# Patient Record
Sex: Female | Born: 1990 | Race: Black or African American | Hispanic: No | Marital: Single | Smoking: Current some day smoker
Health system: Southern US, Community
[De-identification: ages and names within clinical notes are randomized; demographics above are authoritative.]

## PROBLEM LIST (undated history)

## (undated) ENCOUNTER — Emergency Department (HOSPITAL_COMMUNITY): Payer: Self-pay

## (undated) ENCOUNTER — Inpatient Hospital Stay (HOSPITAL_COMMUNITY): Payer: Self-pay

## (undated) DIAGNOSIS — E282 Polycystic ovarian syndrome: Secondary | ICD-10-CM

## (undated) DIAGNOSIS — R0602 Shortness of breath: Secondary | ICD-10-CM

## (undated) DIAGNOSIS — E669 Obesity, unspecified: Secondary | ICD-10-CM

## (undated) DIAGNOSIS — Z789 Other specified health status: Secondary | ICD-10-CM

## (undated) DIAGNOSIS — R600 Localized edema: Secondary | ICD-10-CM

## (undated) DIAGNOSIS — I10 Essential (primary) hypertension: Secondary | ICD-10-CM

## (undated) DIAGNOSIS — G4730 Sleep apnea, unspecified: Secondary | ICD-10-CM

## (undated) DIAGNOSIS — M549 Dorsalgia, unspecified: Secondary | ICD-10-CM

## (undated) DIAGNOSIS — O139 Gestational [pregnancy-induced] hypertension without significant proteinuria, unspecified trimester: Secondary | ICD-10-CM

## (undated) HISTORY — DX: Polycystic ovarian syndrome: E28.2

## (undated) HISTORY — PX: TONSILLECTOMY: SUR1361

## (undated) HISTORY — DX: Localized edema: R60.0

## (undated) HISTORY — DX: Shortness of breath: R06.02

## (undated) HISTORY — DX: Essential (primary) hypertension: I10

## (undated) HISTORY — PX: ADENOIDECTOMY: SUR15

## (undated) HISTORY — DX: Sleep apnea, unspecified: G47.30

## (undated) HISTORY — DX: Dorsalgia, unspecified: M54.9

## (undated) HISTORY — DX: Obesity, unspecified: E66.9

## (undated) HISTORY — DX: Gestational (pregnancy-induced) hypertension without significant proteinuria, unspecified trimester: O13.9

---
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
## 2010-06-06 LAB — WET PREP, GENITAL
Trich, Wet Prep: NONE SEEN
Yeast Wet Prep HPF POC: NONE SEEN

## 2010-06-06 LAB — GC/CHLAMYDIA PROBE AMP, GENITAL
Chlamydia, DNA Probe: POSITIVE — AB
GC Probe Amp, Genital: NEGATIVE

## 2010-06-06 LAB — POCT URINALYSIS DIP (DEVICE)
Glucose, UA: NEGATIVE mg/dL
Hgb urine dipstick: NEGATIVE
Nitrite: NEGATIVE
Protein, ur: 30 mg/dL — AB
Specific Gravity, Urine: >=1.03 (ref 1.005–1.030)
Urobilinogen, UA: 0.2 mg/dL (ref 0.0–1.0)
pH: 5.5 (ref 5.0–8.0)

## 2010-06-06 LAB — POCT PREGNANCY, URINE: Preg Test, Ur: NEGATIVE

## 2010-06-25 LAB — POCT URINALYSIS DIP (DEVICE)
Glucose, UA: NEGATIVE mg/dL
Ketones, ur: 40 mg/dL — AB
Nitrite: NEGATIVE
Protein, ur: 100 mg/dL — AB
Specific Gravity, Urine: >=1.03 (ref 1.005–1.030)
Urobilinogen, UA: 0.2 mg/dL (ref 0.0–1.0)
pH: 5.5 (ref 5.0–8.0)

## 2010-06-25 LAB — POCT PREGNANCY, URINE: Preg Test, Ur: NEGATIVE

## 2010-06-28 LAB — WET PREP, GENITAL
Clue Cells Wet Prep HPF POC: NONE SEEN
Trich, Wet Prep: NONE SEEN
WBC, Wet Prep HPF POC: NONE SEEN
Yeast Wet Prep HPF POC: NONE SEEN

## 2010-06-28 LAB — POCT URINALYSIS DIP (DEVICE)
Glucose, UA: NEGATIVE mg/dL
Ketones, ur: NEGATIVE mg/dL
Nitrite: NEGATIVE
Protein, ur: NEGATIVE mg/dL
Specific Gravity, Urine: 1.025 (ref 1.005–1.030)
Urobilinogen, UA: 0.2 mg/dL (ref 0.0–1.0)
pH: 5.5 (ref 5.0–8.0)

## 2010-06-28 LAB — POCT PREGNANCY, URINE: Preg Test, Ur: NEGATIVE

## 2010-06-28 LAB — GC/CHLAMYDIA PROBE AMP, GENITAL
Chlamydia, DNA Probe: NEGATIVE
GC Probe Amp, Genital: NEGATIVE

## 2010-12-17 ENCOUNTER — Inpatient Hospital Stay (INDEPENDENT_AMBULATORY_CARE_PROVIDER_SITE_OTHER)
Admission: RE | Admit: 2010-12-17 | Discharge: 2010-12-17 | Disposition: A | Discharge status: Home / Self Care | Payer: Self-pay | Source: Ambulatory Visit | Attending: Family Medicine | Admitting: Family Medicine

## 2010-12-17 DIAGNOSIS — N760 Acute vaginitis: Secondary | ICD-10-CM

## 2010-12-17 LAB — WET PREP, GENITAL
Clue Cells Wet Prep HPF POC: NONE SEEN
Yeast Wet Prep HPF POC: NONE SEEN

## 2010-12-20 LAB — GC/CHLAMYDIA PROBE AMP, GENITAL
Chlamydia, DNA Probe: POSITIVE — AB
GC Probe Amp, Genital: NEGATIVE

## 2010-12-28 LAB — POCT URINALYSIS DIP (DEVICE)
Bilirubin Urine: NEGATIVE
Glucose, UA: NEGATIVE
Ketones, ur: NEGATIVE
Nitrite: NEGATIVE
Operator id: 235561
Protein, ur: NEGATIVE
Specific Gravity, Urine: >=1.03
Urobilinogen, UA: 0.2
pH: 5.5

## 2010-12-28 LAB — WET PREP, GENITAL
Clue Cells Wet Prep HPF POC: NONE SEEN
Trich, Wet Prep: NONE SEEN
WBC, Wet Prep HPF POC: NONE SEEN
Yeast Wet Prep HPF POC: NONE SEEN

## 2010-12-28 LAB — POCT PREGNANCY, URINE
Operator id: 235561
Preg Test, Ur: NEGATIVE

## 2010-12-28 LAB — GC/CHLAMYDIA PROBE AMP, GENITAL
Chlamydia, DNA Probe: NEGATIVE
GC Probe Amp, Genital: NEGATIVE

## 2010-12-30 LAB — GC/CHLAMYDIA PROBE AMP, GENITAL
Chlamydia, DNA Probe: POSITIVE — AB
GC Probe Amp, Genital: NEGATIVE

## 2010-12-30 LAB — POCT PREGNANCY, URINE
Operator id: 235561
Preg Test, Ur: NEGATIVE

## 2010-12-30 LAB — WET PREP, GENITAL
Clue Cells Wet Prep HPF POC: NONE SEEN
Trich, Wet Prep: NONE SEEN
Yeast Wet Prep HPF POC: NONE SEEN

## 2010-12-30 LAB — POCT URINALYSIS DIP (DEVICE)
Glucose, UA: NEGATIVE
Hgb urine dipstick: NEGATIVE
Ketones, ur: NEGATIVE
Nitrite: NEGATIVE
Operator id: 235561
Protein, ur: NEGATIVE
Specific Gravity, Urine: 1.02
Urobilinogen, UA: 1
pH: 7

## 2011-09-08 ENCOUNTER — Encounter (HOSPITAL_COMMUNITY): Payer: Self-pay | Admitting: Emergency Medicine

## 2011-09-08 ENCOUNTER — Emergency Department (HOSPITAL_COMMUNITY)
Admission: EM | Admit: 2011-09-08 | Discharge: 2011-09-09 | Disposition: A | Discharge status: Home / Self Care | Payer: Self-pay | Attending: Emergency Medicine | Admitting: Emergency Medicine

## 2011-09-08 DIAGNOSIS — IMO0001 Reserved for inherently not codable concepts without codable children: Secondary | ICD-10-CM | POA: Insufficient documentation

## 2011-09-08 DIAGNOSIS — R110 Nausea: Secondary | ICD-10-CM

## 2011-09-08 DIAGNOSIS — R51 Headache: Secondary | ICD-10-CM | POA: Insufficient documentation

## 2011-09-08 DIAGNOSIS — R519 Headache, unspecified: Secondary | ICD-10-CM

## 2011-09-08 LAB — DIFFERENTIAL
Basophils Absolute: 0 10*3/uL (ref 0.0–0.1)
Basophils Relative: 0 % (ref 0–1)
Eosinophils Absolute: 0 10*3/uL (ref 0.0–0.7)
Eosinophils Relative: 0 % (ref 0–5)
Lymphocytes Relative: 24 % (ref 12–46)
Lymphs Abs: 1.9 10*3/uL (ref 0.7–4.0)
Monocytes Absolute: 0.6 10*3/uL (ref 0.1–1.0)
Monocytes Relative: 7 % (ref 3–12)
Neutro Abs: 5.4 10*3/uL (ref 1.7–7.7)
Neutrophils Relative %: 68 % (ref 43–77)

## 2011-09-08 LAB — BASIC METABOLIC PANEL
BUN: 9 mg/dL (ref 6–23)
CO2: 25 mEq/L (ref 19–32)
Calcium: 9.3 mg/dL (ref 8.4–10.5)
Chloride: 100 mEq/L (ref 96–112)
Creatinine, Ser: 0.8 mg/dL (ref 0.50–1.10)
GFR calc Af Amer: >90 mL/min (ref >90)
GFR calc non Af Amer: >90 mL/min (ref >90)
Glucose, Bld: 92 mg/dL (ref 70–99)
Potassium: 4 mEq/L (ref 3.5–5.1)
Sodium: 136 mEq/L (ref 135–145)

## 2011-09-08 LAB — URINALYSIS, ROUTINE W REFLEX MICROSCOPIC
Bilirubin Urine: NEGATIVE
Glucose, UA: NEGATIVE mg/dL
Hgb urine dipstick: NEGATIVE
Ketones, ur: NEGATIVE mg/dL
Nitrite: NEGATIVE
Protein, ur: NEGATIVE mg/dL
Specific Gravity, Urine: 1.03 (ref 1.005–1.030)
Urobilinogen, UA: 1 mg/dL (ref 0.0–1.0)
pH: 7.5 (ref 5.0–8.0)

## 2011-09-08 LAB — CBC
HCT: 40.2 % (ref 36.0–46.0)
Hemoglobin: 13.5 g/dL (ref 12.0–15.0)
MCH: 28.7 pg (ref 26.0–34.0)
MCHC: 33.6 g/dL (ref 30.0–36.0)
MCV: 85.5 fL (ref 78.0–100.0)
Platelets: 262 10*3/uL (ref 150–400)
RBC: 4.7 MIL/uL (ref 3.87–5.11)
RDW: 13.6 % (ref 11.5–15.5)
WBC: 8 10*3/uL (ref 4.0–10.5)

## 2011-09-08 LAB — POCT PREGNANCY, URINE: Preg Test, Ur: NEGATIVE

## 2011-09-08 LAB — URINE MICROSCOPIC-ADD ON

## 2011-09-08 NOTE — ED Notes (Signed)
Patient C/O having a pounding headache and lightheadedness that began yesterday. States that she has been taking ibuprofen but the headache persists. Her head hurts worse when she stands.  States that she has no history of headaches.  Also C/O periodic, sharp abdominal pain.

## 2011-09-08 NOTE — ED Notes (Signed)
PT. REPORTS HEADACHE / LIGHTHEADED WITH CHILLS AND LOW ABDOMINAL CRAMPING ONSET YESTERDAY.

## 2011-09-08 NOTE — ED Provider Notes (Signed)
History     CSN: [id number]  Arrival date & time 09/08/11  2034   First MD Initiated Contact with Patient 09/08/11 2257      Chief Complaint  Patient presents with  . Headache    (Consider location/radiation/quality/duration/timing/severity/associated sxs/prior treatment) HPI Comments: Patient with a significant past medical history presents emergency department with chief complaint of headache.  Patient states that she normally does not suffer from headaches and she's only had one or 2 migraines before in the past.  Headache onset was gradual E. last evening around 6 p.m. and has been intermittent coming and going ever sense.  Headache is located diffusely throughout head and described as a throbbing sensation rated at a 5/10 in severity.  Headache is non-positional and patient is unaware of any exacerbating factors including lying down.  Patient denies fevers, night sweats, chills, change in vision, chest pain, shortness of breath, lightheadedness, syncope.  In addition patient states that she has had myalgias, chills, and fatigue since yesterday.  She feels as though "I'm coming down with something" patient denies a cough, congestion, or ear pain.  Patient is a 21 y.o. female presenting with headaches. The history is provided by the patient.  Headache  Pertinent negatives include no fever and no shortness of breath.    History reviewed. No pertinent past medical history.  Past Surgical History  Procedure Date  . Tonsillectomy     No family history on file.  History  Substance Use Topics  . Smoking status: Never Smoker   . Smokeless tobacco: Not on file  . Alcohol Use: No    OB History    Grav Para Term Preterm Abortions TAB SAB Ect Mult Living                  Review of Systems  Constitutional: Positive for chills and fatigue. Negative for fever and appetite change.  HENT: Negative for congestion.   Eyes: Negative for visual disturbance.  Respiratory: Negative for  shortness of breath.   Cardiovascular: Negative for chest pain and leg swelling.  Gastrointestinal: Negative for abdominal pain.  Genitourinary: Negative for dysuria, urgency and frequency.  Musculoskeletal: Positive for myalgias.  Neurological: Positive for headaches. Negative for dizziness, syncope, weakness, light-headedness and numbness.  Psychiatric/Behavioral: Negative for confusion.  All other systems reviewed and are negative.    Allergies  Review of patient's allergies indicates no known allergies.  Home Medications   Current Outpatient Rx  Name Route Sig Dispense Refill  . IBUPROFEN 200 MG PO TABS Oral Take 400 mg by mouth every 6 (six) hours as needed. For pain      BP 173/76  Pulse 74  Temp 100.2 F (37.9 C)  Resp 14  SpO2 98%  LMP 08/23/2011  Physical Exam  Constitutional: She is oriented to person, place, and time. She appears well-developed and well-nourished. No distress.  HENT:  Head: Normocephalic and atraumatic.  Mouth/Throat: Oropharynx is clear and moist. No oropharyngeal exudate.  Eyes: Conjunctivae and EOM are normal. Pupils are equal, round, and reactive to light. No scleral icterus.  Neck:       Supple full pain free range of motion of neck, flexion and extension without difficulty, nontender to palpation  Cardiovascular:       Regular rate rhythm, no aberrancy to auscultation  Pulmonary/Chest: Effort normal and breath sounds normal.       Lungs clear auscultation bilaterally.  Abdominal: Soft.  Musculoskeletal: Normal range of motion. She exhibits no edema and  no tenderness.  Neurological: She is alert and oriented to person, place, and time.       Cranial nerves III through XII intact, normal coordination, strength 5/5 bilaterally, gait normal without ataxia  Skin: Skin is warm and dry. No rash noted. She is not diaphoretic. No erythema. No pallor.  Psychiatric: She has a normal mood and affect. Her behavior is normal.    ED Course    Procedures (including critical care time)  Labs Reviewed  URINALYSIS, ROUTINE W REFLEX MICROSCOPIC - Abnormal; Notable for the following:    APPearance CLOUDY (*)     Leukocytes, UA TRACE (*)     All other components within normal limits  URINE MICROSCOPIC-ADD ON - Abnormal; Notable for the following:    Squamous Epithelial / LPF FEW (*)     All other components within normal limits  BASIC METABOLIC PANEL  CBC  DIFFERENTIAL  POCT PREGNANCY, URINE   No results found.   No diagnosis found.    MDM  HA, myalgias  Pt HA treated and improved while in ED.  Presentation is non concerning for [facility], ICH, Meningitis, or temporal arteritis, intermittent with pain free periods. Pt is afebrile with no focal neuro deficits, nuchal rigidity, or change in vision. Pt is to follow up with PCP to discuss prophylactic medication. Pt verbalizes understanding and is agreeable with plan to dc.          Jaci Carrel, [place] 09/09/11 [phone]

## 2011-09-09 MED ORDER — KETOROLAC TROMETHAMINE 60 MG/2ML IM SOLN
60.0000 mg | Freq: Once | INTRAMUSCULAR | Status: AC
  Administered 2011-09-09: 60 mg via INTRAMUSCULAR
  Filled 2011-09-09: qty 2

## 2011-09-09 MED ORDER — METOCLOPRAMIDE HCL 5 MG/ML IJ SOLN
10.0000 mg | Freq: Once | INTRAMUSCULAR | Status: AC
  Administered 2011-09-09: 10 mg via INTRAMUSCULAR
  Filled 2011-09-09: qty 2

## 2011-09-09 NOTE — ED Provider Notes (Signed)
Medical screening examination/treatment/procedure(s) were performed by non-physician practitioner and as supervising physician I was immediately available for consultation/collaboration.   Carleene Cooper III, MD 09/09/11 1346

## 2013-03-24 ENCOUNTER — Emergency Department (HOSPITAL_COMMUNITY): Payer: Self-pay

## 2013-03-24 ENCOUNTER — Emergency Department (HOSPITAL_COMMUNITY)
Admission: EM | Admit: 2013-03-24 | Discharge: 2013-03-24 | Disposition: A | Discharge status: Home / Self Care | Payer: Self-pay | Attending: Emergency Medicine | Admitting: Emergency Medicine

## 2013-03-24 ENCOUNTER — Encounter (HOSPITAL_COMMUNITY): Payer: Self-pay | Admitting: Emergency Medicine

## 2013-03-24 DIAGNOSIS — Z3202 Encounter for pregnancy test, result negative: Secondary | ICD-10-CM | POA: Insufficient documentation

## 2013-03-24 DIAGNOSIS — R51 Headache: Secondary | ICD-10-CM | POA: Insufficient documentation

## 2013-03-24 DIAGNOSIS — H53149 Visual discomfort, unspecified: Secondary | ICD-10-CM | POA: Insufficient documentation

## 2013-03-24 DIAGNOSIS — R350 Frequency of micturition: Secondary | ICD-10-CM | POA: Insufficient documentation

## 2013-03-24 DIAGNOSIS — R109 Unspecified abdominal pain: Secondary | ICD-10-CM | POA: Insufficient documentation

## 2013-03-24 LAB — URINALYSIS W MICROSCOPIC + REFLEX CULTURE
Bilirubin Urine: NEGATIVE
Glucose, UA: NEGATIVE mg/dL
Hgb urine dipstick: NEGATIVE
Ketones, ur: NEGATIVE mg/dL
Leukocytes, UA: NEGATIVE
Nitrite: NEGATIVE
Protein, ur: NEGATIVE mg/dL
Specific Gravity, Urine: 1.027 (ref 1.005–1.030)
Urobilinogen, UA: 1 mg/dL (ref 0.0–1.0)
pH: 7 (ref 5.0–8.0)

## 2013-03-24 LAB — POCT PREGNANCY, URINE: Preg Test, Ur: NEGATIVE

## 2013-03-24 MED ORDER — IBUPROFEN 800 MG PO TABS: 800.0000 mg | ORAL_TABLET | Freq: Three times a day (TID) | ORAL | Status: DC

## 2013-03-24 MED ORDER — CYCLOBENZAPRINE HCL 10 MG PO TABS: 10.0000 mg | ORAL_TABLET | Freq: Two times a day (BID) | ORAL | Status: DC | PRN

## 2013-03-24 NOTE — ED Notes (Signed)
Pt c/o right flank pain that radiates to upper back between shoulder blades and right chest.  "Uncomfortable- sharp feeling".  Denies any nausea/vomiting.  Denies any difficulty with urination.  Migraine headache with light sensitivity on Thursday night prior to symptoms.  Denies headache right now.

## 2013-03-24 NOTE — ED Notes (Signed)
Pt reports R flank pain for past 3 days. Denies dysuria, states pain comes and goes.

## 2013-03-24 NOTE — Discharge Instructions (Signed)
Take ibuprofen for pain. Try heating pads. Rest. Flexeril for spasms. Do not take flexeril if driving or going to work, it may make you drowsy. Follow up with primary care doctor.    Flank Pain Flank pain refers to pain that is located on the side of the body between the upper abdomen and the back. The pain may occur over a short period of time (acute) or may be long-term or reoccurring (chronic). It may be mild or severe. Flank pain can be caused by many things. CAUSES  Some of the more common causes of flank pain include:  Muscle strains.   Muscle spasms.   A disease of your spine (vertebral disk disease).   A lung infection (pneumonia).   Fluid around your lungs (pulmonary edema).   A kidney infection.   Kidney stones.   A very painful skin rash caused by the chickenpox virus (shingles).   Gallbladder disease.  HOME CARE INSTRUCTIONS  Home care will depend on the cause of your pain. In general,  Rest as directed by your caregiver.  Drink enough fluids to keep your urine clear or pale yellow.  Only take over-the-counter or prescription medicines as directed by your caregiver. Some medicines may help relieve the pain.  Tell your caregiver about any changes in your pain.  Follow up with your caregiver as directed. SEEK IMMEDIATE MEDICAL CARE IF:   Your pain is not controlled with medicine.   You have new or worsening symptoms.  Your pain increases.   You have abdominal pain.   You have shortness of breath.   You have persistent nausea or vomiting.   You have swelling in your abdomen.   You feel faint or pass out.   You have blood in your urine.  You have a fever or persistent symptoms for more than 2 3 days.  You have a fever and your symptoms suddenly get worse. MAKE SURE YOU:   Understand these instructions.  Will watch your condition.  Will get help right away if you are not doing well or get worse. Document Released: 04/28/2005  Document Revised: 11/30/2011 Document Reviewed: 10/20/2011 [facility] Patient Information 2014 [place]ExitCare, [place]LL[place].

## 2013-03-24 NOTE — ED Provider Notes (Signed)
CSN: [id number]     Arrival date & time 03/24/13  1300 History   First MD Initiated Contact with Patient 03/24/13 1501     Chief Complaint  Patient presents with  . Flank Pain   (Consider location/radiation/quality/duration/timing/severity/associated sxs/prior Treatment) HPI Monique Bryan is a 23 y.o. female who presents to emergency department complaining of right flank pain. States pain began 3 days ago while standing at work. Denies any injuries. States had an associated headache and photosensitivity. States pain persisted over last three days, although today feels slightly better. It is not worsened by movement or eating. It radiates into right upper back and right shoulder. She denies nausea, vomiting, chest pain, abdominal pain. No fever. No urinary urgency or dysuria, however stats she has been urinating more than usual. States pain is somewhat worsened with deep breathing and coughing. No abnormal vaginal discharge or bleeding. States she has been taking ibuprofen that helps her pain. No hx of the same pain. Headache now resolved.   History reviewed. No pertinent past medical history. Past Surgical History  Procedure Laterality Date  . Tonsillectomy     History reviewed. No pertinent family history. History  Substance Use Topics  . Smoking status: Never Smoker   . Smokeless tobacco: Not on file  . Alcohol Use: No   OB History   Grav Para Term Preterm Abortions TAB SAB Ect Mult Living                 Review of Systems  Constitutional: Negative for fever and chills.  HENT: Negative for congestion and ear discharge.   Respiratory: Negative for cough, chest tightness and shortness of breath.   Cardiovascular: Negative for chest pain, palpitations and leg swelling.  Gastrointestinal: Negative for nausea, vomiting, abdominal pain and diarrhea.  Genitourinary: Positive for frequency and flank pain. Negative for dysuria, hematuria, vaginal bleeding, vaginal discharge, vaginal  pain and pelvic pain.  Musculoskeletal: Negative for arthralgias, myalgias, neck pain and neck stiffness.  Skin: Negative for rash.  Neurological: Positive for headaches. Negative for dizziness, weakness, light-headedness and numbness.  All other systems reviewed and are negative.    Allergies  Review of patient's allergies indicates no known allergies.  Home Medications   Current Outpatient Rx  Name  Route  Sig  Dispense  Refill  . ibuprofen (ADVIL,MOTRIN) 200 MG tablet   Oral   Take 800 mg by mouth every 6 (six) hours as needed for moderate pain. For pain          BP 163/68  Pulse 72  Temp(Src) 98.4 F (36.9 C) (Oral)  Resp 20  SpO2 100% Physical Exam  Nursing note and vitals reviewed. Constitutional: She appears well-developed and well-nourished. No distress.  HENT:  Head: Normocephalic.  Eyes: Conjunctivae are normal.  Neck: Neck supple.  Cardiovascular: Normal rate, regular rhythm and normal heart sounds.   Pulmonary/Chest: Effort normal and breath sounds normal. No respiratory distress. She has no wheezes. She has no rales.  Abdominal: Soft. Bowel sounds are normal. She exhibits no distension. There is no tenderness. There is no rebound and no guarding.  Right CVA tenderness  Musculoskeletal: She exhibits no edema.  Neurological: She is alert.  Skin: Skin is warm and dry.  Psychiatric: She has a normal mood and affect. Her behavior is normal.    ED Course  Procedures (including critical care time) Labs Review Labs Reviewed  URINALYSIS W MICROSCOPIC + REFLEX CULTURE   Imaging Review No results found.  EKG Interpretation  None       MDM   1. Flank pain     Pt with right flank pain for several days. Urinary frequency. Pain with respirations. She is non toxic appearing. PERC negative, doubt PT. Normal VS except for htn, will recheck. Will send UA, urine preg, CXR.    4:31 PM CXR negative. UA clear. Will send cultures. Urine preg negative. She  has no abdominal pain or tenderness, doubt cholelithiasis or any other abdominal pathology.  Discussed findings with pt. I suspect this is most likely muscle spasm vs possible kidney stone. At this time pt is pain free. She deferred CT scan for now. Will try NSAIDs, muscle relaxant. Follow up as needed of if change in symptoms.   Filed Vitals:   03/24/13 1318  BP: 163/68  Pulse: 72  Temp: 98.4 F (36.9 C)  TempSrc: Oral  Resp: 20  SpO2: 100%      Alessandra Sawdey A Yarelie Hams, PA-C 03/24/13 1640

## 2013-04-03 NOTE — ED Provider Notes (Signed)
Medical screening examination/treatment/procedure(s) were performed by non-physician practitioner and as supervising physician I was immediately available for consultation/collaboration.  EKG Interpretation   None         EKG Interpretation   None         Rolland PorterMark Crystle Carelli, MD 04/03/13 [phone]

## 2013-07-11 ENCOUNTER — Encounter (HOSPITAL_COMMUNITY): Payer: Self-pay | Admitting: Emergency Medicine

## 2013-07-11 ENCOUNTER — Emergency Department (HOSPITAL_COMMUNITY)
Admission: EM | Admit: 2013-07-11 | Discharge: 2013-07-11 | Disposition: A | Discharge status: Home / Self Care | Payer: Self-pay | Attending: Emergency Medicine | Admitting: Emergency Medicine

## 2013-07-11 DIAGNOSIS — B9789 Other viral agents as the cause of diseases classified elsewhere: Secondary | ICD-10-CM | POA: Insufficient documentation

## 2013-07-11 DIAGNOSIS — F17200 Nicotine dependence, unspecified, uncomplicated: Secondary | ICD-10-CM | POA: Insufficient documentation

## 2013-07-11 DIAGNOSIS — Z79899 Other long term (current) drug therapy: Secondary | ICD-10-CM | POA: Insufficient documentation

## 2013-07-11 DIAGNOSIS — B349 Viral infection, unspecified: Secondary | ICD-10-CM

## 2013-07-11 DIAGNOSIS — J029 Acute pharyngitis, unspecified: Secondary | ICD-10-CM

## 2013-07-11 LAB — RAPID STREP SCREEN (MED CTR MEBANE ONLY): Streptococcus, Group A Screen (Direct): NEGATIVE

## 2013-07-11 MED ORDER — HYDROCODONE-ACETAMINOPHEN 7.5-325 MG/15ML PO SOLN: 15.0000 mL | Freq: Three times a day (TID) | ORAL | Status: DC | PRN

## 2013-07-11 MED ORDER — GI COCKTAIL ~~LOC~~
30.0000 mL | Freq: Once | ORAL | Status: AC
  Administered 2013-07-11: 30 mL via ORAL
  Filled 2013-07-11: qty 30

## 2013-07-11 MED ORDER — DEXAMETHASONE SODIUM PHOSPHATE 10 MG/ML IJ SOLN
10.0000 mg | Freq: Once | INTRAMUSCULAR | Status: AC
  Administered 2013-07-11: 10 mg via INTRAMUSCULAR
  Filled 2013-07-11: qty 1

## 2013-07-11 NOTE — ED Notes (Signed)
Assumed care of patient Patient with c/o sore throat and throat pain. Patient able to speak in full and complete sentences without difficulty, handles secretions  No SOB/DOE noted Patient in NAD Awaiting provider Call bell in reach

## 2013-07-11 NOTE — Discharge Instructions (Signed)
Viral Infections A virus is a type of germ. Viruses can cause:  Minor sore throats.  Aches and pains.  Headaches.  Runny nose.  Rashes.  Watery eyes.  Tiredness.  Coughs.  Loss of appetite.  Feeling sick to your stomach (nausea).  Throwing up (vomiting).  Watery poop (diarrhea). HOME CARE   Only take medicines as told by your doctor.  Drink enough water and fluids to keep your pee (urine) clear or pale yellow. Sports drinks are a good choice.  Get plenty of rest and eat healthy. Soups and broths with crackers or rice are fine. GET HELP RIGHT AWAY IF:   You have a very bad headache.  You have shortness of breath.  You have chest pain or neck pain.  You have an unusual rash.  You cannot stop throwing up.  You have watery poop that does not stop.  You cannot keep fluids down.  You or your child has a temperature by mouth above 102 F (38.9 C), not controlled by medicine.  Your baby is older than 3 months with a rectal temperature of 102 F (38.9 C) or higher.  Your baby is 253 months old or younger with a rectal temperature of 100.4 F (38 C) or higher. MAKE SURE YOU:   Understand these instructions.  Will watch this condition.  Will get help right away if you are not doing well or get worse. Document Released: 02/18/2008 Document Revised: 05/30/2011 Document Reviewed: 07/13/2010 [facility]Exit[facility] Patient Information 2014 [place]ExitCare, [place]LLC. Viral Pharyngitis Viral pharyngitis is a viral infection that produces redness, pain, and swelling (inflammation) of the throat. It can spread from person to person (contagious). CAUSES Viral pharyngitis is caused by inhaling a large amount of certain germs called viruses. Many different viruses cause viral pharyngitis. SYMPTOMS Symptoms of viral pharyngitis include:  Sore throat.  Tiredness.  Stuffy nose.  Low-grade fever.  Congestion.  Cough. TREATMENT Treatment includes rest, drinking plenty of fluids, and  the use of over-the-counter medication (approved by your caregiver). HOME CARE INSTRUCTIONS   Drink enough fluids to keep your urine clear or pale yellow.  Eat soft, cold foods such as ice cream, frozen ice pops, or gelatin dessert.  Gargle with warm salt water (1 tsp salt per 1 qt of water).  If over age 747, throat lozenges may be used safely.  Only take over-the-counter or prescription medicines for pain, discomfort, or fever as directed by your caregiver. Do not take aspirin. To help prevent spreading viral pharyngitis to others, avoid:  Mouth-to-mouth contact with others.  Sharing utensils for eating and drinking.  Coughing around others. SEEK MEDICAL CARE IF:   You are better in a few days, then become worse.  You have a fever or pain not helped by pain medicines.  There are any other changes that concern you. Document Released: 12/15/2004 Document Revised: 05/30/2011 Document Reviewed: 05/13/2010 [facility]Exit[facility] Patient Information 2014 [place]ExitCare, [place]LLC. Salt Water Gargle This solution will help make your mouth and throat feel better. HOME CARE INSTRUCTIONS   Mix 1 teaspoon of salt in 8 ounces of warm water.  Gargle with this solution as much or often as you need or as directed. Swish and gargle gently if you have any sores or wounds in your mouth.  Do not swallow this mixture. Document Released: 12/10/2003 Document Revised: 05/30/2011 Document Reviewed: 05/02/2008 [facility]ExitCare Patient Information 2014 [place]ifaciusExitCare, [place]LLC.

## 2013-07-11 NOTE — ED Notes (Signed)
Pt c/o throat swelling and pain onset 2 days ago with chills. +nausea.

## 2013-07-11 NOTE — ED Provider Notes (Signed)
CSN: [id number]     Arrival date & time 07/11/13  0245 History   First MD Initiated Contact with Patient 07/11/13 0344     Chief Complaint  Patient presents with  . Oral Swelling    (Consider location/radiation/quality/duration/timing/severity/associated sxs/prior Treatment) Patient is a 23 y.o. female presenting with pharyngitis. The history is provided by the patient. No language interpreter was used.  Sore Throat This is a new problem. The current episode started yesterday. The problem occurs constantly. The problem has been gradually worsening. Associated symptoms include congestion, coughing, fatigue, nausea, a sore throat, swollen glands and vomiting (NB/NBx1). Pertinent negatives include no abdominal pain, fever or rash. The symptoms are aggravated by swallowing. Treatments tried: DayQuil. The treatment provided no relief.    History reviewed. No pertinent past medical history. Past Surgical History  Procedure Laterality Date  . Tonsillectomy     No family history on file. History  Substance Use Topics  . Smoking status: Current Some Day Smoker  . Smokeless tobacco: Not on file  . Alcohol Use: No   OB History   Grav Para Term Preterm Abortions TAB SAB Ect Mult Living                 Review of Systems  Constitutional: Positive for fatigue. Negative for fever.  HENT: Positive for congestion, rhinorrhea and sore throat. Negative for drooling and trouble swallowing.   Respiratory: Positive for cough. Negative for shortness of breath.   Gastrointestinal: Positive for nausea and vomiting (NB/NBx1). Negative for abdominal pain.  Skin: Negative for rash.  All other systems reviewed and are negative.     Allergies  Review of patient's allergies indicates no known allergies.  Home Medications   Prior to Admission medications   Medication Sig Start Date End Date Taking? Authorizing Provider  DM-Doxylamine-Acetaminophen (VICKS NYQUIL COLD & FLU PO) Take 1 capsule by mouth  2 (two) times daily.   Yes Historical Provider, MD  Phenyleph-CPM-DM-Aspirin (ALKA-SELTZER PLUS COLD & COUGH PO) Take 15 mLs by mouth 2 (two) times daily.   Yes Historical Provider, MD  HYDROcodone-acetaminophen (HYCET) 7.5-325 mg/15 ml solution Take 15 mLs by mouth every 8 (eight) hours as needed for moderate pain. 07/11/13   Antony MaduraKelly Malai Lady, PA-C   BP 159/97  Pulse 102  Temp(Src) 98.2 F (36.8 C) (Oral)  Resp 18  Ht 5\' 9"  (1.753 m)  Wt 330 lb (149.687 kg)  BMI 48.71 kg/m2  SpO2 99%  LMP 06/11/2013  Physical Exam  Nursing note and vitals reviewed. Constitutional: She is oriented to person, place, and time. She appears well-developed and well-nourished. No distress.  HENT:  Head: Normocephalic and atraumatic.  Right Ear: Hearing, tympanic membrane, external ear and ear canal normal.  Left Ear: Hearing, tympanic membrane, external ear and ear canal normal.  Mouth/Throat: Uvula is midline and mucous membranes are normal. No trismus in the jaw. No uvula swelling. Posterior oropharyngeal erythema present. No oropharyngeal exudate or posterior oropharyngeal edema.  Audible congestion without rhinorrhea.  Eyes: Conjunctivae and EOM are normal. Pupils are equal, round, and reactive to light. No scleral icterus.  Neck: Normal range of motion. Neck supple.  Cardiovascular: Normal rate, regular rhythm and normal heart sounds.   Pulmonary/Chest: Effort normal and breath sounds normal. No stridor. No respiratory distress. She has no wheezes. She has no rales.  Musculoskeletal: Normal range of motion.  Lymphadenopathy:    She has cervical adenopathy (Bilateral anterior cervical).  Neurological: She is alert and oriented to person, place, and  time.  Skin: Skin is warm and dry. No rash noted. She is not diaphoretic. No erythema. No pallor.  Psychiatric: She has a normal mood and affect. Her behavior is normal.    ED Course  Procedures (including critical care time) Labs Review Labs Reviewed   RAPID STREP SCREEN  CULTURE, GROUP A STREP    Imaging Review No results found.   EKG Interpretation None      MDM   Final diagnoses:  Viral illness  Pharyngitis   Pt afebrile without tonsillar exudate, negative strep. Presents with mild cervical lymphadenopathy and dysphagia; diagnosis of viral pharyngitis. No abx indicated. DC w symptomatic tx for pain  Pt does not appear dehydrated, but did discuss importance of water rehydration. Presentation non concerning for PTA or infxn spread to soft tissue. No trismus or uvula deviation. Patient tolerating secretions without difficulty. No stridor and patient protecting airway. Specific return precautions discussed. Recommended PCP follow up. Patient agreeable to plan with no unaddressed concerns.   Filed Vitals:   07/11/13 0249 07/11/13 0250  BP:  159/97  Pulse: 102   Temp: 98.2 F (36.8 C)   TempSrc: Oral   Resp: 18   Height: 5\' 9"  (1.753 m)   Weight: 330 lb (149.687 kg)   SpO2: 99%       Antony MaduraKelly Twilla Khouri, PA-C 07/11/13 0502

## 2013-07-11 NOTE — ED Provider Notes (Signed)
Medical screening examination/treatment/procedure(s) were performed by non-physician practitioner and as supervising physician I was immediately available for consultation/collaboration.   EKG Interpretation None       Loralee Weitzman M Ilene Witcher, MD 07/11/13 0559 

## 2013-07-12 LAB — CULTURE, GROUP A STREP

## 2013-10-09 ENCOUNTER — Inpatient Hospital Stay (HOSPITAL_COMMUNITY)
Admission: AD | Admit: 2013-10-09 | Discharge: 2013-10-09 | Discharge status: Against Medical Advice | Payer: Self-pay | Source: Ambulatory Visit | Attending: Obstetrics & Gynecology | Admitting: Obstetrics & Gynecology

## 2013-10-09 DIAGNOSIS — Z3202 Encounter for pregnancy test, result negative: Secondary | ICD-10-CM | POA: Insufficient documentation

## 2013-10-09 DIAGNOSIS — R109 Unspecified abdominal pain: Secondary | ICD-10-CM | POA: Insufficient documentation

## 2013-10-09 DIAGNOSIS — N898 Other specified noninflammatory disorders of vagina: Secondary | ICD-10-CM | POA: Insufficient documentation

## 2013-10-09 LAB — URINALYSIS, ROUTINE W REFLEX MICROSCOPIC
Bilirubin Urine: NEGATIVE
Glucose, UA: NEGATIVE mg/dL
Ketones, ur: NEGATIVE mg/dL
Leukocytes, UA: NEGATIVE
Nitrite: NEGATIVE
Protein, ur: NEGATIVE mg/dL
Specific Gravity, Urine: >1.03 — ABNORMAL HIGH (ref 1.005–1.030)
Urobilinogen, UA: 0.2 mg/dL (ref 0.0–1.0)
pH: 5.5 (ref 5.0–8.0)

## 2013-10-09 LAB — URINE MICROSCOPIC-ADD ON

## 2013-10-09 LAB — POCT PREGNANCY, URINE: Preg Test, Ur: NEGATIVE

## 2013-10-09 NOTE — MAU Note (Signed)
Patient states she came with her friend and her grandmother needs her car. States all she wanted was to know if she was pregnant. Signed AMA papers and left.

## 2013-10-09 NOTE — MAU Note (Signed)
Patient states she has been having abdominal cramping and nausea for about one week. White vaginal discharge, no odor. Denies bleeding.

## 2014-05-28 ENCOUNTER — Emergency Department (HOSPITAL_COMMUNITY)
Admission: EM | Admit: 2014-05-28 | Discharge: 2014-05-28 | Disposition: A | Discharge status: Home / Self Care | Payer: Self-pay | Attending: Emergency Medicine | Admitting: Emergency Medicine

## 2014-05-28 ENCOUNTER — Encounter (HOSPITAL_COMMUNITY): Payer: Self-pay | Admitting: Emergency Medicine

## 2014-05-28 DIAGNOSIS — M545 Low back pain, unspecified: Secondary | ICD-10-CM

## 2014-05-28 DIAGNOSIS — Z79899 Other long term (current) drug therapy: Secondary | ICD-10-CM | POA: Insufficient documentation

## 2014-05-28 DIAGNOSIS — B9689 Other specified bacterial agents as the cause of diseases classified elsewhere: Secondary | ICD-10-CM

## 2014-05-28 DIAGNOSIS — R109 Unspecified abdominal pain: Secondary | ICD-10-CM

## 2014-05-28 DIAGNOSIS — R350 Frequency of micturition: Secondary | ICD-10-CM | POA: Insufficient documentation

## 2014-05-28 DIAGNOSIS — Z3202 Encounter for pregnancy test, result negative: Secondary | ICD-10-CM | POA: Insufficient documentation

## 2014-05-28 DIAGNOSIS — N760 Acute vaginitis: Secondary | ICD-10-CM | POA: Insufficient documentation

## 2014-05-28 DIAGNOSIS — Z720 Tobacco use: Secondary | ICD-10-CM | POA: Insufficient documentation

## 2014-05-28 DIAGNOSIS — E669 Obesity, unspecified: Secondary | ICD-10-CM | POA: Insufficient documentation

## 2014-05-28 LAB — URINALYSIS, ROUTINE W REFLEX MICROSCOPIC
Bilirubin Urine: NEGATIVE
Glucose, UA: NEGATIVE mg/dL
Hgb urine dipstick: NEGATIVE
Ketones, ur: NEGATIVE mg/dL
Leukocytes, UA: NEGATIVE
Nitrite: NEGATIVE
Protein, ur: NEGATIVE mg/dL
Specific Gravity, Urine: 1.026 (ref 1.005–1.030)
Urobilinogen, UA: 0.2 mg/dL (ref 0.0–1.0)
pH: 5.5 (ref 5.0–8.0)

## 2014-05-28 LAB — WET PREP, GENITAL
Trich, Wet Prep: NONE SEEN
WBC, Wet Prep HPF POC: NONE SEEN
Yeast Wet Prep HPF POC: NONE SEEN

## 2014-05-28 LAB — POC URINE PREG, ED: Preg Test, Ur: NEGATIVE

## 2014-05-28 MED ORDER — NAPROXEN 500 MG PO TABS: 500.0000 mg | ORAL_TABLET | Freq: Two times a day (BID) | ORAL | Status: DC | PRN

## 2014-05-28 MED ORDER — HYDROCODONE-ACETAMINOPHEN 5-325 MG PO TABS: 1.0000 | ORAL_TABLET | Freq: Four times a day (QID) | ORAL | Status: DC | PRN

## 2014-05-28 MED ORDER — METRONIDAZOLE 500 MG PO TABS: 500.0000 mg | ORAL_TABLET | Freq: Two times a day (BID) | ORAL | Status: DC

## 2014-05-28 MED ORDER — HYDROCODONE-ACETAMINOPHEN 5-325 MG PO TABS
1.0000 | ORAL_TABLET | Freq: Once | ORAL | Status: AC
  Administered 2014-05-28: 1 via ORAL
  Filled 2014-05-28: qty 1

## 2014-05-28 NOTE — Discharge Instructions (Signed)
Abdominal pain: since you're not having pain today, it's difficult to say exactly what's causing it. It could be related to gas or constipation, or premenstrual pain from ovulation. Your vaginal swabs did show bacterial vaginosis therefore take flagyl as directed and don't drink alcohol. Abdominal (belly) pain can be caused by many things. Your caregiver performed an examination and possibly ordered blood/urine tests and imaging (CT scan, x-rays, ultrasound). Many cases can be observed and treated at home after initial evaluation in the emergency department. Even though you are being discharged home, abdominal pain can be unpredictable. Therefore, you need a repeated exam if your pain does not resolve, returns, or worsens. Most patients with abdominal pain don't have to be admitted to the hospital or have surgery, but serious problems like appendicitis and gallbladder attacks can start out as nonspecific pain. Many abdominal conditions cannot be diagnosed in one visit, so follow-up evaluations are very important. SEEK IMMEDIATE MEDICAL ATTENTION IF YOU DEVELOP ANY OF THE FOLLOWING SYMPTOMS:  The pain does not go away or becomes severe.   A temperature above 101 develops.   Repeated vomiting occurs (multiple episodes).   The pain becomes localized to portions of the abdomen. The right side could possibly be appendicitis. In an adult, the left lower portion of the abdomen could be colitis or diverticulitis.   Blood is being passed in stools or vomit (bright red or black tarry stools).   Return also if you develop chest pain, difficulty breathing, dizziness or fainting, or become confused, poorly responsive, or inconsolable (young children).  The constipation stays for more than 4 days.   There is belly (abdominal) or rectal pain.  You do not seem to be getting better.    Back Pain:  Your back pain should be treated with medicines such as ibuprofen or naprosyn and this back pain should get better  over the next 2 weeks.  However if you develop severe or worsening pain, low back pain with fever, numbness, weakness or inability to walk or urinate, you should return to the ER immediately.  Please follow up with your doctor this week for a recheck if still having symptoms.  Low back pain is discomfort in the lower back that may be due to injuries to muscles and ligaments around the spine.  Occasionally, it may be caused by a a problem to a part of the spine called a disc.  The pain may last several days or a week;  However, most patients get completely well in 4 weeks.  Self - care:  The application of heat can help soothe the pain.  Maintaining your daily activities, including walking, is encourged, as it will help you get better faster than just staying in bed. Perform gentle stretching as discussed. Drink plenty of fluids. Avoid heavy lifting or twisting.  Medications are also useful to help with pain control.  A commonly prescribed medication includes norco.  Do not drive or operate heavy machinery while taking this medication.  Non steroidal anti inflammatory medications including Ibuprofen and naproxen;  These medications help both pain and swelling and are very useful in treating back pain.  They should be taken with food, as they can cause stomach upset, and more seriously, stomach bleeding.    SEEK IMMEDIATE MEDICAL ATTENTION IF: New numbness, tingling, weakness, or problem with the use of your arms or legs.  Severe back pain not relieved with medications.  Difficulty with or loss of control of your bowel or bladder control.  Increasing  pain in any areas of the body (such as chest or abdominal pain).  Shortness of breath, dizziness or fainting.  Nausea (feeling sick to your stomach), vomiting, fever, or sweats.  You will need to follow up with  Your primary healthcare provider in 1-2 weeks for reassessment.   Back Pain, Adult Low back pain is very common. About 1 in 5 people have  back pain.The cause of low back pain is rarely dangerous. The pain often gets better over time.About half of people with a sudden onset of back pain feel better in just 2 weeks. About 8 in 10 people feel better by 6 weeks.  CAUSES Some common causes of back pain include:  Strain of the muscles or ligaments supporting the spine.  Wear and tear (degeneration) of the spinal discs.  Arthritis.  Direct injury to the back. DIAGNOSIS Most of the time, the direct cause of low back pain is not known.However, back pain can be treated effectively even when the exact cause of the pain is unknown.Answering your caregiver's questions about your overall health and symptoms is one of the most accurate ways to make sure the cause of your pain is not dangerous. If your caregiver needs more information, he or she may order lab work or imaging tests (X-rays or MRIs).However, even if imaging tests show changes in your back, this usually does not require surgery. HOME CARE INSTRUCTIONS For many people, back pain returns.Since low back pain is rarely dangerous, it is often a condition that people can learn to [facility] their own.   Remain active. It is stressful on the back to sit or stand in one place. Do not sit, drive, or stand in one place for more than 30 minutes at a time. Take short walks on level surfaces as soon as pain allows.Try to increase the length of time you walk each day.  Do not stay in bed.Resting more than 1 or 2 days can delay your recovery.  Do not avoid exercise or work.Your body is made to move.It is not dangerous to be active, even though your back may hurt.Your back will likely heal faster if you return to being active before your pain is gone.  Pay attention to your body when you bend and lift. Many people have less discomfortwhen lifting if they bend their knees, keep the load close to their bodies,and avoid twisting. Often, the most comfortable positions are those that put  less stress on your recovering back.  Find a comfortable position to sleep. Use a firm mattress and lie on your side with your knees slightly bent. If you lie on your back, put a pillow under your knees.  Only take over-the-counter or prescription medicines as directed by your caregiver. Over-the-counter medicines to reduce pain and inflammation are often the most helpful.Your caregiver may prescribe muscle relaxant drugs.These medicines help dull your pain so you can more quickly return to your normal activities and healthy exercise.  Put ice on the injured area.  Put ice in a plastic bag.  Place a towel between your skin and the bag.  Leave the ice on for 15-20 minutes, 03-04 times a day for the first 2 to 3 days. After that, ice and heat may be alternated to reduce pain and spasms.  Ask your caregiver about trying back exercises and gentle massage. This may be of some benefit.  Avoid feeling anxious or stressed.Stress increases muscle tension and can worsen back pain.It is important to recognize when you are anxious  or stressed and learn ways to manage it.Exercise is a great option. SEEK MEDICAL CARE IF:  You have pain that is not relieved with rest or medicine.  You have pain that does not improve in 1 week.  You have new symptoms.  You are generally not feeling well. SEEK IMMEDIATE MEDICAL CARE IF:   You have pain that radiates from your back into your legs.  You develop new bowel or bladder control problems.  You have unusual weakness or numbness in your arms or legs.  You develop nausea or vomiting.  You develop abdominal pain.  You feel faint. Document Released: 03/07/2005 Document Revised: 09/06/2011 Document Reviewed: 07/09/2013 [facility] Patient Information 2015 [place], [place]. This information is not intended to replace advice given to you by your health care provider. Make sure you discuss any questions you have with your health care provider.  Back Injury  Prevention The following tips can help you to prevent a back injury. PHYSICAL FITNESS  Exercise often. Try to develop strong stomach (abdominal) muscles.  Do aerobic exercises often. This includes walking, jogging, biking, swimming.  Do exercises that help with balance and strength often. This includes tai chi and yoga.  Stretch before and after you exercise.  Keep a healthy weight. DIET   Ask your doctor how much calcium and vitamin D you need every day.  Include calcium in your diet. Foods high in calcium include dairy products; green, leafy vegetables; and products with calcium added (fortified).  Include vitamin D in your diet. Foods high in vitamin D include milk and products with vitamin D added.  Think about taking a multivitamin or other nutritional products called " supplements."  Stop smoking if you smoke. POSTURE   Sit and stand up straight. Avoid leaning forward or hunching over.  Choose chairs that support your lower back.  If you work at a desk:  Sit close to your work so you do not lean over.  Keep your chin tucked in.  Keep your neck drawn back.  Keep your elbows bent at a right angle. Your arms should look like the letter "L."  Sit high and close to the steering wheel when you drive. Add low back support to your car seat if needed.  Avoid sitting or standing in one position for too long. Get up and move around every hour. Take breaks if you are driving for a long time.  Sleep on your side with your knees slightly bent. You can also sleep on your back with a pillow under your knees. Do not sleep on your stomach. LIFTING, TWISTING, AND REACHING  Avoid heavy lifting, especially lifting over and over again. If you must do heavy lifting:  Stretch before lifting.  Work slowly.  Rest between lifts.  Use carts and dollies to move objects when possible.  Make several small trips instead of carrying 1 heavy load.  Ask for help when you need it.  Ask  for help when moving big, awkward objects.  Follow these steps when lifting:  Stand with your feet shoulder-width apart.  Get as close to the object as you can. Do not pick up heavy objects that are far from your body.  Use handles or lifting straps when possible.  Bend at your knees. Squat down, but keep your heels off the floor.  Keep your shoulders back, your chin tucked in, and your back straight.  Lift the object slowly. Tighten the muscles in your legs, stomach, and butt. Keep the object as  close to the center of your body as possible.  Reverse these directions when you put a load down.  Do not:  Lift the object above your waist.  Twist at the waist while lifting or carrying a load. Move your feet if you need to turn, not your waist.  Bend over without bending at your knees.  Avoid reaching over your head, across a table, or for an object on a high surface. OTHER TIPS  Avoid wet floors and keep sidewalks clear of ice.  Do not sleep on a mattress that is too soft or too hard.  Keep items that you use often within easy reach.  Put heavier objects on shelves at waist level. Put lighter objects on lower or higher shelves.  Find ways to lessen your stress. You can try exercise, massage, or relaxation.  Get help for depression or anxiety if needed. GET HELP IF:  You injure your back.  You have questions about diet, exercise, or other ways to prevent back injuries. MAKE SURE YOU:  Understand these instructions.  Will watch your condition.  Will get help right away if you are not doing well or get worse. Document Released: 08/24/2007 Document Revised: 05/30/2011 Document Reviewed: 04/18/2011 [facility] Patient Information 2015 [place], [place]. This information is not intended to replace advice given to you by your health care provider. Make sure you discuss any questions you have with your health care provider.  Back Exercises Back exercises help treat and prevent  back injuries. The goal is to increase your strength in your belly (abdominal) and back muscles. These exercises can also help with flexibility. Start these exercises when told by your doctor. HOME CARE Back exercises include: Pelvic Tilt.  Lie on your back with your knees bent. Tilt your pelvis until the lower part of your back is against the floor. Hold this position 5 to 10 sec. Repeat this exercise 5 to 10 times. Knee to Chest.  Pull 1 knee up against your chest and hold for 20 to 30 seconds. Repeat this with the other knee. This may be done with the other leg straight or bent, whichever feels better. Then, pull both knees up against your chest. Sit-Ups or Curl-Ups.  Bend your knees 90 degrees. Start with tilting your pelvis, and do a partial, slow sit-up. Only lift your upper half 30 to 45 degrees off the floor. Take at least 2 to 3 seonds for each sit-up. Do not do sit-ups with your knees out straight. If partial sit-ups are difficult, simply do the above but with only tightening your belly (abdominal) muscles and holding it as told. Hip-Lift.  Lie on your back with your knees flexed 90 degrees. Push down with your feet and shoulders as you raise your hips 2 inches off the floor. Hold for 10 seconds, repeat 5 to 10 times. Back Arches.  Lie on your stomach. Prop yourself up on bent elbows. Slowly press on your hands, causing an arch in your low back. Repeat 3 to 5 times. Shoulder-Lifts.  Lie face down with arms beside your body. Keep hips and belly pressed to floor as you slowly lift your head and shoulders off the floor. Do not overdo your exercises. Be careful in the beginning. Exercises may cause you some mild back discomfort. If the pain lasts for more than 15 minutes, stop the exercises until you see your doctor. Improvement with exercise for back problems is slow.  Document Released: 04/09/2010 Document Revised: 05/30/2011 Document Reviewed: 01/06/2011 [employer] Patient  Information  2015 [place], [place]. This information is not intended to replace advice given to you by your health care provider. Make sure you discuss any questions you have with your health care provider.  Abdominal Pain Many things can cause belly (abdominal) pain. Most times, the belly pain is not dangerous. Many cases of belly pain can be watched and treated at home. HOME CARE   Do not take medicines that help you go poop (laxatives) unless told to by your doctor.  Only take medicine as told by your doctor.  Eat or drink as told by your doctor. Your doctor will tell you if you should be on a special diet. GET HELP IF:  You do not know what is causing your belly pain.  You have belly pain while you are sick to your stomach (nauseous) or have runny poop (diarrhea).  You have pain while you pee or poop.  Your belly pain wakes you up at night.  You have belly pain that gets worse or better when you eat.  You have belly pain that gets worse when you eat fatty foods.  You have a fever. GET HELP RIGHT AWAY IF:   The pain does not go away within 2 hours.  You keep throwing up (vomiting).  The pain changes and is only in the right or left part of the belly.  You have bloody or tarry looking poop. MAKE SURE YOU:   Understand these instructions.  Will watch your condition.  Will get help right away if you are not doing well or get worse. Document Released: 08/24/2007 Document Revised: 03/12/2013 Document Reviewed: 11/14/2012 [facility] Patient Information 2015 [place], [place]. This information is not intended to replace advice given to you by your health care provider. Make sure you discuss any questions you have with your health care provider.  Heat Therapy Heat therapy can help make painful, stiff muscles and joints feel better. Do not use heat on new injuries. Wait at least 48 hours after an injury to use heat. Do not use heat when you have aches or pains right after an activity. If you  still have pain 3 hours after stopping the activity, then you may use heat. HOME CARE Wet heat pack  Soak a clean towel in warm water. Squeeze out the extra water.  Put the warm, wet towel in a plastic bag.  Place a thin, dry towel between your skin and the bag.  Put the heat pack on the area for 5 minutes, and check your skin. Your skin may be pink, but it should not be red.  Leave the heat pack on the area for 15 to 30 minutes.  Repeat this every 2 to 4 hours while awake. Do not use heat while you are sleeping. Warm water bath  Fill a tub with warm water.  Place the affected body part in the tub.  Soak the area for 20 to 40 minutes.  Repeat as needed. Hot water bottle  Fill the water bottle half full with hot water.  Press out the extra air. Close the cap tightly.  Place a dry towel between your skin and the bottle.  Put the bottle on the area for 5 minutes, and check your skin. Your skin may be pink, but it should not be red.  Leave the bottle on the area for 15 to 30 minutes.  Repeat this every 2 to 4 hours while awake. Electric heating pad  Place a dry towel between your skin and  the heating pad.  Set the heating pad on low heat.  Put the heating pad on the area for 10 minutes, and check your skin. Your skin may be pink, but it should not be red.  Leave the heating pad on the area for 20 to 40 minutes.  Repeat this every 2 to 4 hours while awake.  Do not lie on the heating pad.  Do not fall asleep while using the heating pad.  Do not use the heating pad near water. GET HELP RIGHT AWAY IF:  You get blisters or red skin.  Your skin is puffy (swollen), or you lose feeling (numbness) in the affected area.  You have any new problems.  Your problems are getting worse.  You have any questions or concerns. If you have any problems, stop using heat therapy until you see your doctor. MAKE SURE YOU:  Understand these instructions.  Will watch your  condition.  Will get help right away if you are not doing well or get worse. Document Released: 05/30/2011 Document Reviewed: 04/30/2013 [facility] Patient Information 2015 [place]. This information is not intended to replace advice given to you by your health care provider. Make sure you discuss any questions you have with your health care provider.  Obesity Obesity is having too much body fat and a body mass index (BMI) of 30 or more. BMI is a number based on your height and weight. The number is an estimate of how much body fat you have. Obesity can happen if you eat more calories than you can burn by exercising or other activity. It can cause major health problems or emergencies.  HOME CARE  Exercise and be active as told by your doctor. Try:  Using stairs when you can.  Parking farther away from store doors.  Gardening, biking, or walking.  Eat healthy foods and drinks that are low in calories. Eat more fruits and vegetables.  Limit fast food, sweets, and snack foods that are made with ingredients that are not natural (processed food).  Eat smaller amounts of food.  Keep a journal and write down what you eat every day. Websites can help with this.  Avoid drinking alcohol. Drink more water and drinks without calories.   Take vitamins and dietary pills (supplements) only as told by your doctor.  Try going to weight-loss support groups or classes to help lessen stress. Dietitians and counselors may also help. GET HELP RIGHT AWAY IF:  You have chest pain or tightness.  You have trouble breathing or feel short of breath.  You feel weak or have loss of feeling (numbness) in your legs.  You feel confused or have trouble talking.  You have sudden changes in your vision. MAKE SURE YOU:  Understand these instructions.  Will watch your condition.  Will get help right away if you are not doing well or get worse. Document Released: 05/30/2011 Document Revised:  07/22/2013 Document Reviewed: 05/30/2011 [facility] Patient Information 2015 [place], [place]. This information is not intended to replace advice given to you by your health care provider. Make sure you discuss any questions you have with your health care provider.  Bacterial Vaginosis Bacterial vaginosis is a vaginal infection that occurs when the normal balance of bacteria in the vagina is disrupted. It results from an overgrowth of certain bacteria. This is the most common vaginal infection in women of childbearing age. Treatment is important to prevent complications, especially in pregnant women, as it can cause a premature delivery. CAUSES  Bacterial vaginosis is caused by an increase in harmful bacteria that are normally present in smaller amounts in the vagina. Several different kinds of bacteria can cause bacterial vaginosis. However, the reason that the condition develops is not fully understood. RISK FACTORS Certain activities or behaviors can put you at an increased risk of developing bacterial vaginosis, including:  Having a new sex partner or multiple sex partners.  Douching.  Using an intrauterine device (IUD) for contraception. Women do not get bacterial vaginosis from toilet seats, bedding, swimming pools, or contact with objects around them. SIGNS AND SYMPTOMS  Some women with bacterial vaginosis have no signs or symptoms. Common symptoms include:  Grey vaginal discharge.  A fishlike odor with discharge, especially after sexual intercourse.  Itching or burning of the vagina and vulva.  Burning or pain with urination. DIAGNOSIS  Your health care provider will take a medical history and examine the vagina for signs of bacterial vaginosis. A sample of vaginal fluid may be taken. Your health care provider will look at this sample under a microscope to check for bacteria and abnormal cells. A vaginal pH test may also be done.  TREATMENT  Bacterial vaginosis may be treated with  antibiotic medicines. These may be given in the form of a pill or a vaginal cream. A second round of antibiotics may be prescribed if the condition comes back after treatment.  HOME CARE INSTRUCTIONS   Only take over-the-counter or prescription medicines as directed by your health care provider.  If antibiotic medicine was prescribed, take it as directed. Make sure you finish it even if you start to feel better.  Do not have sex until treatment is completed.  Tell all sexual partners that you have a vaginal infection. They should see their health care provider and be treated if they have problems, such as a mild rash or itching.  Practice safe sex by using condoms and only having one sex partner. SEEK MEDICAL CARE IF:   Your symptoms are not improving after 3 days of treatment.  You have increased discharge or pain.  You have a fever. MAKE SURE YOU:   Understand these instructions.  Will watch your condition.  Will get help right away if you are not doing well or get worse. FOR MORE INFORMATION  Centers for Disease Control and Prevention, Division of STD Prevention: [employer] American Sexual Health Association (ASHA): www.ashastd.org  Document Released: 03/07/2005 Document Revised: 12/26/2012 Document Reviewed: 10/17/2012 [facility] Patient Information 2015 [place], [place]. This information is not intended to replace advice given to you by your health care provider. Make sure you discuss any questions you have with your health care provider.

## 2014-05-28 NOTE — ED Notes (Signed)
Pt c/o sharp lower bad pain and lower back pain x 1 month, denies n/v/d. Pt c/o frequent urination and more than normal discharge.

## 2014-05-28 NOTE — ED Provider Notes (Signed)
CSN: [id number]     Arrival date & time 05/28/14  1109 History   First MD Initiated Contact with Patient 05/28/14 1407     Chief Complaint  Patient presents with  . Back Pain  . Abdominal Pain     (Consider location/radiation/quality/duration/timing/severity/associated sxs/prior Treatment) HPI Comments: Monique Bryan is a 24 y.o. female with a PMHx of morbid obesity, who presents to the ED with complaints of low back/SI joint pain 1 month and lower abdominal pain 1 month. Both are intermittent, and the abdominal pain is currently resolved, but the back pain is present. She puts the pain is 9/10 located in the lower back, SI joint region, nonradiating, achy, intermittent, worse with bending, and unrelieved with NSAIDs. Abdominal pain is described as sharp located in bilateral lower abdomen, coming intermittently, and nonradiating. Again, this is currently resolved. She endorses some increased urinary frequency. Nursing note  reports increased vaginal discharge, but patient states that she has not had any vaginal discharge, but rather that she typically gets a white discharge with her menstrual cycle, but is not having any discharge at this time. She admits recent heavy lifting, stating that she lifts her bed. Denies any fevers, chills, chest pain, shortness of breath, nausea, vomiting, diarrhea, constipation, melena, hematochezia, obstipation, dysuria, hematuria, malodorous urine, vaginal bleeding, itching, genital lesions, ongoing vaginal discharge, numbness, tingling, weakness, cauda equina symptoms, leg pain, arthralgias, or myalgias. Last menstrual period was the beginning of February, unsure of exact date. States she thinks she is coming onto her menses soon. Sexually active with 2 partners, one female and one female, unprotected. Denies any recent travel, sick contacts, suspicious food intake, or antibiotic use. Takes NSAIDs infrequently. Denies EtOH use.  Patient is a 24 y.o. female presenting  with back pain and abdominal pain. The history is provided by the patient. No language interpreter was used.  Back Pain Location:  Sacro-iliac joint and lumbar spine Quality:  Aching Radiates to:  Does not radiate Pain severity:  Moderate Pain is:  Same all the time Onset quality:  Gradual Duration:  1 month Timing:  Intermittent Progression:  Unchanged Chronicity:  New Context: lifting heavy objects   Relieved by:  Nothing Worsened by:  Bending Ineffective treatments:  NSAIDs Associated symptoms: abdominal pain (intermittent, currently resolved)   Associated symptoms: no bladder incontinence, no bowel incontinence, no chest pain, no dysuria, no fever, no headaches, no leg pain, no numbness, no paresthesias, no pelvic pain, no perianal numbness, no tingling and no weakness   Abdominal Pain Associated symptoms: no chest pain, no chills, no constipation, no diarrhea, no dysuria, no fever, no hematuria, no nausea, no shortness of breath, no vaginal bleeding, no vaginal discharge (has "white" discharge with menses, but none ongoing) and no vomiting     History reviewed. No pertinent past medical history. Past Surgical History  Procedure Laterality Date  . Tonsillectomy     No family history on file. History  Substance Use Topics  . Smoking status: Current Some Day Smoker  . Smokeless tobacco: Not on file  . Alcohol Use: No   OB History    No data available     Review of Systems  Constitutional: Negative for fever and chills.  Respiratory: Negative for shortness of breath.   Cardiovascular: Negative for chest pain.  Gastrointestinal: Positive for abdominal pain (intermittent, currently resolved). Negative for nausea, vomiting, diarrhea, constipation, blood in stool, rectal pain and bowel incontinence.  Genitourinary: Positive for frequency. Negative for bladder incontinence, dysuria, hematuria, flank  pain, vaginal bleeding, vaginal discharge (has "white" discharge with menses,  but none ongoing), genital sores, vaginal pain and pelvic pain.  Musculoskeletal: Positive for back pain (lumbar/SI joint). Negative for myalgias, joint swelling, arthralgias, gait problem and neck pain.  Skin: Negative for color change.  Allergic/Immunologic: Negative for immunocompromised state.  Neurological: Negative for tingling, weakness, numbness, headaches and paresthesias.  Psychiatric/Behavioral: Negative for confusion.   10 Systems reviewed and are negative for acute change except as noted in the HPI.    Allergies  Review of patient's allergies indicates no known allergies.  Home Medications   Prior to Admission medications   Medication Sig Start Date End Date Taking? Authorizing Provider  ibuprofen (ADVIL,MOTRIN) 800 MG tablet Take 800 mg by mouth once.   Yes Historical Provider, MD  DM-Doxylamine-Acetaminophen (VICKS NYQUIL COLD & FLU PO) Take 1 capsule by mouth 2 (two) times daily.    Historical Provider, MD  HYDROcodone-acetaminophen (HYCET) 7.5-325 mg/15 ml solution Take 15 mLs by mouth every 8 (eight) hours as needed for moderate pain. Patient not taking: Reported on 05/28/2014 07/11/13   Antony Madura, PA-C  Phenyleph-CPM-DM-Aspirin (ALKA-SELTZER PLUS COLD & COUGH PO) Take 15 mLs by mouth 2 (two) times daily.    Historical Provider, MD   BP 129/58 mmHg  Pulse 74  Temp(Src) 98.3 F (36.8 C) (Oral)  Resp 20  SpO2 100%  LMP 05/05/2014 (Exact Date) Physical Exam  Constitutional: She is oriented to person, place, and time. Vital signs are normal. She appears well-developed and well-nourished.  Non-toxic appearance. No distress.  Afebrile, nontoxic, NAD, morbidly obese female  HENT:  Head: Normocephalic and atraumatic.  Mouth/Throat: Oropharynx is clear and moist and mucous membranes are normal.  Eyes: Conjunctivae and EOM are normal. Right eye exhibits no discharge. Left eye exhibits no discharge.  Neck: Normal range of motion. Neck supple.  Cardiovascular: Normal rate,  regular rhythm, normal heart sounds and intact distal pulses.  Exam reveals no gallop and no friction rub.   No murmur heard. Pulmonary/Chest: Effort normal and breath sounds normal. No respiratory distress. She has no decreased breath sounds. She has no wheezes. She has no rhonchi. She has no rales.  Abdominal: Soft. Normal appearance and bowel sounds are normal. She exhibits no distension. There is no tenderness. There is no rigidity, no rebound, no guarding, no CVA tenderness, no tenderness at McBurney's point and negative Murphy's sign.  Soft, morbidly obese which limits exam, NTND, +BS throughout, no r/g/r, neg murphy's, neg mcburney's, no CVA TTP   Genitourinary: Vagina normal and uterus normal. There is no rash, tenderness or lesion on the right labia. There is no rash, tenderness or lesion on the left labia. Cervix exhibits no motion tenderness, no discharge and no friability. Right adnexum displays no mass, no tenderness and no fullness. Left adnexum displays no mass, no tenderness and no fullness. No erythema or bleeding in the vagina. No vaginal discharge found.  Chaperone present for exam. No rashes, lesions, or tenderness to external genitalia. No erythema, injury, or tenderness to vaginal mucosa. No vaginal discharge or bleeding within vaginal vault. No adnexal masses, tenderness, or fullness. No CMT, cervical friability, or discharge from cervical os. Uterus non-deviated, mobile, nonTTP, and without enlargement.    Musculoskeletal: Normal range of motion.       Lumbar back: Normal.  Morbidly obese female which limits exam slightly Lumbar spine with FROM intact without spinous process TTP, no bony stepoffs or deformities, no paraspinous muscle TTP or muscle spasms. Strength 5/5  in all extremities, sensation grossly intact in all extremities, negative SLR bilaterally, gait steady and nonanalgic. No overlying skin changes. Pt points to SI joint as location of pain, but unable to locate area of  tenderness to palpation.  Neurological: She is alert and oriented to person, place, and time. She has normal strength. No sensory deficit. Gait normal.  Skin: Skin is warm, dry and intact. No rash noted.  Psychiatric: She has a normal mood and affect.  Nursing note and vitals reviewed.   ED Course  Procedures (including critical care time) Labs Review Labs Reviewed  WET PREP, GENITAL - Abnormal; Notable for the following:    Clue Cells Wet Prep HPF POC MODERATE (*)    All other components within normal limits  URINALYSIS, ROUTINE W REFLEX MICROSCOPIC  POC URINE PREG, ED  GC/CHLAMYDIA PROBE AMP ([place])    Imaging Review No results found.   EKG Interpretation None      MDM   Final diagnoses:  Bilateral low back pain without sciatica  Intermittent abdominal pain  Urinary frequency  Obesity  BV (bacterial vaginosis)    24 y.o. female here with 1 month of low back pain after heavy lifting, and intermittent lower abd pain which is currently resolved. No abdominal tenderness on exam. Nursing note states pt has vaginal discharge, but pt states she has her "normal" discharge just before her menses and this is unchanged, and currently not ongoing. Denies any vaginal complaints at this time, but states she "wants a pelvic" because she "wants to make sure nothing is going on". No red flag s/s of low back pain. No s/s of central cord compression or cauda equina. Lower extremities are neurovascularly intact and patient is ambulating without difficulty. Unable to locate focal area of tenderness on palpation. Pt very obese, could be causing some strain in lumbar spine. Doubt need for imaging at this time. Will give norco now to help with pain. Will proceed with pelvic, and get upreg/urinalysis. Doubt need for labs given no ongoing abd pain at this time.  3:04 PM Pelvic exam unremarkable. Will await wet prep. Doubt need for GC/CT treatment today. U/A neg, Upreg neg. Doubt need for  further labs or imaging. Will await wet prep then reassess.  4:29 PM Multiple delays with lab, but wet prep showing clue cells. Will treat for BV. Pain improved. Patient was counseled on back pain precautions and told to do activity as tolerated but do not lift, push, or pull heavy objects more than 10 pounds for the next week. Patient counseled to use ice or heat on back for no longer than 15 minutes every hour. Rx given for narcotic pain medicine and counseled on proper use of narcotic pain medications. Told that they can increase to every 4 hrs if needed while pain is worse. Counseled not to combine this medication with others containing tylenol. Urged patient not to drink alcohol, drive, or perform any other activities that requires focus while taking either of these medications. Patient urged to follow-up with PCP if pain does not improve with treatment and rest or if pain becomes recurrent. Urged to return with worsening severe pain, loss of bowel or bladder control, trouble walking. The patient verbalizes understanding and agrees with the plan.   BP 127/66 mmHg  Pulse 64  Temp(Src) 97.8 F (36.6 C) (Oral)  Resp 16  SpO2 100%  LMP 05/05/2014 (Exact Date)  Meds ordered this encounter  Medications  . HYDROcodone-acetaminophen (NORCO/VICODIN) 5-325 MG per tablet  1 tablet    Sig:   . naproxen (NAPROSYN) 500 MG tablet    Sig: Take 1 tablet (500 mg total) by mouth 2 (two) times daily as needed for mild pain, moderate pain or headache (TAKE WITH MEALS.).    Dispense:  20 tablet    Refill:  0    Order Specific Question:  Supervising Provider    Answer:  MILLER, BRIAN [[id number]]  . HYDROcodone-acetaminophen (NORCO) 5-325 MG per tablet    Sig: Take 1 tablet by mouth every 6 (six) hours as needed for severe pain.    Dispense:  10 tablet    Refill:  0    Order Specific Question:  Supervising Provider    Answer:  Hyacinth Meeker, BRIAN [[id number]]  . metroNIDAZOLE (FLAGYL) 500 MG tablet    Sig: Take 1 tablet  (500 mg total) by mouth 2 (two) times daily. One po bid x 7 days    Dispense:  14 tablet    Refill:  0    Order Specific Question:  Supervising Provider    Answer:  Angus Seller Camprubi-Soms, PA-C 05/28/14 1630  Toy Cookey, MD 05/28/14 1951

## 2014-05-29 LAB — GC/CHLAMYDIA PROBE AMP (~~LOC~~) NOT AT ARMC
Chlamydia: NEGATIVE
Neisseria Gonorrhea: NEGATIVE

## 2014-11-03 ENCOUNTER — Encounter (HOSPITAL_COMMUNITY): Payer: Self-pay

## 2014-11-03 ENCOUNTER — Emergency Department (HOSPITAL_COMMUNITY)
Admission: EM | Admit: 2014-11-03 | Discharge: 2014-11-03 | Disposition: A | Discharge status: Home / Self Care | Payer: Self-pay | Attending: Emergency Medicine | Admitting: Emergency Medicine

## 2014-11-03 DIAGNOSIS — M545 Low back pain, unspecified: Secondary | ICD-10-CM

## 2014-11-03 DIAGNOSIS — Z720 Tobacco use: Secondary | ICD-10-CM | POA: Insufficient documentation

## 2014-11-03 LAB — URINALYSIS, ROUTINE W REFLEX MICROSCOPIC
Bilirubin Urine: NEGATIVE
Glucose, UA: NEGATIVE mg/dL
Hgb urine dipstick: NEGATIVE
Ketones, ur: NEGATIVE mg/dL
Leukocytes, UA: NEGATIVE
Nitrite: NEGATIVE
Protein, ur: NEGATIVE mg/dL
Specific Gravity, Urine: 1.031 — ABNORMAL HIGH (ref 1.005–1.030)
Urobilinogen, UA: 0.2 mg/dL (ref 0.0–1.0)
pH: 8 (ref 5.0–8.0)

## 2014-11-03 MED ORDER — KETOROLAC TROMETHAMINE 30 MG/ML IJ SOLN
30.0000 mg | Freq: Once | INTRAMUSCULAR | Status: AC
  Administered 2014-11-03: 30 mg via INTRAMUSCULAR
  Filled 2014-11-03: qty 1

## 2014-11-03 NOTE — ED Provider Notes (Signed)
CSN: [id number]     Arrival date & time 11/03/14  1751 History  This chart was scribed for Monique Forts, PA-C, working with Lavera Guise, MD by Chestine Spore, ED Scribe. The patient was seen in room WTR6/WTR6 at 7:16 PM.    Chief Complaint  Patient presents with  . Back Pain      The history is provided by the patient. No language interpreter was used.     HPI Comments: Monique Bryan is a 24 y.o. female who presents to the Emergency Department complaining of low back pain onset 1:00 PM. Pt notes that she was picking up clothes in her room when she began to have the back pain. She reports that the back pain does not radiate and she describes it as a sharp pain. She states that movement worsens her back pain. Pt notes that she works at [employer]. Pt notes that she has had back pain in the past but this current episode is more severe. She states that she has tried 2 500 mg acetaminophen with no relief for her symptoms. Pt denies gait problem, bowel/bladder incontinence, fever, chills, hematuria, dysuria, and any other symptoms. Pt does not currently have a PCP.   History reviewed. No pertinent past medical history. Past Surgical History  Procedure Laterality Date  . Tonsillectomy     History reviewed. No pertinent family history. Social History  Substance Use Topics  . Smoking status: Current Some Day Smoker  . Smokeless tobacco: None  . Alcohol Use: No   OB History    No data available     Review of Systems  All other systems reviewed and are negative.     Allergies  Review of patient's allergies indicates no known allergies.  Home Medications   Prior to Admission medications   Medication Sig Start Date End Date Taking? Authorizing Provider  naproxen (NAPROSYN) 500 MG tablet Take 1 tablet (500 mg total) by mouth 2 (two) times daily as needed for mild pain, moderate pain or headache (TAKE WITH MEALS.). 05/28/14  Yes Mercedes Camprubi-Soms, PA-C  HYDROcodone-acetaminophen  (HYCET) 7.5-325 mg/15 ml solution Take 15 mLs by mouth every 8 (eight) hours as needed for moderate pain. Patient not taking: Reported on 05/28/2014 07/11/13   Antony Madura, PA-C  HYDROcodone-acetaminophen (NORCO) 5-325 MG per tablet Take 1 tablet by mouth every 6 (six) hours as needed for severe pain. Patient not taking: Reported on 11/03/2014 05/28/14   Mercedes Camprubi-Soms, PA-C  metroNIDAZOLE (FLAGYL) 500 MG tablet Take 1 tablet (500 mg total) by mouth 2 (two) times daily. One po bid x 7 days Patient not taking: Reported on 11/03/2014 05/28/14   Mercedes Camprubi-Soms, PA-C   BP 142/80 mmHg  Pulse 82  Temp(Src) 98.3 F (36.8 C) (Oral)  Resp 18  SpO2 99% Physical Exam  Constitutional: She is oriented to person, place, and time. She appears well-developed and well-nourished. No distress.  HENT:  Head: Normocephalic and atraumatic.  Eyes: EOM are normal.  Neck: Neck supple. No tracheal deviation present.  Cardiovascular: Normal rate.   Pulmonary/Chest: Effort normal. No respiratory distress.  Musculoskeletal: Normal range of motion.  No C, T, L spine tenderness. Tenderness to the lower lumbar region. Lower extremity strength 5/5. sensation grossly intact. Cap refill less than 3 seconds. Ambulated without difficulty. Pain with forward flexion.   Neurological: She is alert and oriented to person, place, and time.  Skin: Skin is warm and dry.  Psychiatric: She has a normal mood and affect. Her behavior  is normal.  Nursing note and vitals reviewed.   ED Course  Procedures (including critical care time) Labs Review Labs Reviewed  URINALYSIS, ROUTINE W REFLEX MICROSCOPIC (NOT AT [facility]) - Abnormal; Notable for the following:    Specific Gravity, Urine 1.031 (*)    All other components within normal limits    Imaging Review No results found. I, Monique Bryan, personally reviewed and evaluated these images and lab results as part of my medical decision-making.   EKG  Interpretation None      MDM   Final diagnoses:  Bilateral low back pain without sciatica   Labs: UA-no significant findings  Imaging:  Consults:  Therapeutics: Ice, ibuprofen, and gentle massages  Discharge Meds:  Assessment/Plan: Patient presents with back pain, she has no red flags, is able ambulate without difficulty. She is given symptomatic treatment advice, an injection of Toradol, and strict return precautions, and instructions to follow up with primary care provider for further evaluation and management if symptoms persist. Patient verbalizes understanding and agreement for today's plan and had no further questions or concerns.   I personally performed the services described in this documentation, which was scribed in my presence. The recorded information has been reviewed and is accurate.   Eyvonne Mechanic, PA-C 11/05/14 1605  Lavera Guise, MD 11/06/14 [phone]

## 2014-11-03 NOTE — Discharge Instructions (Signed)
Back Exercises Back exercises help treat and prevent back injuries. The goal of back exercises is to increase the strength of your abdominal and back muscles and the flexibility of your back. These exercises should be started when you no longer have back pain. Back exercises include:  Pelvic Tilt. Lie on your back with your knees bent. Tilt your pelvis until the lower part of your back is against the floor. Hold this position 5 to 10 sec and repeat 5 to 10 times.  Knee to Chest. Pull first 1 knee up against your chest and hold for 20 to 30 seconds, repeat this with the other knee, and then both knees. This may be done with the other leg straight or bent, whichever feels better.  Sit-Ups or Curl-Ups. Bend your knees 90 degrees. Start with tilting your pelvis, and do a partial, slow sit-up, lifting your trunk only 30 to 45 degrees off the floor. Take at least 2 to 3 seconds for each sit-up. Do not do sit-ups with your knees out straight. If partial sit-ups are difficult, simply do the above but with only tightening your abdominal muscles and holding it as directed.  Hip-Lift. Lie on your back with your knees flexed 90 degrees. Push down with your feet and shoulders as you raise your hips a couple inches off the floor; hold for 10 seconds, repeat 5 to 10 times.  Back arches. Lie on your stomach, propping yourself up on bent elbows. Slowly press on your hands, causing an arch in your low back. Repeat 3 to 5 times. Any initial stiffness and discomfort should lessen with repetition over time.  Shoulder-Lifts. Lie face down with arms beside your body. Keep hips and torso pressed to floor as you slowly lift your head and shoulders off the floor. Do not overdo your exercises, especially in the beginning. Exercises may cause you some mild back discomfort which lasts for a few minutes; however, if the pain is more severe, or lasts for more than 15 minutes, do not continue exercises until you see your caregiver.  Improvement with exercise therapy for back problems is slow.  See your caregivers for assistance with developing a proper back exercise program. Document Released: 04/14/2004 Document Revised: 05/30/2011 Document Reviewed: 01/06/2011 [facility] Patient Information 2015 [place], [place]. This information is not intended to replace advice given to you by your health care provider. Make sure you discuss any questions you have with your health care provider.  Please monitor for new or worsening signs or symptoms, return immediately if any present. Please follow-up with [facility] for further evaluation and management. Please use Tylenol, ibuprofen, iceand back exercises

## 2014-11-03 NOTE — ED Notes (Signed)
Pt c/o lower back pain starting this afternoon.  Pain score 10/10.  Pt reports she was picking up clothing in her room when the pain began.  Denies GU complaints.  Sts she took ibuprofen w/o relief.

## 2014-11-17 ENCOUNTER — Inpatient Hospital Stay: Payer: Self-pay | Admitting: Family Medicine

## 2015-09-16 ENCOUNTER — Encounter (HOSPITAL_COMMUNITY): Payer: Self-pay | Admitting: Emergency Medicine

## 2015-09-16 ENCOUNTER — Emergency Department (HOSPITAL_COMMUNITY)
Admission: EM | Admit: 2015-09-16 | Discharge: 2015-09-16 | Disposition: A | Discharge status: Home / Self Care | Payer: Self-pay | Attending: Emergency Medicine | Admitting: Emergency Medicine

## 2015-09-16 DIAGNOSIS — B9689 Other specified bacterial agents as the cause of diseases classified elsewhere: Secondary | ICD-10-CM

## 2015-09-16 DIAGNOSIS — K0889 Other specified disorders of teeth and supporting structures: Secondary | ICD-10-CM | POA: Insufficient documentation

## 2015-09-16 DIAGNOSIS — N760 Acute vaginitis: Secondary | ICD-10-CM | POA: Insufficient documentation

## 2015-09-16 DIAGNOSIS — N898 Other specified noninflammatory disorders of vagina: Secondary | ICD-10-CM

## 2015-09-16 DIAGNOSIS — F17200 Nicotine dependence, unspecified, uncomplicated: Secondary | ICD-10-CM | POA: Insufficient documentation

## 2015-09-16 LAB — URINALYSIS, ROUTINE W REFLEX MICROSCOPIC
Bilirubin Urine: NEGATIVE
Glucose, UA: NEGATIVE mg/dL
Hgb urine dipstick: NEGATIVE
Ketones, ur: NEGATIVE mg/dL
Leukocytes, UA: NEGATIVE
Nitrite: NEGATIVE
Protein, ur: NEGATIVE mg/dL
Specific Gravity, Urine: 1.024 (ref 1.005–1.030)
pH: 6.5 (ref 5.0–8.0)

## 2015-09-16 LAB — RAPID HIV SCREEN (HIV 1/2 AB+AG)
HIV 1/2 Antibodies: NONREACTIVE
HIV-1 P24 Antigen - HIV24: NONREACTIVE

## 2015-09-16 LAB — WET PREP, GENITAL
Sperm: NONE SEEN
Trich, Wet Prep: NONE SEEN
WBC, Wet Prep HPF POC: NONE SEEN
Yeast Wet Prep HPF POC: NONE SEEN

## 2015-09-16 MED ORDER — METRONIDAZOLE 500 MG PO TABS: 500.0000 mg | ORAL_TABLET | Freq: Two times a day (BID) | ORAL | Status: DC

## 2015-09-16 MED ORDER — STERILE WATER FOR INJECTION IJ SOLN
INTRAMUSCULAR | Status: AC
  Administered 2015-09-16: 0.9 mL
  Filled 2015-09-16: qty 10

## 2015-09-16 MED ORDER — CEFTRIAXONE SODIUM 250 MG IJ SOLR
250.0000 mg | Freq: Once | INTRAMUSCULAR | Status: AC
  Administered 2015-09-16: 250 mg via INTRAMUSCULAR
  Filled 2015-09-16: qty 250

## 2015-09-16 MED ORDER — AZITHROMYCIN 250 MG PO TABS
1000.0000 mg | ORAL_TABLET | Freq: Once | ORAL | Status: AC
  Administered 2015-09-16: 1000 mg via ORAL
  Filled 2015-09-16: qty 4

## 2015-09-16 MED ORDER — PENICILLIN V POTASSIUM 250 MG PO TABS: 250.0000 mg | ORAL_TABLET | Freq: Four times a day (QID) | ORAL | Status: AC

## 2015-09-16 NOTE — ED Provider Notes (Signed)
CSN: [id number]     Arrival date & time 09/16/15  1336 History   First MD Initiated Contact with Patient 09/16/15 1418     Chief Complaint  Patient presents with  . Vaginal Discharge     (Consider location/radiation/quality/duration/timing/severity/associated sxs/prior Treatment) HPI Comments: Pt comes in with cc of vaginal discharge. Discharge x 2 days ago. No abd pain. Discharge is foul smelling. Having unprotected intercourse with 1 partner, but concerned that he might not be reliable. PT has no hx of STD.    Patient is a 25 y.o. female presenting with vaginal discharge. The history is provided by the patient.  Vaginal Discharge Associated symptoms: no abdominal pain, no dysuria, no nausea and no vomiting     History reviewed. No pertinent past medical history. Past Surgical History  Procedure Laterality Date  . Tonsillectomy     No family history on file. Social History  Substance Use Topics  . Smoking status: Current Some Day Smoker  . Smokeless tobacco: None  . Alcohol Use: No   OB History    No data available     Review of Systems  Constitutional: Negative for activity change.  Respiratory: Negative for shortness of breath.   Cardiovascular: Negative for chest pain.  Gastrointestinal: Negative for nausea, vomiting and abdominal pain.  Genitourinary: Positive for vaginal discharge. Negative for dysuria and pelvic pain.  Musculoskeletal: Negative for neck pain.  Neurological: Negative for headaches.      Allergies  Review of patient's allergies indicates no known allergies.  Home Medications   Prior to Admission medications   Medication Sig Start Date End Date Taking? Authorizing Provider  ibuprofen (ADVIL,MOTRIN) 200 MG tablet Take 200-400 mg by mouth every 6 (six) hours as needed for headache or moderate pain.   Yes Historical Provider, MD  penicillin v potassium (VEETID) 250 MG tablet Take 1 tablet (250 mg total) by mouth 4 (four) times daily. 09/16/15  09/23/15  Jaedyn Lard, MD   BP 134/83 mmHg  Pulse 67  Temp(Src) 98.8 F (37.1 C) (Oral)  Resp 18  SpO2 100% Physical Exam  Constitutional: She is oriented to person, place, and time. She appears well-developed.  HENT:  Head: Normocephalic and atraumatic.  Eyes: Conjunctivae and EOM are normal. Pupils are equal, round, and reactive to light.  Neck: Normal range of motion. Neck supple.  Cardiovascular: Normal rate, regular rhythm, normal heart sounds and intact distal pulses.   No murmur heard. Pulmonary/Chest: Effort normal. No respiratory distress. She has no wheezes.  Abdominal: Soft. Bowel sounds are normal. She exhibits no distension. There is no tenderness. There is no rebound and no guarding.  Genitourinary: Vagina normal and uterus normal.  External exam - normal, no lesions Speculum exam: Pt has some white discharge, no blood   Neurological: She is alert and oriented to person, place, and time.  Skin: Skin is warm and dry.  Nursing note and vitals reviewed.   ED Course  Procedures (including critical care time) Labs Review Labs Reviewed  WET PREP, GENITAL - Abnormal; Notable for the following:    Clue Cells Wet Prep HPF POC PRESENT (*)    All other components within normal limits  URINALYSIS, ROUTINE W REFLEX MICROSCOPIC (NOT [facility])  RAPID HIV SCREEN (HIV 1/2 AB+AG)  GC/CHLAMYDIA PROBE AMP ([place]) NOT [facility]    Imaging Review No results found. I have personally reviewed and evaluated these images and lab results as part of my medical decision-making.   EKG Interpretation None  MDM   Final diagnoses:  Vaginal discharge    Vaginal discharge w/o abd pain/pelvic pain. + BV. GC and Chlmaydia covered. BV meds given in case the discharge doesn't clear. Also c/o dental pain when we discharged her, she will get pen v k, for intermittent intermittent dental pain on the L side - wait and watch approach as well. She is to see oral surgeon.  Derwood KaplanAnkit  Miaa Latterell, MD 09/16/15 [phone]

## 2015-09-16 NOTE — Discharge Instructions (Signed)

## 2015-09-16 NOTE — ED Notes (Signed)
Pt c/o smooth, thick, white, odorless vaginal discharge 2 days ago. Pt noted bad vaginal odor on partner's penis immediately after vaginal intercourse. Pt self-treated for a self-diagnosed vaginal yeast infection with 7-day OTC antifungal 2 weeks ago but stopped taking medication early due to beginning menstrual cycle which normally resolves yeast infection. No itching, irritation.

## 2015-09-17 LAB — GC/CHLAMYDIA PROBE AMP (~~LOC~~) NOT AT ARMC
Chlamydia: NEGATIVE
Neisseria Gonorrhea: NEGATIVE

## 2016-05-29 ENCOUNTER — Emergency Department (HOSPITAL_COMMUNITY): Payer: BLUE CROSS/BLUE SHIELD

## 2016-05-29 ENCOUNTER — Encounter (HOSPITAL_COMMUNITY): Payer: Self-pay

## 2016-05-29 ENCOUNTER — Emergency Department (HOSPITAL_COMMUNITY)
Admission: EM | Admit: 2016-05-29 | Discharge: 2016-05-29 | Disposition: A | Discharge status: Home / Self Care | Payer: BLUE CROSS/BLUE SHIELD | Attending: Emergency Medicine | Admitting: Emergency Medicine

## 2016-05-29 DIAGNOSIS — J219 Acute bronchiolitis, unspecified: Secondary | ICD-10-CM | POA: Diagnosis not present

## 2016-05-29 DIAGNOSIS — J9801 Acute bronchospasm: Secondary | ICD-10-CM | POA: Diagnosis not present

## 2016-05-29 DIAGNOSIS — F17200 Nicotine dependence, unspecified, uncomplicated: Secondary | ICD-10-CM | POA: Insufficient documentation

## 2016-05-29 DIAGNOSIS — R05 Cough: Secondary | ICD-10-CM | POA: Diagnosis not present

## 2016-05-29 DIAGNOSIS — Z79899 Other long term (current) drug therapy: Secondary | ICD-10-CM | POA: Insufficient documentation

## 2016-05-29 DIAGNOSIS — J209 Acute bronchitis, unspecified: Secondary | ICD-10-CM | POA: Diagnosis not present

## 2016-05-29 DIAGNOSIS — R0602 Shortness of breath: Secondary | ICD-10-CM | POA: Diagnosis not present

## 2016-05-29 MED ORDER — ALBUTEROL SULFATE HFA 108 (90 BASE) MCG/ACT IN AERS
2.0000 | INHALATION_SPRAY | Freq: Once | RESPIRATORY_TRACT | Status: AC
  Administered 2016-05-29: 2 via RESPIRATORY_TRACT
  Filled 2016-05-29: qty 6.7

## 2016-05-29 MED ORDER — ALBUTEROL SULFATE (2.5 MG/3ML) 0.083% IN NEBU
5.0000 mg | INHALATION_SOLUTION | Freq: Once | RESPIRATORY_TRACT | Status: AC
  Administered 2016-05-29: 5 mg via RESPIRATORY_TRACT
  Filled 2016-05-29: qty 6

## 2016-05-29 MED ORDER — PREDNISONE 10 MG (21) PO TBPK: ORAL_TABLET | Freq: Every day | ORAL | 0 refills | Status: DC

## 2016-05-29 MED ORDER — PREDNISONE 20 MG PO TABS
60.0000 mg | ORAL_TABLET | Freq: Once | ORAL | Status: AC
  Administered 2016-05-29: 60 mg via ORAL
  Filled 2016-05-29: qty 3

## 2016-05-29 MED ORDER — HYDROCODONE-HOMATROPINE 5-1.5 MG/5ML PO SYRP: 5.0000 mL | ORAL_SOLUTION | Freq: Four times a day (QID) | ORAL | 0 refills | Status: DC | PRN

## 2016-05-29 MED ORDER — IPRATROPIUM BROMIDE 0.02 % IN SOLN
0.5000 mg | Freq: Once | RESPIRATORY_TRACT | Status: AC
  Administered 2016-05-29: 0.5 mg via RESPIRATORY_TRACT
  Filled 2016-05-29: qty 2.5

## 2016-05-29 NOTE — Discharge Instructions (Signed)
Read the information below.  Use the prescribed medication as directed.  Please discuss all new medications with your pharmacist.  You may return to the Emergency Department at any time for worsening condition or any new symptoms that concern you.   If you develop worsening shortness of breath, uncontrolled wheezing, severe chest pain, or fevers despite using tylenol and/or ibuprofen, return for a recheck.     °

## 2016-05-29 NOTE — ED Provider Notes (Signed)
[facility]-EMERGENCY DEPT Provider Note   CSN: [id number] Arrival date & time: 05/29/16  1106     History   Chief Complaint Chief Complaint  Patient presents with  . congestion, cough    HPI Monique Bryan is a 26 y.o. female.  HPI  Pt with 4 days of nasal congestion, rhinorrhea, sore throat, cough productive of yellow sputum.  Last night she became SOB.  She has been taking nyquil and ibuprofen without improvement.  She is a smoker.  Denies hx asthma.  Denies leg swelling, recent immobilization, exogenous estrogen.   History reviewed. No pertinent past medical history.  There are no active problems to display for this patient.   Past Surgical History:  Procedure Laterality Date  . TONSILLECTOMY      OB History    No data available       Home Medications    Prior to Admission medications   Medication Sig Start Date End Date Taking? Authorizing Provider  HYDROcodone-homatropine (HYCODAN) 5-1.5 MG/5ML syrup Take 5 mLs by mouth every 6 (six) hours as needed for cough. 05/29/16   Trixie DredgeEmily Karissa Meenan, PA-C  ibuprofen (ADVIL,MOTRIN) 200 MG tablet Take 200-400 mg by mouth every 6 (six) hours as needed for headache or moderate pain.    Historical Provider, MD  metroNIDAZOLE (FLAGYL) 500 MG tablet Take 1 tablet (500 mg total) by mouth 2 (two) times daily. 09/16/15   Derwood KaplanAnkit Nanavati, MD  predniSONE (STERAPRED UNI-PAK 21 TAB) 10 MG (21) TBPK tablet Take by mouth daily. Day 1: take 6 tabs.  Day 2: 5 tabs  Day 3: 4 tabs  Day 4: 3 tabs  Day 5: 2 tabs  Day 6: 1 tab Start 05/30/16 05/29/16   Trixie DredgeEmily Sakira Dahmer, PA-C    Family History No family history on file.  Social History Social History  Substance Use Topics  . Smoking status: Current Some Day Smoker  . Smokeless tobacco: Not on file  . Alcohol use No     Allergies   Patient has no known allergies.   Review of Systems Review of Systems  All other systems reviewed and are negative.    Physical Exam Updated Vital Signs BP 118/86  (BP Location: Right Arm)   Pulse 107   Temp 99.1 F (37.3 C) (Oral)   Resp 17   LMP 05/15/2016 Comment: patient shielded  SpO2 97%   Physical Exam  Constitutional: She appears well-developed and well-nourished. No distress.  HENT:  Head: Normocephalic and atraumatic.  Mouth/Throat: Oropharynx is clear and moist. No oropharyngeal exudate.  Eyes: Conjunctivae are normal.  Neck: Normal range of motion. Neck supple.  Cardiovascular: Normal rate and regular rhythm.   Pulmonary/Chest: Effort normal. Tachypnea noted. No respiratory distress. She has wheezes. She has no rales.  Abdominal: Soft. She exhibits no distension. There is no tenderness. There is no rebound and no guarding.  Neurological: She is alert.  Skin: She is not diaphoretic.  Nursing note and vitals reviewed.    ED Treatments / Results  Labs (all labs ordered are listed, but only abnormal results are displayed) Labs Reviewed - No data to display  EKG  EKG Interpretation None       Radiology Dg Chest 2 View  Result Date: 05/29/2016 CLINICAL DATA:  Cough and shortness of breath for 2 days EXAM: CHEST  2 VIEW COMPARISON:  03/24/2013 FINDINGS: The heart size and mediastinal contours are within normal limits. Both lungs are clear. The visualized skeletal structures are unremarkable. IMPRESSION: No active cardiopulmonary disease.  Electronically Signed   By: Alcide Clever M.D.   On: 05/29/2016 11:56    Procedures Procedures (including critical care time)  Medications Ordered in ED Medications  albuterol (PROVENTIL) (2.5 MG/3ML) 0.083% nebulizer solution 5 mg (5 mg Nebulization Given 05/29/16 1304)  ipratropium (ATROVENT) nebulizer solution 0.5 mg (0.5 mg Nebulization Given 05/29/16 1304)  predniSONE (DELTASONE) tablet 60 mg (60 mg Oral Given 05/29/16 1304)  albuterol (PROVENTIL) (2.5 MG/3ML) 0.083% nebulizer solution 5 mg (5 mg Nebulization Given 05/29/16 1403)  ipratropium (ATROVENT) nebulizer solution 0.5 mg (0.5 mg  Nebulization Given 05/29/16 1403)  albuterol (PROVENTIL HFA;VENTOLIN HFA) 108 (90 Base) MCG/ACT inhaler 2 puff (2 puffs Inhalation Given 05/29/16 1518)     Initial Impression / Assessment and Plan / ED Course  I have reviewed the triage vital signs and the nursing notes.  Pertinent labs & imaging results that were available during my care of the patient were reviewed by me and considered in my medical decision making (see chart for details).  Clinical Course as of May 30 1614  Sun May 29, 2016  1329 Pt feeling improvement with nebs.  Continued wheezing on exam but with improved air movement.    [EW]  1433 Pt feeling much better.  Lungs CTAB.  [EW]    Clinical Course User Index [EW] Trixie Dredge, PA-C   Afebrile, nontoxic patient with constellation of symptoms suggestive of viral syndrome/influenza-like illness with bronchospasm.  CXR negative.  She does not have asthma.  Improved with nebs and steroids.  Discharged home with supportive care, PCP follow up.  Discussed result, findings, treatment, and follow up  with patient.  Pt given return precautions.  Pt verbalizes understanding and agrees with plan.      Final Clinical Impressions(s) / ED Diagnoses   Final diagnoses:  Bronchospasm with bronchitis, acute    New Prescriptions Discharge Medication List as of 05/29/2016  3:12 PM    START taking these medications   Details  HYDROcodone-homatropine (HYCODAN) 5-1.5 MG/5ML syrup Take 5 mLs by mouth every 6 (six) hours as needed for cough., Starting Sun 05/29/2016, Print    predniSONE (STERAPRED UNI-PAK 21 TAB) 10 MG (21) TBPK tablet Take by mouth daily. Day 1: take 6 tabs.  Day 2: 5 tabs  Day 3: 4 tabs  Day 4: 3 tabs  Day 5: 2 tabs  Day 6: 1 tab Start 05/30/16, Starting Sun 05/29/2016, Print         [place], PA-C 05/29/16 1618    Lorre Nick, MD 05/29/16 1642

## 2016-05-29 NOTE — ED Triage Notes (Signed)
Patient complains of 2 days of cough, congestion, scratchy throat. States that she has been taking otc meds with no relief, coughing on arrival, upper airway congestion

## 2016-09-02 ENCOUNTER — Encounter (HOSPITAL_COMMUNITY): Payer: Self-pay | Admitting: Emergency Medicine

## 2016-09-02 ENCOUNTER — Emergency Department (HOSPITAL_COMMUNITY)
Admission: EM | Admit: 2016-09-02 | Discharge: 2016-09-02 | Disposition: A | Discharge status: Home / Self Care | Payer: BLUE CROSS/BLUE SHIELD | Attending: Emergency Medicine | Admitting: Emergency Medicine

## 2016-09-02 ENCOUNTER — Emergency Department (HOSPITAL_COMMUNITY): Payer: BLUE CROSS/BLUE SHIELD

## 2016-09-02 DIAGNOSIS — Z79899 Other long term (current) drug therapy: Secondary | ICD-10-CM | POA: Diagnosis not present

## 2016-09-02 DIAGNOSIS — F17200 Nicotine dependence, unspecified, uncomplicated: Secondary | ICD-10-CM | POA: Insufficient documentation

## 2016-09-02 DIAGNOSIS — M545 Low back pain, unspecified: Secondary | ICD-10-CM

## 2016-09-02 DIAGNOSIS — R109 Unspecified abdominal pain: Secondary | ICD-10-CM | POA: Diagnosis not present

## 2016-09-02 DIAGNOSIS — R1032 Left lower quadrant pain: Secondary | ICD-10-CM | POA: Insufficient documentation

## 2016-09-02 DIAGNOSIS — M549 Dorsalgia, unspecified: Secondary | ICD-10-CM | POA: Diagnosis present

## 2016-09-02 LAB — BASIC METABOLIC PANEL
Anion gap: 7 (ref 5–15)
BUN: 9 mg/dL (ref 6–20)
CO2: 26 mmol/L (ref 22–32)
Calcium: 8.9 mg/dL (ref 8.9–10.3)
Chloride: 104 mmol/L (ref 101–111)
Creatinine, Ser: 0.64 mg/dL (ref 0.44–1.00)
GFR calc Af Amer: >60 mL/min (ref >60)
GFR calc non Af Amer: >60 mL/min (ref >60)
Glucose, Bld: 98 mg/dL (ref 65–99)
Potassium: 4.1 mmol/L (ref 3.5–5.1)
Sodium: 137 mmol/L (ref 135–145)

## 2016-09-02 LAB — CBC
HCT: 39.6 % (ref 36.0–46.0)
Hemoglobin: 13.3 g/dL (ref 12.0–15.0)
MCH: 29.5 pg (ref 26.0–34.0)
MCHC: 33.6 g/dL (ref 30.0–36.0)
MCV: 87.8 fL (ref 78.0–100.0)
Platelets: 312 10*3/uL (ref 150–400)
RBC: 4.51 MIL/uL (ref 3.87–5.11)
RDW: 13.5 % (ref 11.5–15.5)
WBC: 9.6 10*3/uL (ref 4.0–10.5)

## 2016-09-02 LAB — URINALYSIS, ROUTINE W REFLEX MICROSCOPIC
Bilirubin Urine: NEGATIVE
Glucose, UA: NEGATIVE mg/dL
Hgb urine dipstick: NEGATIVE
Ketones, ur: NEGATIVE mg/dL
Leukocytes, UA: NEGATIVE
Nitrite: NEGATIVE
Protein, ur: NEGATIVE mg/dL
Specific Gravity, Urine: 1.018 (ref 1.005–1.030)
pH: 6 (ref 5.0–8.0)

## 2016-09-02 LAB — I-STAT BETA HCG BLOOD, ED (MC, WL, AP ONLY): I-stat hCG, quantitative: <5 m[IU]/mL (ref <5)

## 2016-09-02 MED ORDER — HYDROMORPHONE HCL 1 MG/ML IJ SOLN
1.0000 mg | Freq: Once | INTRAMUSCULAR | Status: AC
Start: 2016-09-02 — End: 2016-09-02
  Administered 2016-09-02: 1 mg via INTRAVENOUS
  Filled 2016-09-02: qty 1

## 2016-09-02 MED ORDER — CYCLOBENZAPRINE HCL 10 MG PO TABS: 10.0000 mg | ORAL_TABLET | Freq: Two times a day (BID) | ORAL | 0 refills | Status: DC | PRN

## 2016-09-02 MED ORDER — NAPROXEN 500 MG PO TABS: 500.0000 mg | ORAL_TABLET | Freq: Two times a day (BID) | ORAL | 0 refills | Status: DC

## 2016-09-02 MED ORDER — KETOROLAC TROMETHAMINE 30 MG/ML IJ SOLN
15.0000 mg | Freq: Once | INTRAMUSCULAR | Status: AC
  Administered 2016-09-02: 15 mg via INTRAVENOUS
  Filled 2016-09-02: qty 1

## 2016-09-02 NOTE — ED Provider Notes (Signed)
[facility]-EMERGENCY DEPT Provider Note   CSN: [id number] Arrival date & time: 09/02/16  1023     History   Chief Complaint Chief Complaint  Patient presents with  . Back Pain    HPI Monique Bryan is a 26 y.o. female.  HPI Pt started having back pain yesterday.   She tried tylenol yesterday.  She also taking muscle relaxers. This morning the pain was more severe.  When she tried to use the restroom the pain was really bad when sitting.  She was able to urinate but she started to feel lightheaded.  No dysuria.  The pain radiates to the lower abdomen on the right side.  No vomiting or diarrhea.  No vag bleeding or discharge.  History reviewed. No pertinent past medical history.  There are no active problems to display for this patient.   Past Surgical History:  Procedure Laterality Date  . TONSILLECTOMY      OB History    No data available       Home Medications    Prior to Admission medications   Medication Sig Start Date End Date Taking? Authorizing Provider  cyclobenzaprine (FLEXERIL) 10 MG tablet Take 1 tablet (10 mg total) by mouth 2 (two) times daily as needed for muscle spasms. 09/02/16   Linwood Dibbles, MD  HYDROcodone-homatropine [facility]) 5-1.5 MG/5ML syrup Take 5 mLs by mouth every 6 (six) hours as needed for cough. Patient not taking: Reported on 09/02/2016 05/29/16   Trixie Dredge, PA-C  metroNIDAZOLE (FLAGYL) 500 MG tablet Take 1 tablet (500 mg total) by mouth 2 (two) times daily. Patient not taking: Reported on 09/02/2016 09/16/15   Derwood Kaplan, MD  naproxen (NAPROSYN) 500 MG tablet Take 1 tablet (500 mg total) by mouth 2 (two) times daily with a meal. As needed for pain 09/02/16   Linwood Dibbles, MD  predniSONE (STERAPRED UNI-PAK 21 TAB) 10 MG (21) TBPK tablet Take by mouth daily. Day 1: take 6 tabs.  Day 2: 5 tabs  Day 3: 4 tabs  Day 4: 3 tabs  Day 5: 2 tabs  Day 6: 1 tab Start 05/30/16 Patient not taking: Reported on 09/02/2016 05/29/16   Trixie Dredge, PA-C    Family  History No family history on file.  Social History Social History  Substance Use Topics  . Smoking status: Current Some Day Smoker  . Smokeless tobacco: Not on file  . Alcohol use No     Allergies   Patient has no known allergies.   Review of Systems Review of Systems  All other systems reviewed and are negative.    Physical Exam Updated Vital Signs BP 116/73 (BP Location: Left Arm)   Pulse 63   Temp 98.4 F (36.9 C) (Oral)   Resp 18   SpO2 100%   Physical Exam  Constitutional: She appears distressed.  HENT:  Head: Atraumatic.  Right Ear: External ear normal.  Left Ear: External ear normal.  Eyes: Conjunctivae are normal. Right eye exhibits no discharge. Left eye exhibits no discharge. No scleral icterus.  Neck: Neck supple. No tracheal deviation present.  Cardiovascular: Normal rate, regular rhythm and intact distal pulses.   Pulmonary/Chest: Effort normal and breath sounds normal. No stridor. No respiratory distress. She has no wheezes. She has no rales.  Abdominal: Soft. Bowel sounds are normal. She exhibits no distension. There is no tenderness. There is no rebound and no guarding.  Musculoskeletal: She exhibits no edema.       Lumbar back: She exhibits tenderness  and bony tenderness. She exhibits no swelling and no deformity.  Neurological: She is alert. She has normal strength. No cranial nerve deficit (no facial droop, extraocular movements intact, no slurred speech) or sensory deficit. She exhibits normal muscle tone. She displays no seizure activity. Coordination normal.  Skin: Skin is warm and dry. No rash noted. She is not diaphoretic.  Psychiatric: She has a normal mood and affect.  Nursing note and vitals reviewed.    ED Treatments / Results  Labs (all labs ordered are listed, but only abnormal results are displayed) Labs Reviewed  URINALYSIS, ROUTINE W REFLEX MICROSCOPIC - Abnormal; Notable for the following:       Result Value   APPearance HAZY  (*)    All other components within normal limits  CBC  BASIC METABOLIC PANEL  I-STAT BETA HCG BLOOD, ED ([facility], [facility], [facility] ONLY)    Radiology Ct Abdomen Pelvis Wo Contrast  Result Date: 09/02/2016 CLINICAL DATA:  Acute right flank pain. EXAM: CT ABDOMEN AND PELVIS WITHOUT CONTRAST TECHNIQUE: Multidetector CT imaging of the abdomen and pelvis was performed following the standard protocol without IV contrast. COMPARISON:  None. FINDINGS: Lower chest: No acute abnormality. Hepatobiliary: No focal liver abnormality is seen. No gallstones, gallbladder wall thickening, or biliary dilatation. Pancreas: Unremarkable. No pancreatic ductal dilatation or surrounding inflammatory changes. Spleen: Normal in size without focal abnormality. Adrenals/Urinary Tract: Adrenal glands are unremarkable. Kidneys are normal, without renal calculi, focal lesion, or hydronephrosis. Bladder is unremarkable. Stomach/Bowel: Stomach is within normal limits. Appendix appears normal. No evidence of bowel wall thickening, distention, or inflammatory changes. Vascular/Lymphatic: No significant vascular findings are present. No enlarged abdominal or pelvic lymph nodes. Reproductive: Uterus and bilateral adnexa are unremarkable. Other: No abdominal wall hernia or abnormality. No abdominopelvic ascites. Musculoskeletal: No acute or significant osseous findings. IMPRESSION: No abnormality seen in the abdomen or pelvis. Electronically Signed   By: Lupita RaiderJames  Green Jr, M.D.   On: 09/02/2016 12:02    Procedures Procedures (including critical care time)  Medications Ordered in ED Medications  ketorolac (TORADOL) 30 MG/ML injection 15 mg (not administered)  HYDROmorphone (DILAUDID) injection 1 mg (1 mg Intravenous Given 09/02/16 1101)     Initial Impression / Assessment and Plan / ED Course  I have reviewed the triage vital signs and the nursing notes.  Pertinent labs & imaging results that were available during my care of the patient were  reviewed by me and considered in my medical decision making (see chart for details).    No sign of acute neurological or vascular emergency associated with pt's back pain.  No kidney stone.  Labs are normal. Doubt sciatica.  Safe for outpatient follow up.   Final Clinical Impressions(s) / ED Diagnoses   Final diagnoses:  Acute low back pain without sciatica, unspecified back pain laterality    New Prescriptions New Prescriptions   CYCLOBENZAPRINE (FLEXERIL) 10 MG TABLET    Take 1 tablet (10 mg total) by mouth 2 (two) times daily as needed for muscle spasms.   NAPROXEN (NAPROSYN) 500 MG TABLET    Take 1 tablet (500 mg total) by mouth 2 (two) times daily with a meal. As needed for pain     Linwood DibblesKnapp, Jerrianne Hartin, MD 09/02/16 1438

## 2016-09-02 NOTE — ED Notes (Signed)
Patient aware that we need urine, Unable to provide at this time

## 2016-09-02 NOTE — Discharge Instructions (Signed)
Take the medications as needed for pain, follow up with your primary care doctor, consider seeing a primary care doctor or chiropractor for further treatment

## 2016-09-02 NOTE — ED Triage Notes (Signed)
Pt c/o low medial back pain onset yesterday, LLQ abdominal/groin pain. Took 2 extra strength Tylenol and muscle relaxer, was able to sleep. Woke up in pain, light headed. Severe pain with ambulance. No bowel or bladder incontinence. Did exercises yesterday but did not notice any sudden onset of pain, does not recall any injury or twisting.

## 2016-10-12 DIAGNOSIS — Z1159 Encounter for screening for other viral diseases: Secondary | ICD-10-CM | POA: Diagnosis not present

## 2016-10-12 DIAGNOSIS — Z114 Encounter for screening for human immunodeficiency virus [HIV]: Secondary | ICD-10-CM | POA: Diagnosis not present

## 2016-10-12 DIAGNOSIS — Z130 Encounter for screening for diseases of the blood and blood-forming organs and certain disorders involving the immune mechanism: Secondary | ICD-10-CM | POA: Diagnosis not present

## 2016-10-12 DIAGNOSIS — Z131 Encounter for screening for diabetes mellitus: Secondary | ICD-10-CM | POA: Diagnosis not present

## 2016-10-12 DIAGNOSIS — Z1329 Encounter for screening for other suspected endocrine disorder: Secondary | ICD-10-CM | POA: Diagnosis not present

## 2016-10-12 DIAGNOSIS — Z01419 Encounter for gynecological examination (general) (routine) without abnormal findings: Secondary | ICD-10-CM | POA: Diagnosis not present

## 2016-10-12 DIAGNOSIS — Z113 Encounter for screening for infections with a predominantly sexual mode of transmission: Secondary | ICD-10-CM | POA: Diagnosis not present

## 2016-10-12 DIAGNOSIS — Z6841 Body Mass Index (BMI) 40.0 and over, adult: Secondary | ICD-10-CM | POA: Diagnosis not present

## 2016-12-13 DIAGNOSIS — N898 Other specified noninflammatory disorders of vagina: Secondary | ICD-10-CM | POA: Diagnosis not present

## 2016-12-13 DIAGNOSIS — N912 Amenorrhea, unspecified: Secondary | ICD-10-CM | POA: Diagnosis not present

## 2017-07-28 ENCOUNTER — Encounter (HOSPITAL_COMMUNITY): Payer: Self-pay | Admitting: *Deleted

## 2017-07-28 ENCOUNTER — Other Ambulatory Visit: Payer: Self-pay

## 2017-07-28 ENCOUNTER — Inpatient Hospital Stay (HOSPITAL_COMMUNITY): Payer: BLUE CROSS/BLUE SHIELD

## 2017-07-28 ENCOUNTER — Inpatient Hospital Stay (HOSPITAL_COMMUNITY)
Admission: AD | Admit: 2017-07-28 | Discharge: 2017-07-28 | Disposition: A | Discharge status: Home / Self Care | Payer: BLUE CROSS/BLUE SHIELD | Source: Ambulatory Visit | Attending: Obstetrics and Gynecology | Admitting: Obstetrics and Gynecology

## 2017-07-28 DIAGNOSIS — O26891 Other specified pregnancy related conditions, first trimester: Secondary | ICD-10-CM | POA: Insufficient documentation

## 2017-07-28 DIAGNOSIS — O26899 Other specified pregnancy related conditions, unspecified trimester: Secondary | ICD-10-CM | POA: Diagnosis not present

## 2017-07-28 DIAGNOSIS — R109 Unspecified abdominal pain: Secondary | ICD-10-CM | POA: Insufficient documentation

## 2017-07-28 DIAGNOSIS — Z3A01 Less than 8 weeks gestation of pregnancy: Secondary | ICD-10-CM

## 2017-07-28 HISTORY — DX: Other specified health status: Z78.9

## 2017-07-28 LAB — CBC
HCT: 42.2 % (ref 36.0–46.0)
Hemoglobin: 13.5 g/dL (ref 12.0–15.0)
MCH: 29 pg (ref 26.0–34.0)
MCHC: 32 g/dL (ref 30.0–36.0)
MCV: 90.6 fL (ref 78.0–100.0)
Platelets: 337 10*3/uL (ref 150–400)
RBC: 4.66 MIL/uL (ref 3.87–5.11)
RDW: 14.1 % (ref 11.5–15.5)
WBC: 12.7 10*3/uL — ABNORMAL HIGH (ref 4.0–10.5)

## 2017-07-28 LAB — COMPREHENSIVE METABOLIC PANEL
ALT: 22 U/L (ref 14–54)
AST: 18 U/L (ref 15–41)
Albumin: 3.4 g/dL — ABNORMAL LOW (ref 3.5–5.0)
Alkaline Phosphatase: 67 U/L (ref 38–126)
Anion gap: 9 (ref 5–15)
BUN: 7 mg/dL (ref 6–20)
CO2: 24 mmol/L (ref 22–32)
Calcium: 9.1 mg/dL (ref 8.9–10.3)
Chloride: 106 mmol/L (ref 101–111)
Creatinine, Ser: 0.64 mg/dL (ref 0.44–1.00)
GFR calc Af Amer: >60 mL/min (ref >60)
GFR calc non Af Amer: >60 mL/min (ref >60)
Glucose, Bld: 139 mg/dL — ABNORMAL HIGH (ref 65–99)
Potassium: 3.9 mmol/L (ref 3.5–5.1)
Sodium: 139 mmol/L (ref 135–145)
Total Bilirubin: 0.5 mg/dL (ref 0.3–1.2)
Total Protein: 7 g/dL (ref 6.5–8.1)

## 2017-07-28 LAB — ABO/RH: ABO/RH(D): O POS

## 2017-07-28 LAB — POCT PREGNANCY, URINE: Preg Test, Ur: POSITIVE — AB

## 2017-07-28 LAB — URINALYSIS, ROUTINE W REFLEX MICROSCOPIC
Bacteria, UA: NONE SEEN
Bilirubin Urine: NEGATIVE
Glucose, UA: NEGATIVE mg/dL
Hgb urine dipstick: NEGATIVE
Ketones, ur: NEGATIVE mg/dL
Leukocytes, UA: NEGATIVE
Nitrite: NEGATIVE
Protein, ur: 30 mg/dL — AB
Specific Gravity, Urine: 1.027 (ref 1.005–1.030)
pH: 6 (ref 5.0–8.0)

## 2017-07-28 LAB — WET PREP, GENITAL
Clue Cells Wet Prep HPF POC: NONE SEEN
Sperm: NONE SEEN
Trich, Wet Prep: NONE SEEN
WBC, Wet Prep HPF POC: NONE SEEN
Yeast Wet Prep HPF POC: NONE SEEN

## 2017-07-28 LAB — HCG, QUANTITATIVE, PREGNANCY: hCG, Beta Chain, Quant, S: 1802 m[IU]/mL — ABNORMAL HIGH (ref <5)

## 2017-07-28 NOTE — MAU Provider Note (Signed)
Patient Monique Bryan is a 27 y.o. G1P0 At [redacted]w[redacted]d here with complaints of pain. She denies vaginal bleeding, unusual discharge, pain with urination, constant nausea and vomiting, or other ob-gyn complaints.  History     CSN: [id number]  Arrival date and time: 07/28/17 1232   None     Chief Complaint  Patient presents with  . Abdominal Pain  . Possible Pregnancy   Abdominal Pain  This is a new problem. The current episode started yesterday. The onset quality is sudden. The pain is located in the suprapubic region. The pain is at a severity of 5/10. The abdominal pain does not radiate. Nothing aggravates the pain. The pain is relieved by nothing.  Possible Pregnancy  Associated symptoms include abdominal pain.   The patient states that she is very anxious about the pregnancy; she is unsure what she wants to do.  OB History    Gravida  1   Para      Term      Preterm      AB      Living        SAB      TAB      Ectopic      Multiple      Live Births              Past Medical History:  Diagnosis Date  . Medical history non-contributory     Past Surgical History:  Procedure Laterality Date  . TONSILLECTOMY      History reviewed. No pertinent family history.  Social History   Tobacco Use  . Smoking status: Never Smoker  . Smokeless tobacco: Never Used  Substance Use Topics  . Alcohol use: No  . Drug use: Not Currently    Types: Marijuana    Allergies: No Known Allergies  No medications prior to admission.    Review of Systems  Constitutional: Negative.   HENT: Negative.   Respiratory: Negative.   Cardiovascular: Negative.   Gastrointestinal: Positive for abdominal pain.  Musculoskeletal: Negative.   Neurological: Negative.   Hematological: Negative.   Psychiatric/Behavioral: Negative.    Physical Exam   Blood pressure (!) 159/80, pulse (!) 107, temperature 98.6 F (37 C), temperature source Oral, resp. rate 20, height   (1.778 m), weight (!) 351 lb (159.2 kg), last menstrual period 06/27/2017, SpO2 100 %.  Physical Exam  Constitutional: She is oriented to person, place, and time. She appears well-developed.  HENT:  Head: Normocephalic.  Eyes: Pupils are equal, round, and reactive to light.  Neck: Normal range of motion.  GI: Soft.  Musculoskeletal: Normal range of motion.  Neurological: She is oriented to person, place, and time.  Skin: Skin is warm.  Psychiatric: She has a normal mood and affect.    MAU Course  Procedures  MDM -wet prep normal -Gc ct pending -UA normal -beta is 1802 -BP elevated in MAU; patient is not known to have CHTN.  - US Ob Comp Less 14 Wks  Result Date: 07/28/2017 CLINICAL DATA:  Abdominal pain. Positive pregnancy test. Gestational age by LMP of 4 weeks 3 days. EXAM: OBSTETRIC <14 WK [place] AND TRANSVAGINAL OB US TECHNIQUE: Both transabdominal and transvaginal ultrasound examinations were performed for complete evaluation of the gestation as well as the maternal uterus, adnexal regions, and pelvic cul-de-sac. Transvaginal technique was performed to assess early pregnancy. COMPARISON:  None. FINDINGS: Intrauterine gestational sac: Single, with double decidual sac sign noted. Yolk sac:  Not Visualized. Embryo:  Not Visualized. MSD: 4 mm   5 w   1 d Subchorionic hemorrhage:  None visualized. Maternal uterus/adnexae: Normal appearance of both ovaries. No mass or abnormal free fluid identified. IMPRESSION: Single early approximately 5 week intrauterine gestational sac. Consider following serial b-hCG levels, with followup ultrasound to assess viability in 14 days. No significant maternal uterine or adnexal abnormality identified. Electronically Signed   By: Myles Rosenthal M.D.   On: 07/28/2017 15:47   US Ob Transvaginal  Result Date: 07/28/2017 CLINICAL DATA:  Abdominal pain. Positive pregnancy test. Gestational age by LMP of 4 weeks 3 days. EXAM: OBSTETRIC <14 WK [place] AND TRANSVAGINAL OB US  TECHNIQUE: Both transabdominal and transvaginal ultrasound examinations were performed for complete evaluation of the gestation as well as the maternal uterus, adnexal regions, and pelvic cul-de-sac. Transvaginal technique was performed to assess early pregnancy. COMPARISON:  None. FINDINGS: Intrauterine gestational sac: Single, with double decidual sac sign noted. Yolk sac:  Not Visualized. Embryo:  Not Visualized. MSD: 4 mm   5 w   1 d Subchorionic hemorrhage:  None visualized. Maternal uterus/adnexae: Normal appearance of both ovaries. No mass or abnormal free fluid identified. IMPRESSION: Single early approximately 5 week intrauterine gestational sac. Consider following serial b-hCG levels, with followup ultrasound to assess viability in 14 days. No significant maternal uterine or adnexal abnormality identified. Electronically Signed   By: Myles Rosenthal M.D.   On: 07/28/2017 15:47    Assessment and Plan   1. Less than [redacted] weeks gestation of pregnancy   2. Abdominal pain affecting pregnancy    2. Patient stable for discharge; per Dr. Amado Nash patient to call [facility] on Monday morning and schedule a follow-up beta. She should also schedule an [place] for two weeks from now. 3. Strict ectopic precautions given, although reassured by presence of gestational sac.   4. Discharge BP lower; consider diagnosis of chronic hypertension.   Charlesetta Garibaldi Analeigh Aries 07/28/2017, 4:02 PM

## 2017-07-28 NOTE — MAU Note (Signed)
Pt reports she had a positive preg test and is having lower abd cramping.

## 2017-07-28 NOTE — Discharge Instructions (Signed)
-  call [facility] and tell them you are "early pregnant" and that you were seen in the MAU on Friday, MAy 10th. Dr. Amado Nash wants you to have a repeat pregnancy hormone level check.  -Tell them that you need an [place] In two weeks, per Dr. Amado Nash.   Ectopic Pregnancy An ectopic pregnancy happens when a fertilized egg grows outside the uterus. A pregnancy cannot live outside of the uterus. This problem often happens in the fallopian tube. It is often caused by damage to the fallopian tube. If this problem is found early, you may be treated with medicine. If your tube tears or bursts open (ruptures), you will bleed inside. This is an emergency. You will need surgery. Get help right away. What are the signs or symptoms? You may have normal pregnancy symptoms at first. These include:  Missing your period.  Feeling sick to your stomach (nauseous).  Being tired.  Having tender breasts.  Then, you may start to have symptoms that are not normal. These include:  Pain with sex (intercourse).  Bleeding from the vagina. This includes light bleeding (spotting).  Belly (abdomen) or lower belly cramping or pain. This may be felt on one side.  A fast heartbeat (pulse).  Passing out (fainting) after going poop (bowel movement).  If your tube tears, you may have symptoms such as:  Really bad pain in the belly or lower belly. This happens suddenly.  Dizziness.  Passing out.  Shoulder pain.  Get help right away if: You have any of these symptoms. This is an emergency. This information is not intended to replace advice given to you by your health care provider. Make sure you discuss any questions you have with your health care provider. Document Released: 06/03/2008 Document Revised: 08/13/2015 Document Reviewed: 10/17/2012 Elsevier Interactive Patient Education  2017 [employer].

## 2017-07-28 NOTE — MAU Note (Signed)
Took home pregnancy test today and it was positive.  Came here for confirmation.  Tearful, states she is very nervous and is uncertain which of two men may be the father of baby.

## 2017-07-31 DIAGNOSIS — Z3A01 Less than 8 weeks gestation of pregnancy: Secondary | ICD-10-CM | POA: Diagnosis not present

## 2017-07-31 DIAGNOSIS — O26891 Other specified pregnancy related conditions, first trimester: Secondary | ICD-10-CM | POA: Diagnosis not present

## 2017-07-31 LAB — GC/CHLAMYDIA PROBE AMP (~~LOC~~) NOT AT ARMC
Chlamydia: NEGATIVE
Neisseria Gonorrhea: NEGATIVE

## 2017-08-02 DIAGNOSIS — Z3A01 Less than 8 weeks gestation of pregnancy: Secondary | ICD-10-CM | POA: Diagnosis not present

## 2017-08-02 DIAGNOSIS — O26899 Other specified pregnancy related conditions, unspecified trimester: Secondary | ICD-10-CM | POA: Diagnosis not present

## 2017-08-09 DIAGNOSIS — Z3201 Encounter for pregnancy test, result positive: Secondary | ICD-10-CM | POA: Diagnosis not present

## 2017-09-23 DIAGNOSIS — N898 Other specified noninflammatory disorders of vagina: Secondary | ICD-10-CM | POA: Diagnosis not present

## 2017-09-27 DIAGNOSIS — O99213 Obesity complicating pregnancy, third trimester: Secondary | ICD-10-CM | POA: Diagnosis not present

## 2017-09-27 DIAGNOSIS — Z113 Encounter for screening for infections with a predominantly sexual mode of transmission: Secondary | ICD-10-CM | POA: Diagnosis not present

## 2017-09-27 DIAGNOSIS — Z369 Encounter for antenatal screening, unspecified: Secondary | ICD-10-CM | POA: Diagnosis not present

## 2017-09-27 DIAGNOSIS — Z3481 Encounter for supervision of other normal pregnancy, first trimester: Secondary | ICD-10-CM | POA: Diagnosis not present

## 2017-09-27 DIAGNOSIS — Z3A13 13 weeks gestation of pregnancy: Secondary | ICD-10-CM | POA: Diagnosis not present

## 2017-09-27 LAB — OB RESULTS CONSOLE RUBELLA ANTIBODY, IGM: Rubella: IMMUNE

## 2017-09-27 LAB — OB RESULTS CONSOLE GC/CHLAMYDIA
Chlamydia: NEGATIVE
Gonorrhea: NEGATIVE

## 2017-09-27 LAB — OB RESULTS CONSOLE RPR: RPR: NONREACTIVE

## 2017-09-27 LAB — OB RESULTS CONSOLE ABO/RH: RH Type: POSITIVE

## 2017-09-27 LAB — OB RESULTS CONSOLE HIV ANTIBODY (ROUTINE TESTING): HIV: NONREACTIVE

## 2017-09-27 LAB — OB RESULTS CONSOLE ANTIBODY SCREEN: Antibody Screen: NEGATIVE

## 2017-09-27 LAB — OB RESULTS CONSOLE HEPATITIS B SURFACE ANTIGEN: Hepatitis B Surface Ag: NEGATIVE

## 2017-10-27 DIAGNOSIS — O99212 Obesity complicating pregnancy, second trimester: Secondary | ICD-10-CM | POA: Diagnosis not present

## 2017-10-27 DIAGNOSIS — Z3A17 17 weeks gestation of pregnancy: Secondary | ICD-10-CM | POA: Diagnosis not present

## 2017-11-10 DIAGNOSIS — Z363 Encounter for antenatal screening for malformations: Secondary | ICD-10-CM | POA: Diagnosis not present

## 2017-11-10 DIAGNOSIS — Z3A19 19 weeks gestation of pregnancy: Secondary | ICD-10-CM | POA: Diagnosis not present

## 2017-11-13 DIAGNOSIS — Z369 Encounter for antenatal screening, unspecified: Secondary | ICD-10-CM | POA: Diagnosis not present

## 2017-11-13 DIAGNOSIS — Z3A20 20 weeks gestation of pregnancy: Secondary | ICD-10-CM | POA: Diagnosis not present

## 2017-11-13 DIAGNOSIS — O99212 Obesity complicating pregnancy, second trimester: Secondary | ICD-10-CM | POA: Diagnosis not present

## 2017-12-11 DIAGNOSIS — Z362 Encounter for other antenatal screening follow-up: Secondary | ICD-10-CM | POA: Diagnosis not present

## 2017-12-11 DIAGNOSIS — Z3A24 24 weeks gestation of pregnancy: Secondary | ICD-10-CM | POA: Diagnosis not present

## 2017-12-29 DIAGNOSIS — O26849 Uterine size-date discrepancy, unspecified trimester: Secondary | ICD-10-CM | POA: Diagnosis not present

## 2017-12-29 DIAGNOSIS — Z3A36 36 weeks gestation of pregnancy: Secondary | ICD-10-CM | POA: Diagnosis not present

## 2017-12-29 DIAGNOSIS — Z23 Encounter for immunization: Secondary | ICD-10-CM | POA: Diagnosis not present

## 2017-12-29 DIAGNOSIS — Z369 Encounter for antenatal screening, unspecified: Secondary | ICD-10-CM | POA: Diagnosis not present

## 2018-01-26 DIAGNOSIS — Z3A30 30 weeks gestation of pregnancy: Secondary | ICD-10-CM | POA: Diagnosis not present

## 2018-01-26 DIAGNOSIS — O99213 Obesity complicating pregnancy, third trimester: Secondary | ICD-10-CM | POA: Diagnosis not present

## 2018-02-08 DIAGNOSIS — O99213 Obesity complicating pregnancy, third trimester: Secondary | ICD-10-CM | POA: Diagnosis not present

## 2018-02-08 DIAGNOSIS — Z3A32 32 weeks gestation of pregnancy: Secondary | ICD-10-CM | POA: Diagnosis not present

## 2018-02-12 DIAGNOSIS — Z3A33 33 weeks gestation of pregnancy: Secondary | ICD-10-CM | POA: Diagnosis not present

## 2018-02-12 DIAGNOSIS — O99213 Obesity complicating pregnancy, third trimester: Secondary | ICD-10-CM | POA: Diagnosis not present

## 2018-02-13 DIAGNOSIS — O99213 Obesity complicating pregnancy, third trimester: Secondary | ICD-10-CM | POA: Diagnosis not present

## 2018-02-13 DIAGNOSIS — Z3A33 33 weeks gestation of pregnancy: Secondary | ICD-10-CM | POA: Diagnosis not present

## 2018-02-14 ENCOUNTER — Other Ambulatory Visit (HOSPITAL_COMMUNITY): Payer: Self-pay | Admitting: Obstetrics and Gynecology

## 2018-02-14 DIAGNOSIS — Z3A34 34 weeks gestation of pregnancy: Secondary | ICD-10-CM

## 2018-02-14 DIAGNOSIS — O99213 Obesity complicating pregnancy, third trimester: Secondary | ICD-10-CM

## 2018-02-22 ENCOUNTER — Ambulatory Visit (HOSPITAL_COMMUNITY)
Admission: RE | Admit: 2018-02-22 | Discharge: 2018-02-22 | Disposition: A | Discharge status: Home / Self Care | Payer: BLUE CROSS/BLUE SHIELD | Source: Ambulatory Visit | Attending: Obstetrics and Gynecology | Admitting: Obstetrics and Gynecology

## 2018-02-22 ENCOUNTER — Other Ambulatory Visit: Payer: Self-pay

## 2018-02-22 DIAGNOSIS — Z3A34 34 weeks gestation of pregnancy: Secondary | ICD-10-CM

## 2018-02-22 DIAGNOSIS — O99213 Obesity complicating pregnancy, third trimester: Secondary | ICD-10-CM | POA: Insufficient documentation

## 2018-02-22 DIAGNOSIS — L918 Other hypertrophic disorders of the skin: Secondary | ICD-10-CM | POA: Diagnosis not present

## 2018-02-26 DIAGNOSIS — O99213 Obesity complicating pregnancy, third trimester: Secondary | ICD-10-CM | POA: Diagnosis not present

## 2018-02-26 DIAGNOSIS — Z3A35 35 weeks gestation of pregnancy: Secondary | ICD-10-CM | POA: Diagnosis not present

## 2018-02-27 DIAGNOSIS — Z3492 Encounter for supervision of normal pregnancy, unspecified, second trimester: Secondary | ICD-10-CM | POA: Diagnosis not present

## 2018-02-27 LAB — OB RESULTS CONSOLE GBS: GBS: NEGATIVE

## 2018-03-15 ENCOUNTER — Inpatient Hospital Stay (HOSPITAL_COMMUNITY)
Admission: AD | Admit: 2018-03-15 | Discharge: 2018-03-15 | Disposition: A | Discharge status: Home / Self Care | Payer: BLUE CROSS/BLUE SHIELD | Attending: Obstetrics and Gynecology | Admitting: Obstetrics and Gynecology

## 2018-03-15 ENCOUNTER — Encounter (HOSPITAL_COMMUNITY): Payer: Self-pay | Admitting: *Deleted

## 2018-03-15 ENCOUNTER — Other Ambulatory Visit: Payer: Self-pay

## 2018-03-15 DIAGNOSIS — O139 Gestational [pregnancy-induced] hypertension without significant proteinuria, unspecified trimester: Secondary | ICD-10-CM | POA: Diagnosis not present

## 2018-03-15 DIAGNOSIS — Z3A37 37 weeks gestation of pregnancy: Secondary | ICD-10-CM | POA: Diagnosis not present

## 2018-03-15 DIAGNOSIS — R030 Elevated blood-pressure reading, without diagnosis of hypertension: Secondary | ICD-10-CM | POA: Diagnosis present

## 2018-03-15 DIAGNOSIS — O133 Gestational [pregnancy-induced] hypertension without significant proteinuria, third trimester: Secondary | ICD-10-CM | POA: Insufficient documentation

## 2018-03-15 DIAGNOSIS — Z6841 Body Mass Index (BMI) 40.0 and over, adult: Secondary | ICD-10-CM | POA: Diagnosis not present

## 2018-03-15 LAB — CBC
HCT: 40 % (ref 36.0–46.0)
Hemoglobin: 13.4 g/dL (ref 12.0–15.0)
MCH: 29.7 pg (ref 26.0–34.0)
MCHC: 33.5 g/dL (ref 30.0–36.0)
MCV: 88.7 fL (ref 80.0–100.0)
Platelets: 222 10*3/uL (ref 150–400)
RBC: 4.51 MIL/uL (ref 3.87–5.11)
RDW: 14.7 % (ref 11.5–15.5)
WBC: 11.2 10*3/uL — ABNORMAL HIGH (ref 4.0–10.5)
nRBC: 0 % (ref 0.0–0.2)

## 2018-03-15 LAB — PROTEIN / CREATININE RATIO, URINE
Creatinine, Urine: 82 mg/dL
Protein Creatinine Ratio: 0.16 mg/mg{Cre} — ABNORMAL HIGH (ref 0.00–0.15)
Total Protein, Urine: 13 mg/dL

## 2018-03-15 LAB — COMPREHENSIVE METABOLIC PANEL
ALT: 15 U/L (ref 0–44)
AST: 16 U/L (ref 15–41)
Albumin: 2.8 g/dL — ABNORMAL LOW (ref 3.5–5.0)
Alkaline Phosphatase: 107 U/L (ref 38–126)
Anion gap: 8 (ref 5–15)
BUN: 8 mg/dL (ref 6–20)
CO2: 19 mmol/L — ABNORMAL LOW (ref 22–32)
Calcium: 8.9 mg/dL (ref 8.9–10.3)
Chloride: 106 mmol/L (ref 98–111)
Creatinine, Ser: 0.48 mg/dL (ref 0.44–1.00)
GFR calc Af Amer: >60 mL/min (ref >60)
GFR calc non Af Amer: >60 mL/min (ref >60)
Glucose, Bld: 144 mg/dL — ABNORMAL HIGH (ref 70–99)
Potassium: 4.2 mmol/L (ref 3.5–5.1)
Sodium: 133 mmol/L — ABNORMAL LOW (ref 135–145)
Total Bilirubin: 0.8 mg/dL (ref 0.3–1.2)
Total Protein: 7.1 g/dL (ref 6.5–8.1)

## 2018-03-15 NOTE — MAU Provider Note (Signed)
Chief Complaint:  Hypertension   First Provider Initiated Contact with Patient 03/15/18 1228     CCOB  HPI: Monique HolterMadelyn Bryan is a 27 y.o. G1P0 at 27w2dwho presents to maternity admissions reporting elevated BPs in office.  Denies other symptoms..Had a H/A last week but none since. She reports good fetal movement, denies LOF, vaginal bleeding, vaginal itching/burning, urinary symptoms, h/a, dizziness, n/v, diarrhea, constipation or fever/chills.  She denies headache, visual changes or RUQ abdominal pain.  Hypertension  The current episode started today. Associated symptoms include peripheral edema. Pertinent negatives include no anxiety, blurred vision, chest pain or headaches. There are no associated agents to hypertension. Past treatments include nothing. There are no compliance problems.     RN Note: Pt sent from office for increase in blood pressure. Denies any HA or blurred vision. Denies any VB or LOF. + FM  Past Medical History: Past Medical History:  Diagnosis Date  . Medical history non-contributory     Past obstetric history: OB History  Gravida Para Term Preterm AB Living  1            SAB TAB Ectopic Multiple Live Births               # Outcome Date GA Lbr Len/2nd Weight Sex Delivery Anes PTL Lv  1 Current             Past Surgical History: Past Surgical History:  Procedure Laterality Date  . TONSILLECTOMY      Family History: History reviewed. No pertinent family history.  Social History: Social History   Tobacco Use  . Smoking status: Never Smoker  . Smokeless tobacco: Never Used  Substance Use Topics  . Alcohol use: No  . Drug use: Not Currently    Types: Marijuana    Allergies: No Known Allergies  Meds:  No medications prior to admission.    I have reviewed patient's Past Medical Hx, Surgical Hx, Family Hx, Social Hx, medications and allergies.   ROS:  Review of Systems  Eyes: Negative for blurred vision.  Cardiovascular: Negative for  chest pain.  Neurological: Negative for headaches.   Other systems negative  Physical Exam   Patient Vitals for the past 24 hrs:  BP Temp Pulse Resp Height Weight  03/15/18 1216 (!) 145/78 - 79 - - -  03/15/18 1157 (!) 147/101 98.1 F (36.7 C) 87 16 5\' 10"  (1.778 m) (!) 159.7 kg   Vitals:   03/15/18 1231 03/15/18 1246 03/15/18 1300 03/15/18 1316  BP: (!) 142/75 125/67 133/60 (!) 145/79  Pulse: 77 79 74 75  Resp:      Temp:      Weight:      Height:        Constitutional: Well-developed, well-nourished female in no acute distress.  Cardiovascular: normal rate and rhythm Respiratory: normal effort, clear to auscultation bilaterally GI: Abd soft, non-tender, gravid appropriate for gestational age.   No rebound or guarding. MS: Extremities nontender, Trace-1+ LE edema, normal ROM Neurologic: Alert and oriented x 4.  DTRs 2+ with no clonus GU: Neg CVAT.  PELVIC EXAM: Deferred  FHT:  Baseline 145 , moderate variability, accelerations present, no decelerations Contractions:  Irregular     Labs: --/--/O POS Performed at [facility], [address] [place], [place] [address]  [phone](05/10 1419) Results for orders placed or performed during the hospital encounter of 03/15/18 (from the past 24 hour(s))  Protein / creatinine ratio, urine     Status: Abnormal  Collection Time: 03/15/18 11:53 AM  Result Value Ref Range   Creatinine, Urine 82.00 mg/dL   Total Protein, Urine 13 mg/dL   Protein Creatinine Ratio 0.16 (H) 0.00 - 0.15 mg/mg[Cre]  CBC     Status: Abnormal   Collection Time: 03/15/18 12:10 PM  Result Value Ref Range   WBC 11.2 (H) 4.0 - 10.5 K/uL   RBC 4.51 3.87 - 5.11 MIL/uL   Hemoglobin 13.4 12.0 - 15.0 g/dL   HCT 16.140.0 09.636.0 - 04.546.0 %   MCV 88.7 80.0 - 100.0 fL   MCH 29.7 26.0 - 34.0 pg   MCHC 33.5 30.0 - 36.0 g/dL   RDW 40.914.7 81.111.5 - 91.415.5 %   Platelets 222 150 - 400 K/uL   nRBC 0.0 0.0 - 0.2 %  Comprehensive metabolic panel     Status: Abnormal   Collection  Time: 03/15/18 12:10 PM  Result Value Ref Range   Sodium 133 (L) 135 - 145 mmol/L   Potassium 4.2 3.5 - 5.1 mmol/L   Chloride 106 98 - 111 mmol/L   CO2 19 (L) 22 - 32 mmol/L   Glucose, Bld 144 (H) 70 - 99 mg/dL   BUN 8 6 - 20 mg/dL   Creatinine, Ser 7.820.48 0.44 - 1.00 mg/dL   Calcium 8.9 8.9 - 95.610.3 mg/dL   Total Protein 7.1 6.5 - 8.1 g/dL   Albumin 2.8 (L) 3.5 - 5.0 g/dL   AST 16 15 - 41 U/L   ALT 15 0 - 44 U/L   Alkaline Phosphatase 107 38 - 126 U/L   Total Bilirubin 0.8 0.3 - 1.2 mg/dL   GFR calc non Af Amer >60 >60 mL/min   GFR calc Af Amer >60 >60 mL/min   Anion gap 8 5 - 15    Imaging:    MAU Course/MDM: I have ordered labs and reviewed results. Normal preeclampsia labs.  BPs have improved since her initial one.  NST reviewed and is reactive.  Treatments in MAU included EFM.    Assessment: Single Intrauterine Pregnancy at [redacted]w[redacted]d Gestational Hypertension with no signs of preeclampsia  Plan: Discharge home Preeclampsia signs reviewed Labor precautions and fetal kick counts Follow up in Office for prenatal visits and recheck of BP  Encouraged to return here or to other Urgent Care/ED if she develops worsening of symptoms, increase in pain, fever, or other concerning symptoms.   Pt stable at time of discharge.  Wynelle BourgeoisMarie Williams CNM, MSN Certified Nurse-Midwife 03/15/2018 12:28 PM

## 2018-03-15 NOTE — Discharge Instructions (Signed)
Hypertension During Pregnancy ° °Hypertension, commonly called high blood pressure, is when the force of blood pumping through your arteries is too strong. Arteries are blood vessels that carry blood from the heart throughout the body. Hypertension during pregnancy can cause problems for you and your baby. Your baby may be born early (prematurely) or may not weigh as much as he or she should at birth. Very bad cases of hypertension during pregnancy can be life-threatening. °Different types of hypertension can occur during pregnancy. These include: °· Chronic hypertension. This happens when: °? You have hypertension before pregnancy and it continues during pregnancy. °? You develop hypertension before you are [redacted] weeks pregnant, and it continues during pregnancy. °· Gestational hypertension. This is hypertension that develops after the 20th week of pregnancy. °· Preeclampsia, also called toxemia of pregnancy. This is a very serious type of hypertension that develops during pregnancy. It can be very dangerous for you and your baby. °? In rare cases, you may develop preeclampsia after giving birth (postpartum preeclampsia). This usually occurs within 48 hours after childbirth but may occur up to 6 weeks after giving birth. °Gestational hypertension and preeclampsia usually go away within 6 weeks after your baby is born. Women who have hypertension during pregnancy have a greater chance of developing hypertension later in life or during future pregnancies. °What are the causes? °The exact cause of hypertension during pregnancy is not known. °What increases the risk? °There are certain factors that make it more likely for you to develop hypertension during pregnancy. These include: °· Having hypertension during a previous pregnancy or prior to pregnancy. °· Being overweight. °· Being age 35 or older. °· Being pregnant for the first time. °· Being pregnant with more than one baby. °· Becoming pregnant using fertilization  methods such as IVF (in vitro fertilization). °· Having diabetes, kidney problems, or systemic lupus erythematosus. °· Having a family history of hypertension. °What are the signs or symptoms? °Chronic hypertension and gestational hypertension rarely cause symptoms. Preeclampsia causes symptoms, which may include: °· Increased protein in your urine. Your health care provider will check for this at every visit before you give birth (prenatal visit). °· Severe headaches. °· Sudden weight gain. °· Swelling of the hands, face, legs, and feet. °· Nausea and vomiting. °· Vision problems, such as blurred or double vision. °· Numbness in the face, arms, legs, and feet. °· Dizziness. °· Slurred speech. °· Sensitivity to bright lights. °· Abdominal pain. °· Convulsions or seizures. °How is this diagnosed? °You may be diagnosed with hypertension during a routine prenatal exam. At each prenatal visit, you may: °· Have a urine test to check for high amounts of protein in your urine. °· Have your blood pressure checked. A blood pressure reading is given as two numbers, such as "120 over 80" (or 120/80). The first ("top") number is a measure of the pressure in your arteries when your heart beats (systolic pressure). The second ("bottom") number is a measure of the pressure in your arteries as your heart relaxes between beats (diastolic pressure). Blood pressure is measured in a unit called mm Hg. For most women, a normal blood pressure reading is: °? Systolic: below 120. °? Diastolic: below 80. °The type of hypertension that you are diagnosed with depends on your test results and when your symptoms developed. °· Chronic hypertension is usually diagnosed before 20 weeks of pregnancy. °· Gestational hypertension is usually diagnosed after 20 weeks of pregnancy. °· Hypertension with high amounts of protein in   the urine is diagnosed as preeclampsia. °· Blood pressure measurements that stay above 160 systolic, or above 110 diastolic,  are signs of severe preeclampsia. °How is this treated? °Treatment for hypertension during pregnancy varies depending on the type of hypertension you have and how serious it is. °· If you take medicines called ACE inhibitors to treat chronic hypertension, you may need to switch medicines. ACE inhibitors should not be taken during pregnancy. °· If you have gestational hypertension, you may need to take blood pressure medicine. °· If you are at risk for preeclampsia, your health care provider may recommend that you take a low-dose aspirin during your pregnancy. °· If you have severe preeclampsia, you may need to be hospitalized so you and your baby can be monitored closely. You may also need to take medicine (magnesium sulfate) to prevent seizures and to lower blood pressure. This medicine may be given as an injection or through an IV. °· In some cases, if your condition gets worse, you may need to deliver your baby early. °Follow these instructions at home: °Eating and drinking ° °· Drink enough fluid to keep your urine pale yellow. °· Avoid caffeine. °Lifestyle °· Do not use any products that contain nicotine or tobacco, such as cigarettes and e-cigarettes. If you need help quitting, ask your health care provider. °· Do not use alcohol or drugs. °· Avoid stress as much as possible. Rest and get plenty of sleep. °General instructions °· Take over-the-counter and prescription medicines only as told by your health care provider. °· While lying down, lie on your left side. This keeps pressure off your major blood vessels. °· While sitting or lying down, raise (elevate) your feet. Try putting some pillows under your lower legs. °· Exercise regularly. Ask your health care provider what kinds of exercise are best for you. °· Keep all prenatal and follow-up visits as told by your health care provider. This is important. °Contact a health care provider if: °· You have symptoms that your health care provider told you may  require more treatment or monitoring, such as: °? Nausea or vomiting. °? Headache. °Get help right away if you have: °· Severe abdominal pain that does not get better with treatment. °· A severe headache that does not get better. °· Vomiting that does not get better. °· Sudden, rapid weight gain. °· Sudden swelling in your hands, ankles, or face. °· Vaginal bleeding. °· Blood in your urine. °· Fewer movements from your baby than usual. °· Blurred or double vision. °· Muscle twitching or sudden muscle tightening (spasms). °· Shortness of breath. °· Blue fingernails or lips. °Summary °· Hypertension, commonly called high blood pressure, is when the force of blood pumping through your arteries is too strong. °· Hypertension during pregnancy can cause problems for you and your baby. °· Treatment for hypertension during pregnancy varies depending on the type of hypertension you have and how serious it is. °· Get help right away if you have symptoms that your health care provider told you to watch for. °This information is not intended to replace advice given to you by your health care provider. Make sure you discuss any questions you have with your health care provider. °Document Released: 11/23/2010 Document Revised: 02/21/2017 Document Reviewed: 08/21/2015 °Elsevier Interactive Patient Education © 2019 [employer] ° °

## 2018-03-15 NOTE — MAU Note (Signed)
Pt sent from office for increase in blood pressure. Denies any HA or blurred vision. Denies any VB or LOF. + FM

## 2018-03-19 DIAGNOSIS — O139 Gestational [pregnancy-induced] hypertension without significant proteinuria, unspecified trimester: Secondary | ICD-10-CM | POA: Diagnosis not present

## 2018-03-20 ENCOUNTER — Encounter (HOSPITAL_COMMUNITY): Payer: Self-pay | Admitting: *Deleted

## 2018-03-20 ENCOUNTER — Telehealth (HOSPITAL_COMMUNITY): Payer: Self-pay | Admitting: *Deleted

## 2018-03-20 ENCOUNTER — Other Ambulatory Visit: Payer: Self-pay | Admitting: Obstetrics & Gynecology

## 2018-03-20 NOTE — Telephone Encounter (Signed)
Preadmission screen  

## 2018-03-21 NOTE — L&D Delivery Note (Addendum)
Delivery Note   03/26/2018  Date of delivery: 03/27/18  Monique Bryan, 28 y.o., @ [redacted]w[redacted]d,  G1P0, who was admitted for IOL for obesity and GHT with normotensive BP and negative preeclampsia labs. Pt had been comfortable post epidural placement.  I was in pt room rounding, when Cat 2 strip was noted, pt progressed rapidly to 5/100/0, unable to trace fetus nor maternal cxt very well, Risk and benefit reviewed or AROM, FSE, and IUPC, pt verbally consented and denies any questions. All three were performed @ 2336 was AROM, clear fluid and scant amount. @ 2339 FSE placed. and mother tolerated well, fetus tolerated procedure well, but @ 2343 deceled noted to nadir of 80, returned with bolus and material position change.  I walked out of the room with cat 2 strip, but baseline stable in 150s, 5 mins later RN called me back to the room @ 2356 when the pt was feeling the overwhelming urge to push and had been pushing she progressed to complete and +2 station in the second stage of labor.  She pushed for 9/min. The decel continued despite giving o2, bolus and position change, Dr Monique Bryan called and notified, permission given to get faculty, NICU was called and in route to the room, Monique Bannister DO  Was now present in room and verbalized risk and benefits for a vacuum assist, pt verbalized understanding and verbally consented to the procedure. Monique Bryan, used a Industrial/product designer for vacuome assist device. No pop-offs were noted, She delivered a viable infant, cephalic and restituted to the LOT position over an intact perineum.  A nuchal cord   was identified x3 tight nuchal cords. The baby was placed on radiant warmer with NICU while initial step of NRP were perfmored (Dry, Stimulated, and warmed). Hat placed on baby for thermoregulation. Delayed cord clamping was not performed due to NICU in room to assess.  Cord double clamped and cut.  Cord cut by Monique Bannister DO. Apgar scores were 8 and 9. Prophylactic Pitocin was started  in the third stage of labor for active management. Gush of blood was noted, with continued bleed, pt BP was 124/63, pt was stable, but bleeding continue with boggy uterus, Cytotec, txa, and methergine IM were given to pt, pt tolerated well, with fundal massage, uterus became firm, bleeding stopped. The placenta delivered spontaneously, shultz, with a 3 vessel cord and was sent to LD.  Inspection revealed 2nd degree. An examination of the vaginal vault and cervix was free from lacerations. The uterus was firm, bleeding stable.  The repair was done under local.   Placenta and umbilical artery blood gas were sent.  There were no complications during the procedure.  Mom and baby skin to skin following delivery. Left in stable condition.  Maternal Info: Anesthesia:Epidural Episiotomy: No Lacerations:  2nd degree Suture Repair: 3.0 vicryl Est. Blood Loss (mL):  1000 per my guesstimate and 850 per [place].   Newborn Info:  Baby Sex: female Circumcision: N/A Babies Name: Monique Bryan APGAR (1 MIN): 8   APGAR (5 MINS): 9   APGAR (10 MINS):     Mom to postpartum.  Baby to Couplet care / Skin to Skin.   [place], [place], NP-C 03/27/18 1:31 AM

## 2018-03-22 DIAGNOSIS — O139 Gestational [pregnancy-induced] hypertension without significant proteinuria, unspecified trimester: Secondary | ICD-10-CM | POA: Diagnosis not present

## 2018-03-26 ENCOUNTER — Inpatient Hospital Stay (HOSPITAL_COMMUNITY): Payer: BLUE CROSS/BLUE SHIELD | Admitting: Anesthesiology

## 2018-03-26 ENCOUNTER — Other Ambulatory Visit: Payer: Self-pay

## 2018-03-26 ENCOUNTER — Inpatient Hospital Stay (HOSPITAL_COMMUNITY)
Admission: RE | Admit: 2018-03-26 | Discharge: 2018-03-29 | DRG: 807 | Disposition: A | Discharge status: Home / Self Care | Payer: BLUE CROSS/BLUE SHIELD | Attending: Obstetrics & Gynecology | Admitting: Obstetrics & Gynecology

## 2018-03-26 ENCOUNTER — Encounter (HOSPITAL_COMMUNITY): Payer: Self-pay

## 2018-03-26 DIAGNOSIS — O139 Gestational [pregnancy-induced] hypertension without significant proteinuria, unspecified trimester: Secondary | ICD-10-CM | POA: Diagnosis not present

## 2018-03-26 DIAGNOSIS — Z3A39 39 weeks gestation of pregnancy: Secondary | ICD-10-CM | POA: Diagnosis not present

## 2018-03-26 DIAGNOSIS — O99214 Obesity complicating childbirth: Secondary | ICD-10-CM | POA: Diagnosis not present

## 2018-03-26 DIAGNOSIS — O134 Gestational [pregnancy-induced] hypertension without significant proteinuria, complicating childbirth: Principal | ICD-10-CM | POA: Diagnosis present

## 2018-03-26 DIAGNOSIS — O133 Gestational [pregnancy-induced] hypertension without significant proteinuria, third trimester: Secondary | ICD-10-CM

## 2018-03-26 DIAGNOSIS — Z880 Allergy status to penicillin: Secondary | ICD-10-CM

## 2018-03-26 LAB — CBC
HCT: 39.4 % (ref 36.0–46.0)
HCT: 39.5 % (ref 36.0–46.0)
Hemoglobin: 13 g/dL (ref 12.0–15.0)
Hemoglobin: 13.2 g/dL (ref 12.0–15.0)
MCH: 29.3 pg (ref 26.0–34.0)
MCH: 29.7 pg (ref 26.0–34.0)
MCHC: 33 g/dL (ref 30.0–36.0)
MCHC: 33.4 g/dL (ref 30.0–36.0)
MCV: 88.9 fL (ref 80.0–100.0)
MCV: 89 fL (ref 80.0–100.0)
Platelets: 222 10*3/uL (ref 150–400)
Platelets: 220 10*3/uL (ref 150–400)
RBC: 4.43 MIL/uL (ref 3.87–5.11)
RBC: 4.44 MIL/uL (ref 3.87–5.11)
RDW: 14.8 % (ref 11.5–15.5)
RDW: 14.9 % (ref 11.5–15.5)
WBC: 9.4 10*3/uL (ref 4.0–10.5)
WBC: 10.1 10*3/uL (ref 4.0–10.5)
nRBC: 0 % (ref 0.0–0.2)
nRBC: 0 % (ref 0.0–0.2)

## 2018-03-26 LAB — PROTEIN / CREATININE RATIO, URINE
Creatinine, Urine: 195 mg/dL
Protein Creatinine Ratio: 0.11 mg/mg{Cre} (ref 0.00–0.15)
Total Protein, Urine: 22 mg/dL

## 2018-03-26 LAB — URIC ACID: Uric Acid, Serum: 6.2 mg/dL (ref 2.5–7.1)

## 2018-03-26 LAB — COMPREHENSIVE METABOLIC PANEL
ALT: 18 U/L (ref 0–44)
AST: 26 U/L (ref 15–41)
Albumin: 2.6 g/dL — ABNORMAL LOW (ref 3.5–5.0)
Alkaline Phosphatase: 109 U/L (ref 38–126)
Anion gap: 7 (ref 5–15)
BUN: 12 mg/dL (ref 6–20)
CO2: 20 mmol/L — ABNORMAL LOW (ref 22–32)
Calcium: 8.8 mg/dL — ABNORMAL LOW (ref 8.9–10.3)
Chloride: 107 mmol/L (ref 98–111)
Creatinine, Ser: 0.58 mg/dL (ref 0.44–1.00)
GFR calc Af Amer: >60 mL/min (ref >60)
GFR calc non Af Amer: >60 mL/min (ref >60)
Glucose, Bld: 127 mg/dL — ABNORMAL HIGH (ref 70–99)
Potassium: 3.8 mmol/L (ref 3.5–5.1)
Sodium: 134 mmol/L — ABNORMAL LOW (ref 135–145)
Total Bilirubin: 0.5 mg/dL (ref 0.3–1.2)
Total Protein: 5.9 g/dL — ABNORMAL LOW (ref 6.5–8.1)

## 2018-03-26 LAB — TYPE AND SCREEN
ABO/RH(D): O POS
Antibody Screen: NEGATIVE

## 2018-03-26 LAB — LACTATE DEHYDROGENASE: LDH: 151 U/L (ref 98–192)

## 2018-03-26 LAB — RPR: RPR Ser Ql: NONREACTIVE

## 2018-03-26 MED ORDER — TERBUTALINE SULFATE 1 MG/ML IJ SOLN: 0.2500 mg | Freq: Once | INTRAMUSCULAR | Status: DC | PRN

## 2018-03-26 MED ORDER — SOD CITRATE-CITRIC ACID 500-334 MG/5ML PO SOLN: 30.0000 mL | ORAL | Status: DC | PRN

## 2018-03-26 MED ORDER — LACTATED RINGERS IV SOLN
500.0000 mL | INTRAVENOUS | Status: DC | PRN
  Administered 2018-03-26 (×2): 500 mL via INTRAVENOUS

## 2018-03-26 MED ORDER — EPHEDRINE 5 MG/ML INJ: 10.0000 mg | INTRAVENOUS | Status: DC | PRN

## 2018-03-26 MED ORDER — FENTANYL 2.5 MCG/ML BUPIVACAINE 1/10 % EPIDURAL INFUSION (WH - ANES)
14.0000 mL/h | INTRAMUSCULAR | Status: DC | PRN
  Administered 2018-03-26: 14 mL/h via EPIDURAL
  Filled 2018-03-26: qty 100

## 2018-03-26 MED ORDER — DIPHENHYDRAMINE HCL 50 MG/ML IJ SOLN: 12.5000 mg | INTRAMUSCULAR | Status: DC | PRN

## 2018-03-26 MED ORDER — PHENYLEPHRINE 40 MCG/ML (10ML) SYRINGE FOR IV PUSH (FOR BLOOD PRESSURE SUPPORT): 80.0000 ug | PREFILLED_SYRINGE | INTRAVENOUS | Status: DC | PRN

## 2018-03-26 MED ORDER — FLEET ENEMA 7-19 GM/118ML RE ENEM: 1.0000 | ENEMA | RECTAL | Status: DC | PRN

## 2018-03-26 MED ORDER — OXYTOCIN 40 UNITS IN LACTATED RINGERS INFUSION - SIMPLE MED
2.5000 [IU]/h | INTRAVENOUS | Status: DC
  Administered 2018-03-27: 2.5 [IU]/h via INTRAVENOUS
  Filled 2018-03-26: qty 1000

## 2018-03-26 MED ORDER — LACTATED RINGERS IV SOLN: 500.0000 mL | Freq: Once | INTRAVENOUS | Status: DC

## 2018-03-26 MED ORDER — MISOPROSTOL 25 MCG QUARTER TABLET
25.0000 ug | ORAL_TABLET | ORAL | Status: DC | PRN
  Administered 2018-03-26 (×3): 25 ug via VAGINAL
  Filled 2018-03-26 (×3): qty 1

## 2018-03-26 MED ORDER — OXYTOCIN 40 UNITS IN LACTATED RINGERS INFUSION - SIMPLE MED: 1.0000 m[IU]/min | INTRAVENOUS | Status: DC

## 2018-03-26 MED ORDER — OXYTOCIN BOLUS FROM INFUSION
500.0000 mL | Freq: Once | INTRAVENOUS | Status: AC
  Administered 2018-03-27: 500 mL via INTRAVENOUS

## 2018-03-26 MED ORDER — LACTATED RINGERS IV SOLN
INTRAVENOUS | Status: DC
  Administered 2018-03-26: 08:00:00 via INTRAVENOUS

## 2018-03-26 MED ORDER — LIDOCAINE HCL (PF) 1 % IJ SOLN
30.0000 mL | INTRAMUSCULAR | Status: AC | PRN
  Administered 2018-03-27: 30 mL via SUBCUTANEOUS
  Filled 2018-03-26: qty 30

## 2018-03-26 MED ORDER — PHENYLEPHRINE 40 MCG/ML (10ML) SYRINGE FOR IV PUSH (FOR BLOOD PRESSURE SUPPORT)
80.0000 ug | PREFILLED_SYRINGE | INTRAVENOUS | Status: DC | PRN
  Filled 2018-03-26: qty 10

## 2018-03-26 MED ORDER — ONDANSETRON HCL 4 MG/2ML IJ SOLN
4.0000 mg | Freq: Four times a day (QID) | INTRAMUSCULAR | Status: DC | PRN
  Administered 2018-03-26: 4 mg via INTRAVENOUS
  Filled 2018-03-26: qty 2

## 2018-03-26 MED ORDER — LIDOCAINE HCL (PF) 1 % IJ SOLN
INTRAMUSCULAR | Status: DC | PRN
  Administered 2018-03-26: 5 mL via EPIDURAL

## 2018-03-26 MED ORDER — ACETAMINOPHEN 325 MG PO TABS: 650.0000 mg | ORAL_TABLET | ORAL | Status: DC | PRN

## 2018-03-26 MED ORDER — FENTANYL CITRATE (PF) 100 MCG/2ML IJ SOLN
50.0000 ug | INTRAMUSCULAR | Status: DC | PRN
  Administered 2018-03-26: 100 ug via INTRAVENOUS
  Administered 2018-03-26: 50 ug via INTRAVENOUS
  Filled 2018-03-26 (×2): qty 2

## 2018-03-26 NOTE — Anesthesia Procedure Notes (Addendum)
Epidural Patient location during procedure: OB Start time: 03/26/2018 10:00 PM End time: 03/26/2018 10:16 PM  Staffing Anesthesiologist: Trevor Iha, MD Performed: anesthesiologist   Preanesthetic Checklist Completed: patient identified, site marked, surgical consent, pre-op evaluation, timeout performed, IV checked, risks and benefits discussed and monitors and equipment checked  Epidural Patient position: sitting Prep: site prepped and draped and DuraPrep Patient monitoring: continuous pulse ox and blood pressure Approach: midline Location: L3-L4 Injection technique: LOR air  Needle:  Needle type: Tuohy  Needle gauge: 17 G Needle length: 9 cm and 9 Needle insertion depth: 9 cm Catheter type: closed end flexible Catheter size: 19 Gauge Catheter at skin depth: 15 cm Test dose: negative  Assessment Events: blood not aspirated, injection not painful, no injection resistance, negative IV test and no paresthesia  Additional Notes Patient identified. Risks/Benefits/Options discussed with patient including but not limited to bleeding, infection, nerve damage, paralysis, failed block, incomplete pain control, headache, blood pressure changes, nausea, vomiting, reactions to medication both or allergic, itching and postpartum back pain. Confirmed with bedside nurse the patient's most recent platelet count. Confirmed with patient that they are not currently taking any anticoagulation, have any bleeding history or any family history of bleeding disorders. Patient expressed understanding and wished to proceed. All questions were answered. Sterile technique was used throughout the entire procedure. Please see nursing notes for vital signs. Test dose was given through epidural needle and negative prior to continuing to dose epidural or start infusion. Warning signs of high block given to the patient including shortness of breath, tingling/numbness in hands, complete motor block, or any  concerning symptoms with instructions to call for help. Patient was given instructions on fall risk and not to get out of bed. All questions and concerns addressed with instructions to call with any issues. 1 Attempt (S) . Patient tolerated procedure well.

## 2018-03-26 NOTE — Progress Notes (Addendum)
Labor Progress Note  Monique Bryan is a 28 y.o. female, G1P0000, IUP at 39 weeks, presenting for IOL for GHTN and obesity. GBS negative.  Subjective: Pt resting in bed with tears in her eyes, pt expresses feeling of discouragement due to how long it is taking, reassurance given, epidural encouraged due to pt pain increasing/, pt denies HA, vision changes, no RUQ pain. Pt endorse fells small light cxt, but are very tolerable.  Patient Active Problem List   Diagnosis Date Noted  . PIH (pregnancy induced hypertension), antepartum 03/26/2018  . Gestational hypertension 03/15/2018   Objective: BP (!) 142/82 (BP Location: Right Arm)   Pulse 66   Temp 97.8 F (36.6 C) (Oral)   Resp 18   Ht 5\' 10"  (1.778 m)   Wt (!) 158.8 kg   LMP 06/27/2017   BMI 50.22 kg/m  No intake/output data recorded. No intake/output data recorded. NST: FHR baseline 145 bpm, Variability: moderate, Accelerations:not present, Decelerations:  Absent= Cat 2/Non-Reactive. Post fluid bolus and maternal position change became reactive.   CTX:  irregular, every 1.5-4 minutes, lasting 40-80 seconds Uterus gravid, soft non tender, mild to palpate with contractions.  SVE:  Dilation: 1cm Effacement (%): 90% Station: -1 Exam by:: Check by Dale [place] CNM Pitocin at (Will start once cervix is ripe) mUn/min  Assessment:  Monique Bryan is a 28 y.o. female, G1P0000, IUP at 39 weeks, presenting for IOL for GHTN and obesity. GBS negative. Pt stable and felling very small cxt. Pt tired and wants to nap.  Patient Active Problem List   Diagnosis Date Noted  . PIH (pregnancy induced hypertension), antepartum 03/26/2018  . Gestational hypertension 03/15/2018   NICHD: Category 2, maternal position change, and fluid bolus started. Cat 2 resolved to Cat 1.   Membranes:  Intact, no s/s of infection  Induction:    Cytotec x1 @ 0840 x2 @ 1240, x3 @ 1640 on 01/06  Foley Bulb: fetus head applied to cervix, unable to place at  this time, pt in pain.   Pitocin - will start once cervix is ripened   Pain management:               IV pain management: x 1 fentanyl @ 2045  Nitrous: PRN             Epidural placement:  Requested @2115   GBS Negative  GHT: Asymptomatic, BP 142/82, Pre E Labs neg, PCR 0.11   Plan: Continue labor plan Continuous/intermittent monitoring Rest Ambulate Frequent position changes to facilitate fetal rotation and descent. Will reassess with cervical exam if necessary and indicated.  Plans to place foley bulb if indicated post epidural placement Then anticipate pitocin per protocol if cxt space out.  Epidural placement now Continue to monitor BP.   Anticipate foley bulb placement when cervix dilates to 1cm.   Anticipate labor progression and vaginal delivery.   Md Pinn to be updated PRN  Dale Elkins, NP-C, CNM, MSN 03/26/2018. 9:32 PM

## 2018-03-26 NOTE — H&P (Signed)
Monique Bryan is a 28 y.o. female, G1P0000, IUP at 73 weeks, presenting for IOL for GHTN and obesity. GBS negative. Pt denies HA, vision changes nor RUQ pain. Pt endorse + Fm. Denies vaginal leakage. Denies vaginal bleeding. Denies feeling cxt's.   Patient Active Problem List   Diagnosis Date Noted  . PIH (pregnancy induced hypertension), antepartum 03/26/2018  . Gestational hypertension 03/15/2018   Medications Prior to Admission  Medication Sig Dispense Refill Last Dose  . acetaminophen (TYLENOL) 500 MG tablet Take 500 mg by mouth every 6 (six) hours as needed (for mouth pain).   03/25/2018 at Unknown time  . amoxicillin (AMOXIL) 500 MG capsule Take 500 mg by mouth as needed (patent having a tooth pulled  after she delivers the baby).   03/24/2018    Past Medical History:  Diagnosis Date  . Medical history non-contributory   . Pregnancy induced hypertension      No current facility-administered medications on file prior to encounter.    Current Outpatient Medications on File Prior to Encounter  Medication Sig Dispense Refill  . acetaminophen (TYLENOL) 500 MG tablet Take 500 mg by mouth every 6 (six) hours as needed (for mouth pain).    Marland Kitchen amoxicillin (AMOXIL) 500 MG capsule Take 500 mg by mouth as needed (patent having a tooth pulled  after she delivers the baby).       No Known Allergies  History of present pregnancy: Pt Info/Preference:  Screening/Consents:  Labs:   EDD: Estimated Date of Delivery: 04/03/18  Establised: Patient's last menstrual period was 06/27/2017.  Anatomy Scan: Date: 12/11/2017 Placenta Location: anterior Genetic Screen: Panoroma:low risk female AFP:  First Tri: Quad:  Office: [facility]            First PNV: 13.6 wg Blood Type O/Positive/-- (07/10 0000)  Language: english Last PNV: 38 Rhogam    Flu Vaccine:  declined   Antibody Negative (07/10 0000)  TDaP vaccine UTD   GTT: Early: 5.2 hga1c Third Trimester: normal  Feeding Plan: breast BTL: no Rubella:  Immune (07/10 0000)  Contraception: ??? VBAC: mo RPR: Nonreactive (07/10 0000)   Circumcision: N/a female   HBsAg: Negative (07/10 0000)  Pediatrician:  [facility]   HIV: Non-reactive (07/10 0000)   Prenatal Classes: no Additional [place]: Growth and BPP last bpp was 8/8 on 12/9 GBS:  (For PCN allergy, check sensitivities)       Chlamydia: neg    MFM Referral/Consult:  GC: neg  Support Person: mother   PAP: ???  Pain Management: epidural Neonatologist Referral:  Hgb Electrophoresis:  AA  Birth Plan: none   Hgb NOB: 13.2    28W: 11.6  02/09/2018 growth:  OB History    Gravida  1   Para      Term      Preterm      AB      Living        SAB      TAB      Ectopic      Multiple      Live Births             Past Medical History:  Diagnosis Date  . Medical history non-contributory   . Pregnancy induced hypertension    Past Surgical History:  Procedure Laterality Date  . TONSILLECTOMY     Family History: family history includes Sickle cell trait in her mother. Social History:  reports that she has never smoked. She has never used smokeless tobacco. She  reports previous drug use. Drug: Marijuana. She reports that she does not drink alcohol.   Prenatal Transfer Tool  Maternal Diabetes: No Genetic Screening: Normal Maternal Ultrasounds/Referrals: Normal Fetal Ultrasounds or other Referrals:  None Maternal Substance Abuse:  No Significant Maternal Medications:  None Significant Maternal Lab Results: Lab values include: Group B Strep negative  ROS:  Review of Systems  Constitutional: Negative.   HENT: Negative.   Eyes: Negative.   Respiratory: Negative.   Cardiovascular: Negative.   Gastrointestinal: Negative.   Genitourinary: Negative.   Musculoskeletal: Negative.   Skin: Negative.   Neurological: Negative.   Endo/Heme/Allergies: Negative.   Psychiatric/Behavioral: Negative.      Physical Exam: BP 122/68   Pulse 76   Temp 98.5 F (36.9 C) (Oral)   Resp 18    LMP 06/27/2017   Physical Exam  Constitutional: She is oriented to person, place, and time and well-developed, well-nourished, and in no distress.  HENT:  Head: Normocephalic and atraumatic.  Eyes: Pupils are equal, round, and reactive to light. Conjunctivae are normal.  Neck: Normal range of motion. Neck supple.  Cardiovascular: Normal rate and regular rhythm.  Pulmonary/Chest: Effort normal and breath sounds normal.  Abdominal: Soft. Bowel sounds are normal.  Genitourinary:    Genitourinary Comments: pelvic adequate for vaginal delivery, uterus soften non tender, gravida grater than dates.   Musculoskeletal: Normal range of motion.  Neurological: She is alert and oriented to person, place, and time. She has normal reflexes. Gait normal.  Skin: Skin is warm and dry.  Psychiatric: Affect normal.  Nursing note and vitals reviewed.    NST: FHR baseline 135 bpm, Variability: moderate, Accelerations:present, Decelerations:  Absent= Cat 1/Reactive UC:   none SVE:   Dilation: Closed Effacement (%): Thick Station: -3 Exam by:: s grindstaff rn, vertex verified by fetal sutures.  Leopold's: Position vertex, EFW 8lbs via leopold's.  Bedside US verified vertex.   Labs: Results for orders placed or performed during the hospital encounter of 03/26/18 (from the past 24 hour(s))  CBC     Status: None   Collection Time: 03/26/18  8:17 AM  Result Value Ref Range   WBC 9.4 4.0 - 10.5 K/uL   RBC 4.43 3.87 - 5.11 MIL/uL   Hemoglobin 13.0 12.0 - 15.0 g/dL   HCT 82.9 56.2 - 13.0 %   MCV 88.9 80.0 - 100.0 fL   MCH 29.3 26.0 - 34.0 pg   MCHC 33.0 30.0 - 36.0 g/dL   RDW 86.5 78.4 - 69.6 %   Platelets 222 150 - 400 K/uL   nRBC 0.0 0.0 - 0.2 %    Imaging:  No results found.  MAU Course: Orders Placed This Encounter  Procedures  . CBC  . RPR  . Diet clear liquid Room service appropriate? Yes; Fluid consistency: Thin  . Discontinue Pitocin if tachysystole with non-reassuring FHR is present   . Evaluate fetal heart rate to establish reassuring pattern prior to initiating Cytotec or Pitocin  . If tachysystole WITH reassuring FHR present notify MD / CNM  . Initiate intrauterine resuscitation if tachysystole with non-reasuring FHR is present  . Labor Induction  . May administer Terbutaline 0.25 mg SQ x 1 dose if tachysystole with non-reassuring FHR is presesnt  . Nofify MD/CNM if tachysystole with non-reassuring FHR is present  . Perform a cervical exam prior to initiating Cytotec or Pitocin  . Activity as tolerated  . Fetal monitoring per unit policy  . Notify Physician  . Vitals signs per  unit policy  . Order Rapid HIV per protocol if no results on chart  . Cervical Exam  . Discontinue foley prior to vaginal delivery  . Fundal check post delivery every 15 min x 1 hour then every 30 min x 1 hour  . If Rapid HIV test positive or known HIV positive: initiate AZT orders  . Initiate Carrier Fluid Protocol  . Initiate Oral Care Protocol  . Insert foley catheter  . May in and out cath x 2 for inability to void  . Measure blood pressure post delivery every 15 min x 1 hour then every 30 min x 1 hour  . Patient may have epidural placement upon request  . Full code  . Oxygen therapy  . Type and screen  . Insert and maintain IV Line  . Admit to Inpatient (patient's expected length of stay will be greater than 2 midnights or inpatient only procedure)   Meds ordered this encounter  Medications  . misoprostol (CYTOTEC) tablet 25 mcg  . oxytocin (PITOCIN) IV infusion 40 units in LR 1000 mL - Premix    Order Specific Question:   Begin infusion at:    Answer:   1 milli-unit/min (1.5 mL/hr)    Order Specific Question:   Increase infusion by:    Answer:   1 milli-unit/min (1.5 mL/hr)  . terbutaline (BRETHINE) injection 0.25 mg  . lactated ringers infusion  . lactated ringers infusion 500-1,000 mL  . oxytocin (PITOCIN) IV BOLUS FROM BAG  . oxytocin (PITOCIN) IV infusion 40 units in LR  1000 mL - Premix  . acetaminophen (TYLENOL) tablet 650 mg  . fentaNYL (SUBLIMAZE) injection 50-100 mcg  . lidocaine (PF) (XYLOCAINE) 1 % injection 30 mL  . ondansetron (ZOFRAN) injection 4 mg  . sodium citrate-citric acid (ORACIT) solution 30 mL  . sodium phosphate (FLEET) 7-19 GM/118ML enema 1 enema    Assessment/Plan: Ishmael HolterMadelyn Bonifas is a 28 y.o. female, G1P0000, IUP at 39 weeks, presenting for IOL for GHTN and obesity. GBS-. Pt stable. Pt BP 122/78, asymptomatic.   FWB: Cat 1 Fetal Tracing.   Plan: Admit to Birthing Suite per consult with Pinn Routine [facility] orders Pain med/epidural prn GHTN: Baseline preeclampsia labs Anticipate Cytotec for bishop score of 2 and labor progression   Dale DurhamJade Aleksandra Raben NP-C, CNM, MSN 03/26/2018, 10:13 AM

## 2018-03-26 NOTE — Progress Notes (Signed)
toco adjusted several times.  Pt contraction unable to trace

## 2018-03-26 NOTE — Progress Notes (Addendum)
Labor Progress Note  Monique Bryan is a 28 y.o. female, G1P0000, IUP at 39 weeks, presenting for IOL for GHTN and obesity. GBS negative.  Subjective: Pt resting quietly in bed with family at bedside, pt denies HA, vision changes, no RUQ pain. Pt endorse fells small light cxt, but are very tolerable. I explained about the induction process and how long it takes. Pt verbalized understand.  I discussed with patient risks, benefits and alternatives of labor induction including higher risk of cesarean delivery compared to spontaneous labor.  We discussed risks of induction agents including effects on fetal heart beat, contraction pattern and need for close monitoring.  Patient expressed understanding of all this and desired to proceed with the induction.  Risks and benefits of induction were reviewed, including failure of method, prolonged labor, need for further intervention, risk of cesarean.  Patient and family verbalized understanding and denies any further questions at this time. Pt and family wish to proceed with induction process. Discussed induction options of Cytotec, foley bulb, AROM, and pitocin were reviewed as well as risks and benefits with use of each discussed.   Patient Active Problem List   Diagnosis Date Noted  . PIH (pregnancy induced hypertension), antepartum 03/26/2018  . Gestational hypertension 03/15/2018   Objective: BP 129/69   Pulse 75   Temp 98.4 F (36.9 C) (Axillary)   Resp 18   Ht 5\' 10"  (1.778 m)   Wt (!) 158.8 kg   LMP 06/27/2017   BMI 50.22 kg/m  No intake/output data recorded. No intake/output data recorded. NST: FHR baseline 140 bpm, Variability: moderate, Accelerations:not present, Decelerations:  Absent= Cat 1/Has been reactive over last hour but last 20 mins, fetus appears to be in a sleep cycle.  CTX:  irregular, every 3-5 minutes, lasting 40-100 seconds Uterus gravid, soft non tender, mild to palpate with contractions.  SVE:  Dilation:  Closed Effacement (%): thick Station: -3 Exam by:: Check by RN Pitocin at (Will start once cervix is ripe) mUn/min  Assessment:  Monique Bryan is a 28 y.o. female, G1P0000, IUP at 39 weeks, presenting for IOL for GHTN and obesity. GBS negative. Pt stable and felling very small cxt.  Patient Active Problem List   Diagnosis Date Noted  . PIH (pregnancy induced hypertension), antepartum 03/26/2018  . Gestational hypertension 03/15/2018   NICHD: Category 1  Membranes:  Intact, no s/s of infection  Induction:    Cytotec x1 @ 0840 x2 @ 1240 on 01/06  Foley Bulb: Cervix closed  Pitocin - will start once cervix is ripened   Pain management:               IV pain management: x PRN  Nitrous: PRN             Epidural placement:  PRN  GBS Negative  GHT:  asymtopmatic, 118/75   Plan: Continue labor plan Continuous/intermittent monitoring Rest Ambulate Frequent position changes to facilitate fetal rotation and descent. Will reassess with cervical exam at 1640 or earlier if necessary Plan to continue to place Cytotec  Anticipate foley bulb placement when cervix dilates to 1cm.  Plan to start pitocin per protocol once cervix is ripe Anticipate labor progression and vaginal delivery.   Md Pinn to be updated PRN  [facility] RehabilitationJade Jader Desai, NP-C, CNM, MSN 03/26/2018. 4:55 PM

## 2018-03-26 NOTE — Progress Notes (Signed)
Pt feeling discouraged about induction of labor stating that it is taking a long time with little progress.  Emotional support given

## 2018-03-26 NOTE — Anesthesia Pain Management Evaluation Note (Signed)
  CRNA Pain Management Visit Note  Patient: Monique Bryan, 28 y.o., female  "Hello I am a member of the anesthesia team at [facility]. We have an anesthesia team available at all times to provide care throughout the hospital, including epidural management and anesthesia for C-section. I don't know your plan for the delivery whether it a natural birth, water birth, IV sedation, nitrous supplementation, doula or epidural, but we want to meet your pain goals."   1.Was your pain managed to your expectations on prior hospitalizations?   No prior hospitalizations  2.What is your expectation for pain management during this hospitalization?     Epidural  3.How can we help you reach that goal?   Record the patient's initial score and the patient's pain goal.   Pain: 0  Pain Goal: 8 [facility] wants you to be able to say your pain was always managed very well.  Laban Emperor 03/26/2018

## 2018-03-26 NOTE — Progress Notes (Addendum)
Labor Progress Note  Monique Bryan is a 28 y.o. female, G1P0000, IUP at 39 weeks, presenting for IOL for GHTN and obesity. GBS negative.  Subjective: Pt resting quietly in bed with family at bedside, pt denies HA, vision changes, no RUQ pain. Pt endorse fells small light cxt, but are very tolerable.  Patient Active Problem List   Diagnosis Date Noted  . PIH (pregnancy induced hypertension), antepartum 03/26/2018  . Gestational hypertension 03/15/2018   Objective: BP (!) 111/43   Pulse 71   Temp 98.4 F (36.9 C) (Axillary)   Resp 18   Ht 5\' 10"  (1.778 m)   Wt (!) 158.8 kg   LMP 06/27/2017   BMI 50.22 kg/m  No intake/output data recorded. No intake/output data recorded. NST: FHR baseline 145 bpm, Variability: moderate, Accelerations:present, Decelerations:  Absent= Cat 1/Reactive.  CTX:  irregular, every 3-5 minutes, lasting 40-50 seconds Uterus gravid, soft non tender, mild to palpate with contractions.  SVE:  Dilation: Closed Effacement (%): 20-30% Station: -2 Exam by:: Check by Dale Highland Holiday CNM Pitocin at (Will start once cervix is ripe) mUn/min  Assessment:  Monique Bryan is a 28 y.o. female, G1P0000, IUP at 39 weeks, presenting for IOL for GHTN and obesity. GBS negative. Pt stable and felling very small cxt. Pt tired and wants to nap.  Patient Active Problem List   Diagnosis Date Noted  . PIH (pregnancy induced hypertension), antepartum 03/26/2018  . Gestational hypertension 03/15/2018   NICHD: Category 1  Membranes:  Intact, no s/s of infection  Induction:    Cytotec x1 @ 0840 x2 @ 1240, x3 @ 1640 on 01/06  Foley Bulb:  Cervix closed  Pitocin - will start once cervix is ripened   Pain management:               IV pain management: x PRN  Nitrous: PRN             Epidural placement:  PRN  GBS Negative  GHT: Asymptomatic, BP 111/43   Plan: Continue labor plan Continuous/intermittent monitoring Rest Ambulate Frequent position changes to  facilitate fetal rotation and descent. Will reassess with cervical exam at 1640 or earlier if necessary Plan to continue to place Cytotec  Anticipate foley bulb placement when cervix dilates to 1cm.  Plan to start pitocin per protocol once cervix is ripe Anticipate labor progression and vaginal delivery.   Md Pinn to be updated PRN  [facility], NP-C, CNM, MSN 03/26/2018. 5:11 PM

## 2018-03-26 NOTE — Progress Notes (Signed)
Pt turned from semifowlers to right lateral.  Unable to trace fhr in this position.  Pt turned to left side and now fhr tracing

## 2018-03-26 NOTE — Progress Notes (Signed)
Wrong chart

## 2018-03-26 NOTE — Anesthesia Preprocedure Evaluation (Signed)
Anesthesia Evaluation  Patient identified by MRN, date of birth, ID band Patient awake    Reviewed: Allergy & Precautions, H&P , NPO status , Patient's Chart, lab work & pertinent test results  Airway Mallampati: III  TM Distance: >3 FB Neck ROM: Full    Dental no notable dental hx. (+) Teeth Intact   Pulmonary neg pulmonary ROS,    Pulmonary exam normal breath sounds clear to auscultation       Cardiovascular hypertension, Pt. on medications Normal cardiovascular exam Rhythm:Regular Rate:Normal     Neuro/Psych negative neurological ROS  negative psych ROS   GI/Hepatic negative GI ROS, Neg liver ROS,   Endo/Other  Morbid obesity  Renal/GU negative Renal ROS     Musculoskeletal   Abdominal   Peds  Hematology   Anesthesia Other Findings   Reproductive/Obstetrics (+) Pregnancy                             Lab Results  Component Value Date   WBC 10.1 03/26/2018   HGB 13.2 03/26/2018   HCT 39.5 03/26/2018   MCV 89.0 03/26/2018   PLT 220 03/26/2018    Anesthesia Physical Anesthesia Plan  ASA: IV  Anesthesia Plan: Epidural   Post-op Pain Management:    Induction:   PONV Risk Score and Plan:   Airway Management Planned:   Additional Equipment:   Intra-op Plan:   Post-operative Plan:   Informed Consent: I have reviewed the patients History and Physical, chart, labs and discussed the procedure including the risks, benefits and alternatives for the proposed anesthesia with the patient or authorized representative who has indicated his/her understanding and acceptance.     Plan Discussed with:   Anesthesia Plan Comments: (PIH, increased BMI 50)        Anesthesia Quick Evaluation

## 2018-03-27 ENCOUNTER — Encounter (HOSPITAL_COMMUNITY): Payer: Self-pay

## 2018-03-27 LAB — CBC
HCT: 38.1 % (ref 36.0–46.0)
Hemoglobin: 12.5 g/dL (ref 12.0–15.0)
MCH: 28.9 pg (ref 26.0–34.0)
MCHC: 32.8 g/dL (ref 30.0–36.0)
MCV: 88 fL (ref 80.0–100.0)
Platelets: 215 10*3/uL (ref 150–400)
RBC: 4.33 MIL/uL (ref 3.87–5.11)
RDW: 14.8 % (ref 11.5–15.5)
WBC: 14.1 10*3/uL — ABNORMAL HIGH (ref 4.0–10.5)
nRBC: 0 % (ref 0.0–0.2)

## 2018-03-27 MED ORDER — TETANUS-DIPHTH-ACELL PERTUSSIS 5-2.5-18.5 LF-MCG/0.5 IM SUSP: 0.5000 mL | Freq: Once | INTRAMUSCULAR | Status: DC

## 2018-03-27 MED ORDER — BENZOCAINE-MENTHOL 20-0.5 % EX AERO
1.0000 "application " | INHALATION_SPRAY | CUTANEOUS | Status: DC | PRN
  Administered 2018-03-27: 1 via TOPICAL
  Filled 2018-03-27 (×2): qty 56

## 2018-03-27 MED ORDER — ZOLPIDEM TARTRATE 5 MG PO TABS: 5.0000 mg | ORAL_TABLET | Freq: Every evening | ORAL | Status: DC | PRN

## 2018-03-27 MED ORDER — ONDANSETRON HCL 4 MG PO TABS
4.0000 mg | ORAL_TABLET | ORAL | Status: DC | PRN
  Filled 2018-03-27: qty 1

## 2018-03-27 MED ORDER — ONDANSETRON HCL 4 MG/2ML IJ SOLN: 4.0000 mg | INTRAMUSCULAR | Status: DC | PRN

## 2018-03-27 MED ORDER — TRANEXAMIC ACID-NACL 1000-0.7 MG/100ML-% IV SOLN
1000.0000 mg | INTRAVENOUS | Status: AC
  Administered 2018-03-27: 1000 mg via INTRAVENOUS

## 2018-03-27 MED ORDER — ACETAMINOPHEN 325 MG PO TABS: 650.0000 mg | ORAL_TABLET | ORAL | Status: DC | PRN

## 2018-03-27 MED ORDER — SENNOSIDES-DOCUSATE SODIUM 8.6-50 MG PO TABS
2.0000 | ORAL_TABLET | ORAL | Status: DC
  Administered 2018-03-28 – 2018-03-29 (×2): 2 via ORAL
  Filled 2018-03-27 (×2): qty 2

## 2018-03-27 MED ORDER — MISOPROSTOL 200 MCG PO TABS
ORAL_TABLET | ORAL | Status: AC
  Filled 2018-03-27: qty 4

## 2018-03-27 MED ORDER — SIMETHICONE 80 MG PO CHEW: 80.0000 mg | CHEWABLE_TABLET | ORAL | Status: DC | PRN

## 2018-03-27 MED ORDER — DIBUCAINE 1 % RE OINT
1.0000 "application " | TOPICAL_OINTMENT | RECTAL | Status: DC | PRN
  Filled 2018-03-27: qty 28

## 2018-03-27 MED ORDER — PRENATAL MULTIVITAMIN CH
1.0000 | ORAL_TABLET | Freq: Every day | ORAL | Status: DC
  Administered 2018-03-27 – 2018-03-29 (×3): 1 via ORAL
  Filled 2018-03-27 (×3): qty 1

## 2018-03-27 MED ORDER — IBUPROFEN 600 MG PO TABS
600.0000 mg | ORAL_TABLET | Freq: Four times a day (QID) | ORAL | Status: DC
  Administered 2018-03-27 – 2018-03-29 (×10): 600 mg via ORAL
  Filled 2018-03-27 (×10): qty 1

## 2018-03-27 MED ORDER — DIPHENHYDRAMINE HCL 25 MG PO CAPS: 25.0000 mg | ORAL_CAPSULE | Freq: Four times a day (QID) | ORAL | Status: DC | PRN

## 2018-03-27 MED ORDER — METHYLERGONOVINE MALEATE 0.2 MG/ML IJ SOLN: 0.2000 mg | Freq: Once | INTRAMUSCULAR | Status: DC

## 2018-03-27 MED ORDER — MISOPROSTOL 200 MCG PO TABS: 1000.0000 ug | ORAL_TABLET | Freq: Once | ORAL | Status: DC

## 2018-03-27 MED ORDER — WITCH HAZEL-GLYCERIN EX PADS: 1.0000 "application " | MEDICATED_PAD | CUTANEOUS | Status: DC | PRN

## 2018-03-27 MED ORDER — METHYLERGONOVINE MALEATE 0.2 MG/ML IJ SOLN
INTRAMUSCULAR | Status: AC
  Administered 2018-03-27: 0.2 mg
  Filled 2018-03-27: qty 1

## 2018-03-27 MED ORDER — TRANEXAMIC ACID-NACL 1000-0.7 MG/100ML-% IV SOLN
INTRAVENOUS | Status: AC
  Filled 2018-03-27: qty 100

## 2018-03-27 MED ORDER — AMOXICILLIN 500 MG PO CAPS: 500.0000 mg | ORAL_CAPSULE | ORAL | Status: DC | PRN

## 2018-03-27 MED ORDER — COCONUT OIL OIL
1.0000 "application " | TOPICAL_OIL | Status: DC | PRN
  Administered 2018-03-29: 1 via TOPICAL
  Filled 2018-03-27 (×2): qty 120

## 2018-03-27 NOTE — Anesthesia Postprocedure Evaluation (Signed)
Anesthesia Post Note  Patient: Monique Bryan  Procedure(s) Performed: AN AD HOC LABOR EPIDURAL     Patient location during evaluation: Mother Baby Anesthesia Type: Epidural Level of consciousness: awake and alert and oriented Pain management: satisfactory to patient Vital Signs Assessment: post-procedure vital signs reviewed and stable Respiratory status: spontaneous breathing and nonlabored ventilation Cardiovascular status: stable Postop Assessment: no headache, no backache, no signs of nausea or vomiting, adequate PO intake, patient able to bend at knees and no apparent nausea or vomiting (patient up walking) Anesthetic complications: no    Last Vitals:  Vitals:   03/27/18 0350 03/27/18 0747  BP: (!) 122/56 (!) 110/58  Pulse: 67 72  Resp: 16 16  Temp: 36.9 C 37.3 C  SpO2: 99% 99%    Last Pain:  Vitals:   03/27/18 0747  TempSrc: Oral  PainSc:    Pain Goal:                 Madison Hickman

## 2018-03-27 NOTE — Progress Notes (Signed)
Vacuum Assisted Delivery Note  Late entry. I was called to room with patient complete and pushing and 10 minute prolonged deceleration. Interventions had been trialed by CNM (see delivery note for details). I discussed with patient indication for operative vaginal delivery given +2 station and prolonged deceleration.   Risks of vacuum assistance were discussed in detail, including but not limited to, bleeding, infection, damage to maternal tissues, fetal cephalohematoma, inability to effect vaginal delivery of the head or shoulder dystocia that cannot be resolved by established maneuvers and need for emergency cesarean section.  Patient gave verbal consent.  Patient was examined and found to be fully dilated with fetal station of +2. The Kiwi vacuum was positioned over the sagittal suture. Pressure was then increased to green, and the patient was instructed to push.  Pulling was administered along the pelvic curve.  Only 1 pull during a contraction with 2 pushes, infant was delivered atraumatically. No pop offs. Infant noted to be a viable female, Apgars of 8 and 9, weight was 6 pounds 7.2 ounces.  After cord cut, and infant passed to NICU team, delivery of placenta and any repairs were performed by Azzie Almas, [place].  Please see her delivery note for additional details.   Monique Bryan. Earlene Plater, DO OB/GYN Fellow

## 2018-03-27 NOTE — Plan of Care (Signed)
  Problem: Education: Goal: Knowledge of Childbirth will improve Outcome: Completed/Met Goal: Ability to make informed decisions regarding treatment and plan of care will improve Outcome: Completed/Met Goal: Ability to state and carry out methods to decrease the pain will improve Outcome: Completed/Met   Problem: Coping: Goal: Ability to verbalize concerns and feelings about labor and delivery will improve Outcome: Completed/Met   Problem: Life Cycle: Goal: Ability to make normal progression through stages of labor will improve Outcome: Completed/Met Goal: Ability to effectively push during vaginal delivery will improve Outcome: Completed/Met   Problem: Role Relationship: Goal: Ability to demonstrate positive interaction with the child will improve Outcome: Completed/Met   Problem: Safety: Goal: Risk of complications during labor and delivery will decrease Outcome: Completed/Met   Problem: Pain Management: Goal: Relief or control of pain from uterine contractions will improve Outcome: Completed/Met

## 2018-03-27 NOTE — Lactation Note (Addendum)
This note was copied from a baby's chart. Lactation Consultation Note  Patient Name: Monique Bryan [id number] Date: 03/27/2018   g1p1 21 hours old female named Monique Bryan. Vag delivery. Vacuum assist. Entered room and mom reports it was time to eat.  Mom is unswaddling her .  Asked if I could observe and mom reported yes.Mom has very large breast and somewhat short shaft nipples.   Mom attempting to latch her in football hold.  Infant cuing/getting fussy/ mom puts her sts and she calms.  Asked mom if I could help.Mom said yes. Used manual pump to prepump and get nipples to evert.   Attempt to put infant in football hold and latch her.   She latched for a few minutes but will not maintain.   Used rolled up baby blankets under moms left breast and brought breast up.  Laid her across mom and after a few attempts infant latched and breastfed well,  Mom reported uterine pain with feeding.   Infant breastfed off and on for approximately 15 minutes.  Left mom and baby breastfeeding.  Mom asked if someone would help her later.  Asked night LC to follow up with her in a few hours.Left yellow feeding logs, urged mom to fill out voids,stools,feedings,emesis. Reviewed [facility] breastfeeding consultation services and breastfeeding resources handouts.  Mom able to hand express small drops of colostrum.  Urged mom to hand express and spoon feed back all expressed mother milk past breastfeeding.   Maternal Data    Feeding    LATCH Score                   Interventions    Lactation Tools Discussed/Used     Consult Status      Mohsin Crum Michaelle Copas 03/27/2018, 10:20 PM

## 2018-03-28 NOTE — Progress Notes (Addendum)
Post Partum Day 1 Subjective: no complaints, up ad lib, voiding and tolerating PO . Difficulty with latching and breastfeeding. Patient with mostly normotensive pressures with occasional elevation. Patient denies symptoms of preeclampsia.   Objective: Vitals:   03/27/18 1205 03/27/18 1650 03/27/18 2217 03/28/18 0609  BP: (!) 109/50 (!) 101/46 (!) 143/69 (!) 135/53  Pulse: 75 64 77 66  Resp: 18 18 20 20   Temp: 98 F (36.7 C) 97.6 F (36.4 C) 97.9 F (36.6 C) 97.7 F (36.5 C)  TempSrc: Oral Oral Oral Oral  SpO2: 98%  95% 97%  Weight:      Height:        Physical Exam:  General: alert and cooperative Lochia: appropriate Uterine Fundus: firm Incision: n/a DVT Evaluation: No evidence of DVT seen on physical exam. Negative Homan's sign. No cords or calf tenderness. No significant calf/ankle edema.  Recent Labs    03/26/18 2121 03/27/18 0543  HGB 13.2 12.5  HCT 39.5 38.1    Assessment/Plan: Plan for discharge tomorrow, Breastfeeding and Lactation consult. Continue to monitor blood pressures postpartum    LOS: 2 days   Janeece Riggers 03/28/2018, 7:40 AM

## 2018-03-28 NOTE — Lactation Note (Signed)
This note was copied from a baby's chart. Lactation Consultation Note  Patient Name: Monique Bryan Date: 03/28/2018 Reason for consult: Follow-up assessment;Difficult latch;Term P1, 30 hour female infant, weight loss -8% BF problems: difficulty with latching, mom with large short shaft breast, has breast shells and using 20 mm NS that was given by nurse. LC asked mom hand express only a smear of colostrum appears present at this time. LC entered room nurse was assisting mom with latch, infant was latched to  Mom's left breast in football hold position with NS, a few swallows observed infant breastfeed for 15 mintes. Per mom, infant not latched to right breast. Mom plans to start wearing breast shells that was given earlier to help elongate  nipple shaft out more prior to latching infant to breast. . Infant was supplemented with 8 ml of Gerber gentle with iron using a curve tip syringe. Mom was using DEBP . Mom will start pumping every 3 hours for breast stimulation and milk induction. BF plans: 1. Mom will breastfeed according hunger cues and not exceed 3 hours without breastfeeding infant. 2. Mom will supplement with EBM/ or  formula after breastfeeding based on infant's age/ hours.  3. Mom will pump every 3 hours for 15 minutes on initial setting. 4. Mom continue work towards latching infant to breast and ask for assistance if needed.  Maternal Data Formula Feeding for Exclusion: Yes Reason for exclusion: Mother's choice to formula and breast feed on admission  Feeding Feeding Type: Breast Fed  LATCH Score Latch: Repeated attempts needed to sustain latch, nipple held in mouth throughout feeding, stimulation needed to elicit sucking reflex.  Audible Swallowing: A few with stimulation  Type of Nipple: Flat  Comfort (Breast/Nipple): Soft / non-tender  Hold (Positioning): Assistance needed to correctly position infant at breast and maintain latch.  LATCH Score:  6  Interventions Interventions: Assisted with latch;Skin to skin;Hand express;DEBP;Position options  Lactation Tools Discussed/Used Tools: Shells;Nipple Shields Nipple shield size: 20   Consult Status Consult Status: Follow-up Date: 03/28/18 Follow-up type: In-patient    Danelle Earthly 03/28/2018, 6:13 AM

## 2018-03-29 MED ORDER — IBUPROFEN 600 MG PO TABS: 600.0000 mg | ORAL_TABLET | Freq: Four times a day (QID) | ORAL | 0 refills | Status: DC | PRN

## 2018-03-29 NOTE — Discharge Summary (Signed)
OB Discharge Summary     Patient Name: Monique Bryan DOB: 30-Apr-1990 MRN: [id number]  Date of admission: 03/26/2018 Delivering MD: Tamera Stands   Date of discharge: 03/29/2018  Admitting diagnosis: 39 WKS, INDUCTION Intrauterine pregnancy: [redacted]w[redacted]d     Secondary diagnosis:  Active Problems:   PIH (pregnancy induced hypertension), antepartum     Discharge diagnosis: Term Pregnancy Delivered and Gestational Hypertension                                                                                                Post partum procedures:n/a  Augmentation: AROM and Cytotec  Complications: None  Hospital course:  Induction of Labor With Vaginal Delivery   28 y.o. yo G1P1001 at [redacted]w[redacted]d was admitted to the hospital 03/26/2018 for induction of labor.  Indication for induction: Gestational hypertension.  Patient had an uncomplicated labor course as follows: Membrane Rupture Time/Date: 11:36 PM ,03/26/2018   Intrapartum Procedures: Episiotomy: None [1]                                         Lacerations:  2nd degree [3];Perineal [11]  Patient had delivery of a Viable infant.  Information for the patient's newborn:  Yeva, Obarr [[id number]]  Delivery Method: Vag-Spont   03/27/2018  Details of delivery can be found in separate delivery note.  Patient had a routine postpartum course. Patient is discharged home 03/29/18.  Physical exam  Vitals:   03/28/18 0609 03/28/18 1456 03/28/18 2131 03/29/18 0526  BP: (!) 135/53 130/60 (!) 120/58 118/61  Pulse: 66 70 83 87  Resp: 20 18 16 14   Temp: 97.7 F (36.5 C) 98.2 F (36.8 C) 97.8 F (36.6 C) 97.9 F (36.6 C)  TempSrc: Oral Oral Oral Oral  SpO2: 97%  99% 100%  Weight:      Height:       General: alert, cooperative and no distress Lochia: appropriate Uterine Fundus: firm Incision: N/A DVT Evaluation: No evidence of DVT seen on physical exam. Negative Homan's sign. No cords or calf tenderness. No significant calf/ankle  edema. Preeclampsia: patient denies symptoms of preeclampsia: headache, visual changes, RUQ pain   Labs: Lab Results  Component Value Date   WBC 14.1 (H) 03/27/2018   HGB 12.5 03/27/2018   HCT 38.1 03/27/2018   MCV 88.0 03/27/2018   PLT 215 03/27/2018   CMP Latest Ref Rng & Units 03/26/2018  Glucose 70 - 99 mg/dL 300(F)  BUN 6 - 20 mg/dL 12  Creatinine 1.10 - 2.11 mg/dL 1.73  Sodium 567 - 014 mmol/L 134(L)  Potassium 3.5 - 5.1 mmol/L 3.8  Chloride 98 - 111 mmol/L 107  CO2 22 - 32 mmol/L 20(L)  Calcium 8.9 - 10.3 mg/dL 1.0(V)  Total Protein 6.5 - 8.1 g/dL 5.9(L)  Total Bilirubin 0.3 - 1.2 mg/dL 0.5  Alkaline Phos 38 - 126 U/L 109  AST 15 - 41 U/L 26  ALT 0 - 44 U/L 18    Discharge instruction: per After Visit Summary  and "Baby and Me Booklet". Preeclampsia precautions discussed and handout given. Patient verbalizes she will call or present to [facility] if she develops preeclampsia symptoms.   After visit meds:  Allergies as of 03/29/2018   No Known Allergies     Medication List    STOP taking these medications   amoxicillin 500 MG capsule Commonly known as:  AMOXIL     TAKE these medications   acetaminophen 500 MG tablet Commonly known as:  TYLENOL Take 500 mg by mouth every 6 (six) hours as needed (for mouth pain).   ibuprofen 600 MG tablet Commonly known as:  ADVIL,MOTRIN Take 1 tablet (600 mg total) by mouth every 6 (six) hours as needed for moderate pain.       Diet: routine diet  Activity: Advance as tolerated. Pelvic rest for 6 weeks.   Outpatient follow up:[facility] RN to see patient at home starting next week for blood pressure checks, 6 weeks for postpartum visit Follow up Appt:No future appointments. Follow up Visit:No follow-ups on file.  Postpartum contraception: Undecided  Newborn Data: Live born female  Birth Weight: 6 lb 7.2 oz (2925 g) APGAR: 8, 9  Newborn Delivery   Birth date/time:  03/27/2018 00:03:00 Delivery type:  Vaginal, Vacuum  (Extractor)     Baby Feeding: Bottle and Breast Disposition:home with mother   03/29/2018 Janeece Riggers, CNM

## 2018-03-29 NOTE — Lactation Note (Signed)
This note was copied from a baby's chart. Lactation Consultation Note  Patient Name: Monique Bryan [id number] Date: 03/29/2018 Reason for consult: Follow-up assessment;Difficult latch;Primapara;1st time breastfeeding  P1 mother whose infant is now 64 hours old  Mother has decided to pump and bottle feed only.  She has been pumping approximately every 3 hours and is still not getting any EBM.  She performed hand expression and I corrected her technique.  She is unable to obtain any drops with hand expression yet.  I encouraged her to be diligent about pumping every three hours when she gets home and to include hand expression before/after pumping.  Mother verbalized understanding.  She is a [facility] participant and also has [employer].  She does not have a DEBP for home use.  She was planning on using her manual pump.  I advised against this and provided reasons why she should definitely obtain a DEBP.  Offered our [facility] loaner pump.  Mother sounds interested in doing this, however, she is going to call [facility] and [facility] now to see what her options are for obtaining a pump.  She will let me know if she would like a DEBP from the [facility] loaner supply.  RN updated.   Maternal Data Formula Feeding for Exclusion: No Has patient been taught Hand Expression?: Yes Does the patient have breastfeeding experience prior to this delivery?: No  Feeding    LATCH Score                   Interventions    Lactation Tools Discussed/Used WIC Program: Yes Initiated by:: Already initiated   Consult Status Consult Status: Complete Date: 03/29/18 Follow-up type: In-patient    Camil Hausmann R Asmara Backs 03/29/2018, 11:41 AM

## 2018-03-29 NOTE — Discharge Instructions (Signed)
Postpartum Care After Vaginal Delivery ° °The period of time right after you deliver your newborn is called the postpartum period. °What kind of medical care will I receive? °· You may continue to receive fluids and medicines through an IV tube inserted into one of your veins. °· If an incision was made near your vagina (episiotomy) or if you had some vaginal tearing during delivery, cold compresses may be placed on your episiotomy or your tear. This helps to reduce pain and swelling. °· You may be given a squirt bottle to use when you go to the bathroom. You may use this until you are comfortable wiping as usual. To use the squirt bottle, follow these steps: °? Before you urinate, fill the squirt bottle with warm water. Do not use hot water. °? After you urinate, while you are sitting on the toilet, use the squirt bottle to rinse the area around your urethra and vaginal opening. This rinses away any urine and blood. °? You may do this instead of wiping. As you start healing, you may use the squirt bottle before wiping yourself. Make sure to wipe gently. °? Fill the squirt bottle with clean water every time you use the bathroom. °· You will be given sanitary pads to wear. °How can I expect to feel? °· You may not feel the need to urinate for several hours after delivery. °· You will have some soreness and pain in your abdomen and vagina. °· If you are breastfeeding, you may have uterine contractions every time you breastfeed for up to several weeks postpartum. Uterine contractions help your uterus return to its normal size. °· It is normal to have vaginal bleeding (lochia) after delivery. The amount and appearance of lochia is often similar to a menstrual period in the first week after delivery. It will gradually decrease over the next few weeks to a dry, yellow-brown discharge. For most women, lochia stops completely by 6-8 weeks after delivery. Vaginal bleeding can vary from woman to woman. °· Within the first few  days after delivery, you may have breast engorgement. This is when your breasts feel heavy, full, and uncomfortable. Your breasts may also throb and feel hard, tightly stretched, warm, and tender. After this occurs, you may have milk leaking from your breasts. Your health care provider can help you relieve discomfort due to breast engorgement. Breast engorgement should go away within a few days. °· You may feel more sad or worried than normal due to hormonal changes after delivery. These feelings should not last more than a few days. If these feelings do not go away after several days, speak with your health care provider. °How should I care for myself? °· Tell your health care provider if you have pain or discomfort. °· Drink enough water to keep your urine clear or pale yellow. °· Wash your hands thoroughly with soap and water for at least 20 seconds after changing your sanitary pads, after using the toilet, and before holding or feeding your baby. °· If you are not breastfeeding, avoid touching your breasts a lot. Doing this can make your breasts produce more milk. °· If you become weak or lightheaded, or you feel like you might faint, ask for help before: °? Getting out of bed. °? Showering. °· Change your sanitary pads frequently. Watch for any changes in your flow, such as a sudden increase in volume, a change in color, the passing of large blood clots. If you pass a blood clot from your vagina,   save it to show to your health care provider. Do not flush blood clots down the toilet without having your health care provider look at them. °· Make sure that all your vaccinations are up to date. This can help protect you and your baby from getting certain diseases. You may need to have immunizations done before you leave the hospital. °· If desired, talk with your health care provider about methods of family planning or birth control (contraception). °How can I start bonding with my baby? °Spending as much time as  possible with your baby is very important. During this time, you and your baby can get to know each other and develop a bond. Having your baby stay with you in your room (rooming in) can give you time to get to know your baby. Rooming in can also help you become comfortable caring for your baby. Breastfeeding can also help you bond with your baby. °How can I plan for returning home with my baby? °· Make sure that you have a car seat installed in your vehicle. °? Your car seat should be checked by a certified car seat installer to make sure that it is installed safely. °? Make sure that your baby fits into the car seat safely. °· Ask your health care provider any questions you have about caring for yourself or your baby. Make sure that you are able to contact your health care provider with any questions after leaving the hospital. °This information is not intended to replace advice given to you by your health care provider. Make sure you discuss any questions you have with your health care provider. °Document Released: 01/02/2007 Document Revised: 08/10/2015 Document Reviewed: 02/09/2015 °Elsevier Interactive Patient Education © 2018 [employer] ° ° °Postpartum Depression and Baby Blues °The postpartum period begins right after the birth of a baby. During this time, there is often a great amount of joy and excitement. It is also a time of many changes in the life of the parents. Regardless of how many times a mother gives birth, each child brings new challenges and dynamics to the family. It is not unusual to have feelings of excitement along with confusing shifts in moods, emotions, and thoughts. All mothers are at risk of developing postpartum depression or the "baby blues." These mood changes can occur right after giving birth, or they may occur many months after giving birth. The baby blues or postpartum depression can be mild or severe. Additionally, postpartum depression can go away rather quickly, or it can  be a long-term condition. °What are the causes? °Raised hormone levels and the rapid drop in those levels are thought to be a main cause of postpartum depression and the baby blues. A number of hormones change during and after pregnancy. Estrogen and progesterone usually decrease right after the delivery of your baby. The levels of thyroid hormone and various cortisol steroids also rapidly drop. Other factors that play a role in these mood changes include major life events and genetics. °What increases the risk? °If you have any of the following risks for the baby blues or postpartum depression, know what symptoms to watch out for during the postpartum period. Risk factors that may increase the likelihood of getting the baby blues or postpartum depression include: °· Having a personal or family history of depression. °· Having depression while being pregnant. °· Having premenstrual mood issues or mood issues related to oral contraceptives. °· Having a lot of life stress. °· Having marital conflict. °· Lacking   a social support network. °· Having a baby with special needs. °· Having health problems, such as diabetes. ° °What are the signs or symptoms? °Symptoms of baby blues include: °· Brief changes in mood, such as going from extreme happiness to sadness. °· Decreased concentration. °· Difficulty sleeping. °· Crying spells, tearfulness. °· Irritability. °· Anxiety. ° °Symptoms of postpartum depression typically begin within the first month after giving birth. These symptoms include: °· Difficulty sleeping or excessive sleepiness. °· Marked weight loss. °· Agitation. °· Feelings of worthlessness. °· Lack of interest in activity or food. ° °Postpartum psychosis is a very serious condition and can be dangerous. Fortunately, it is rare. Displaying any of the following symptoms is cause for immediate medical attention. Symptoms of postpartum psychosis include: °· Hallucinations and delusions. °· Bizarre or disorganized  behavior. °· Confusion or disorientation. ° °How is this diagnosed? °A diagnosis is made by an evaluation of your symptoms. There are no medical or lab tests that lead to a diagnosis, but there are various questionnaires that a health care provider may use to identify those with the baby blues, postpartum depression, or psychosis. Often, a screening tool called the Edinburgh Postnatal Depression Scale is used to diagnose depression in the postpartum period. °How is this treated? °The baby blues usually goes away on its own in 1-2 weeks. Social support is often all that is needed. You will be encouraged to get adequate sleep and rest. Occasionally, you may be given medicines to help you sleep. °Postpartum depression requires treatment because it can last several months or longer if it is not treated. Treatment may include individual or group therapy, medicine, or both to address any social, physiological, and psychological factors that may play a role in the depression. Regular exercise, a healthy diet, rest, and social support may also be strongly recommended. °Postpartum psychosis is more serious and needs treatment right away. Hospitalization is often needed. °Follow these instructions at home: °· Get as much rest as you can. Nap when the baby sleeps. °· Exercise regularly. Some women find yoga and walking to be beneficial. °· Eat a balanced and nourishing diet. °· Do little things that you enjoy. Have a cup of tea, take a bubble bath, read your favorite magazine, or listen to your favorite music. °· Avoid alcohol. °· Ask for help with household chores, cooking, grocery shopping, or running errands as needed. Do not try to do everything. °· Talk to people close to you about how you are feeling. Get support from your partner, family members, friends, or other new moms. °· Try to stay positive in how you think. Think about the things you are grateful for. °· Do not spend a lot of time alone. °· Only take  over-the-counter or prescription medicine as directed by your health care provider. °· Keep all your postpartum appointments. °· Let your health care provider know if you have any concerns. °Contact a health care provider if: °You are having a reaction to or problems with your medicine. °Get help right away if: °· You have suicidal feelings. °· You think you may harm the baby or someone else. °This information is not intended to replace advice given to you by your health care provider. Make sure you discuss any questions you have with your health care provider. °Document Released: 12/10/2003 Document Revised: 08/13/2015 Document Reviewed: 12/17/2012 °Elsevier Interactive Patient Education © 2017 [employer] ° ° °Preeclampsia and Eclampsia ° °Preeclampsia is a serious condition that may develop during pregnancy. It   is also called toxemia of pregnancy. This condition causes high blood pressure along with other symptoms, such as swelling and headaches. These symptoms may develop as the condition gets worse. Preeclampsia may occur at 20 weeks of pregnancy or later. °Diagnosing and treating preeclampsia early is very important. If not treated early, it can cause serious problems for you and your baby. One problem it can lead to is eclampsia. Eclampsia is a condition that causes muscle jerking or shaking (convulsions or seizures) and other serious problems for the mother. During pregnancy, delivering your baby may be the best treatment for preeclampsia or eclampsia. For most women, preeclampsia and eclampsia symptoms go away after giving birth. °In rare cases, a woman may develop preeclampsia after giving birth (postpartum preeclampsia). This usually occurs within 48 hours after childbirth but may occur up to 6 weeks after giving birth. °What are the causes? °The cause of preeclampsia is not known. °What increases the risk? °The following risk factors make you more likely to develop preeclampsia: °· Being pregnant for  the first time. °· Having had preeclampsia during a past pregnancy. °· Having a family history of preeclampsia. °· Having high blood pressure. °· Being pregnant with more than one baby. °· Being 35 or older. °· Being African-American. °· Having kidney disease or diabetes. °· Having medical conditions such as lupus or blood diseases. °· Being very overweight (obese). °What are the signs or symptoms? °The earliest signs of preeclampsia are: °· High blood pressure. °· Increased protein in your urine. Your health care provider will check for this at every visit before you give birth (prenatal visit). °Other symptoms that may develop as the condition gets worse include: °· Severe headaches. °· Sudden weight gain. °· Swelling of the hands, face, legs, and feet. °· Nausea and vomiting. °· Vision problems, such as blurred or double vision. °· Numbness in the face, arms, legs, and feet. °· Urinating less than usual. °· Dizziness. °· Slurred speech. °· Abdominal pain, especially upper abdominal pain. °· Convulsions or seizures. °How is this diagnosed? °There are no screening tests for preeclampsia. Your health care provider will ask you about symptoms and check for signs of preeclampsia during your prenatal visits. You may also have tests that include: °· Urine tests. °· Blood tests. °· Checking your blood pressure. °· Monitoring your baby’s heart rate. °· Ultrasound. °How is this treated? °You and your health care provider will determine the treatment approach that is best for you. Treatment may include: °· Having more frequent prenatal exams to check for signs of preeclampsia, if you have an increased risk for preeclampsia. °· Medicine to lower your blood pressure. °· Staying in the hospital, if your condition is severe. There, treatment will focus on controlling your blood pressure and the amount of fluids in your body (fluid retention). °· Taking medicine (magnesium sulfate) to prevent seizures. This may be given as an  injection or through an IV. °· Taking a low-dose aspirin during your pregnancy. °· Delivering your baby early, if your condition gets worse. You may have your labor started with medicine (induced), or you may have a cesarean delivery. °Follow these instructions at home: °Eating and drinking ° °· Drink enough fluid to keep your urine pale yellow. °· Avoid caffeine. °Lifestyle °· Do not use any products that contain nicotine or tobacco, such as cigarettes and e-cigarettes. If you need help quitting, ask your health care provider. °· Do not use alcohol or drugs. °· Avoid stress as much as possible. Rest   and get plenty of sleep. °General instructions °· Take over-the-counter and prescription medicines only as told by your health care provider. °· When lying down, lie on your left side. This keeps pressure off your major blood vessels. °· When sitting or lying down, raise (elevate) your feet. Try putting some pillows underneath your lower legs. °· Exercise regularly. Ask your health care provider what kinds of exercise are best for you. °· Keep all follow-up and prenatal visits as told by your health care provider. This is important. °How is this prevented? °There is no known way of preventing preeclampsia or eclampsia from developing. However, to lower your risk of complications and detect problems early: °· Get regular prenatal care. Your health care provider may be able to diagnose and treat the condition early. °· Maintain a healthy weight. Ask your health care provider for help managing weight gain during pregnancy. °· Work with your health care provider to manage any long-term (chronic) health conditions you have, such as diabetes or kidney problems. °· You may have tests of your blood pressure and kidney function after giving birth. °· Your health care provider may have you take low-dose aspirin during your next pregnancy. °Contact a health care provider if: °· You have symptoms that your health care provider told  you may require more treatment or monitoring, such as: °? Headaches. °? Nausea or vomiting. °? Abdominal pain. °? Dizziness. °? Light-headedness. °Get help right away if: °· You have severe: °? Abdominal pain. °? Headaches that do not get better. °? Dizziness. °? Vision problems. °? Confusion. °? Nausea or vomiting. °· You have any of the following: °? A seizure. °? Sudden, rapid weight gain. °? Sudden swelling in your hands, ankles, or face. °? Trouble moving any part of your body. °? Numbness in any part of your body. °? Trouble speaking. °? Abnormal bleeding. °· You faint. °Summary °· Preeclampsia is a serious condition that may develop during pregnancy. It is also called toxemia of pregnancy. °· This condition causes high blood pressure along with other symptoms, such as swelling and headaches. °· Diagnosing and treating preeclampsia early is very important. If not treated early, it can cause serious problems for you and your baby. °· Get help right away if you have symptoms that your health care provider told you to watch for. °This information is not intended to replace advice given to you by your health care provider. Make sure you discuss any questions you have with your health care provider. °Document Released: 03/04/2000 Document Revised: 02/21/2017 Document Reviewed: 10/12/2015 °Elsevier Interactive Patient Education © 2019 [employer] Inc. ° °

## 2018-04-02 ENCOUNTER — Inpatient Hospital Stay (HOSPITAL_COMMUNITY): Payer: BLUE CROSS/BLUE SHIELD

## 2018-05-01 LAB — HM PAP SMEAR

## 2018-05-08 DIAGNOSIS — Z124 Encounter for screening for malignant neoplasm of cervix: Secondary | ICD-10-CM | POA: Diagnosis not present

## 2018-05-08 DIAGNOSIS — Z3040 Encounter for surveillance of contraceptives, unspecified: Secondary | ICD-10-CM | POA: Diagnosis not present

## 2018-05-08 DIAGNOSIS — Z30430 Encounter for insertion of intrauterine contraceptive device: Secondary | ICD-10-CM | POA: Diagnosis not present

## 2018-05-15 DIAGNOSIS — Z30430 Encounter for insertion of intrauterine contraceptive device: Secondary | ICD-10-CM | POA: Diagnosis not present

## 2018-06-21 DIAGNOSIS — Z30432 Encounter for removal of intrauterine contraceptive device: Secondary | ICD-10-CM | POA: Diagnosis not present

## 2018-06-21 DIAGNOSIS — Z3040 Encounter for surveillance of contraceptives, unspecified: Secondary | ICD-10-CM | POA: Diagnosis not present

## 2018-07-16 DIAGNOSIS — Z3009 Encounter for other general counseling and advice on contraception: Secondary | ICD-10-CM | POA: Diagnosis not present

## 2018-07-16 DIAGNOSIS — R5383 Other fatigue: Secondary | ICD-10-CM | POA: Diagnosis not present

## 2018-07-16 DIAGNOSIS — Z6841 Body Mass Index (BMI) 40.0 and over, adult: Secondary | ICD-10-CM | POA: Diagnosis not present

## 2018-07-16 DIAGNOSIS — Z130 Encounter for screening for diseases of the blood and blood-forming organs and certain disorders involving the immune mechanism: Secondary | ICD-10-CM | POA: Diagnosis not present

## 2018-07-16 DIAGNOSIS — Z1329 Encounter for screening for other suspected endocrine disorder: Secondary | ICD-10-CM | POA: Diagnosis not present

## 2018-07-16 DIAGNOSIS — E669 Obesity, unspecified: Secondary | ICD-10-CM | POA: Diagnosis not present

## 2018-08-08 ENCOUNTER — Ambulatory Visit: Payer: BLUE CROSS/BLUE SHIELD | Admitting: Dietician

## 2018-12-10 DIAGNOSIS — E669 Obesity, unspecified: Secondary | ICD-10-CM | POA: Diagnosis not present

## 2018-12-10 DIAGNOSIS — F329 Major depressive disorder, single episode, unspecified: Secondary | ICD-10-CM | POA: Diagnosis not present

## 2018-12-10 DIAGNOSIS — Z113 Encounter for screening for infections with a predominantly sexual mode of transmission: Secondary | ICD-10-CM | POA: Diagnosis not present

## 2018-12-10 DIAGNOSIS — N939 Abnormal uterine and vaginal bleeding, unspecified: Secondary | ICD-10-CM | POA: Diagnosis not present

## 2018-12-10 DIAGNOSIS — F320 Major depressive disorder, single episode, mild: Secondary | ICD-10-CM | POA: Diagnosis not present

## 2018-12-10 DIAGNOSIS — N946 Dysmenorrhea, unspecified: Secondary | ICD-10-CM | POA: Diagnosis not present

## 2018-12-10 DIAGNOSIS — N926 Irregular menstruation, unspecified: Secondary | ICD-10-CM | POA: Diagnosis not present

## 2018-12-20 DIAGNOSIS — N946 Dysmenorrhea, unspecified: Secondary | ICD-10-CM | POA: Diagnosis not present

## 2018-12-20 DIAGNOSIS — N939 Abnormal uterine and vaginal bleeding, unspecified: Secondary | ICD-10-CM | POA: Diagnosis not present

## 2018-12-20 DIAGNOSIS — N926 Irregular menstruation, unspecified: Secondary | ICD-10-CM | POA: Diagnosis not present

## 2019-01-10 DIAGNOSIS — N840 Polyp of corpus uteri: Secondary | ICD-10-CM | POA: Diagnosis not present

## 2019-01-10 DIAGNOSIS — N926 Irregular menstruation, unspecified: Secondary | ICD-10-CM | POA: Diagnosis not present

## 2019-01-11 DIAGNOSIS — E669 Obesity, unspecified: Secondary | ICD-10-CM

## 2019-01-11 DIAGNOSIS — E282 Polycystic ovarian syndrome: Secondary | ICD-10-CM

## 2019-01-11 DIAGNOSIS — F32A Depression, unspecified: Secondary | ICD-10-CM | POA: Insufficient documentation

## 2019-01-11 DIAGNOSIS — F329 Major depressive disorder, single episode, unspecified: Secondary | ICD-10-CM | POA: Insufficient documentation

## 2019-01-11 NOTE — H&P (Signed)
Monique Bryan is an 28 y.o. female. Presenting for scheduled surgery. Was seen in office for heavy, persistent AUB. PCOS diagnosed and polyp seen on SIS. EMB was benign but showed polyps. She continued to bleed through high dose megace. She is not currently SA.   Pertinent Gynecological History: Menses: flow is excessive with use of 14 pads or tampons on heaviest days Bleeding: dysfunctional uterine bleeding Contraception: abstinence DES exposure: denies Blood transfusions: none Sexually transmitted diseases: no past history Previous GYN Procedures: n/a  Last mammogram: n/a  Last pap: normal  OB History: G1, P1001 VAVD "Monique Bryan" c/b PIH  Menstrual History: No LMP recorded.    Past Medical History:  Diagnosis Date  . Medical history non-contributory   . Pregnancy induced hypertension   PCOS Morbid obesity Depression  Past Surgical History:  Procedure Laterality Date  . TONSILLECTOMY      Family History  Problem Relation Age of Onset  . Sickle cell trait Mother     Social History:  reports that she has never smoked. She has never used smokeless tobacco. She reports previous drug use. Drug: Marijuana. She reports that she does not drink alcohol.  Allergies: No Known Allergies  No medications prior to admission.    ROS  unknown if currently breastfeeding. Physical Exam  Gen: well appearing, NAD CV: Reg rate Pulm: NWOB Abd: soft, nondistended, nontender, no masses, morbidly obese GYN: uterus 7 week size, no adnexa ttp/CMT Ext: No edema b/l   No results found for this or any previous visit (from the past 24 hour(s)).  No results found.   TVUS 12/2018:  2.5cm polyp 8x4x5cm uterus PCOS ovaries   Assessment/Plan: 28 yo w/AUB non responsive to megace found to have large polyp. EMB with polyps. Presenting for scheduled [facility] D&C polypectomy and mIUD insertion.  Risks were reviewed including infection, bleeding, damage to surrounding, need for additional  procedures, fluid overload, recurrent polyps, the possibility of cancer/unexpected abnormality on final pathology, and thromboembolic disease. All contraceptive options were discussed and patient opts mirena IUD. No absolute contraindications. Discussed risk of uterine perforation, ectopic pregnancy, failure, infection, and ovarian cyst formation. Patient desires IUD to be placed during surgery. However, understands that the hospital does not provide IUDs. We will have to wait until the IUD arrives from her insurance company. If it does not arrive in time, she prefers it to be placed at her POV rather than delay surgery.  Pelvic rest was discussed. Typical postoperative course reviewed.    Tyson Dense 01/11/2019, 1:55 PM

## 2019-01-16 DIAGNOSIS — Z30430 Encounter for insertion of intrauterine contraceptive device: Secondary | ICD-10-CM | POA: Diagnosis not present

## 2019-01-17 ENCOUNTER — Other Ambulatory Visit (HOSPITAL_COMMUNITY)
Admission: RE | Admit: 2019-01-17 | Discharge: 2019-01-17 | Disposition: A | Discharge status: Home / Self Care | Payer: BC Managed Care – PPO | Source: Ambulatory Visit | Attending: Obstetrics and Gynecology | Admitting: Obstetrics and Gynecology

## 2019-01-17 ENCOUNTER — Other Ambulatory Visit: Payer: Self-pay

## 2019-01-17 ENCOUNTER — Encounter (HOSPITAL_COMMUNITY)
Admission: RE | Admit: 2019-01-17 | Discharge: 2019-01-17 | Disposition: A | Discharge status: Home / Self Care | Payer: BC Managed Care – PPO | Source: Ambulatory Visit | Attending: Obstetrics and Gynecology | Admitting: Obstetrics and Gynecology

## 2019-01-17 ENCOUNTER — Encounter (HOSPITAL_COMMUNITY): Payer: Self-pay

## 2019-01-17 DIAGNOSIS — Z01812 Encounter for preprocedural laboratory examination: Secondary | ICD-10-CM | POA: Diagnosis not present

## 2019-01-17 LAB — CBC
HCT: 40.9 % (ref 36.0–46.0)
Hemoglobin: 13.3 g/dL (ref 12.0–15.0)
MCH: 28.5 pg (ref 26.0–34.0)
MCHC: 32.5 g/dL (ref 30.0–36.0)
MCV: 87.8 fL (ref 80.0–100.0)
Platelets: 318 10*3/uL (ref 150–400)
RBC: 4.66 MIL/uL (ref 3.87–5.11)
RDW: 13.3 % (ref 11.5–15.5)
WBC: 9.7 10*3/uL (ref 4.0–10.5)
nRBC: 0 % (ref 0.0–0.2)

## 2019-01-17 LAB — TYPE AND SCREEN
ABO/RH(D): O POS
Antibody Screen: NEGATIVE

## 2019-01-17 LAB — ABO/RH: ABO/RH(D): O POS

## 2019-01-17 LAB — SARS CORONAVIRUS 2 (TAT 6-24 HRS): SARS Coronavirus 2: NEGATIVE

## 2019-01-17 NOTE — Pre-Procedure Instructions (Addendum)
Monique Bryan  01/17/2019     Your procedure is scheduled on Monday, November 2.   Report to [facility], Main Entrance or Entrance "A" at 5:30 AM                     Your surgery or procedure is scheduled for  7:15  A.M.   Call this number if you have problems the morning of surgery: [phone]  This is the number for the Pre- Surgical Desk.                  For any questions Friday, October 30, call [phone] between 8:00 AM and 4:00 PM and ask to speck to a nurse (this is the Pre- Admission Testing office)   Remember:  Do not eat after midnight Sunday, November 1.   You may drink clear liquids until 4:15 AM .   Clear liquids allowed are:   Water, Juice (non-citric and without pulp), Carbonated beverages, Clear Tea, Black Coffee only, Plain Jell-O only, Gatorade and Plain Popsicles only    Take these medicines the morning of surgery with A SIP OF WATER : buPROPion (WELLBUTRIN XL) megestrol (MEGACE)    STOP/DO Not start taking Aspirin, Aspirin Products (Goody Powder, Excedrin Migraine), Ibuprofen (Advil), Naproxen (Aleve), Vitamins and Herbal Products (ie Fish Oil).    Do not wear jewelry, make-up or nail polish.  Do not wear lotions, powders, or perfumes, or deodorant.  Do not shave 48 hours prior to surgery.  Men may shave face and neck.  Do not bring valuables to the hospital.  [facility] is not responsible for any belongings or valuables.  Contacts, dentures or bridgework may not be worn into surgery.  Leave your suitcase in the car.  After surgery it may be brought to your room.  For patients admitted to the hospital, discharge time will be determined by your treatment team.  Patients discharged the day of surgery will not be allowed to drive home.   Special instructions:   - Preparing For Surgery  Before surgery, you can play an important role. Because skin is not sterile, your skin needs to be as free of germs as possible. You can  reduce the number of germs on your skin by washing with CHG (chlorahexidine gluconate) Soap before surgery.  CHG is an antiseptic cleaner which kills germs and bonds with the skin to continue killing germs even after washing.    Oral Hygiene is also important to reduce your risk of infection.  Remember - BRUSH YOUR TEETH THE MORNING OF SURGERY WITH YOUR REGULAR TOOTHPASTE  Please do not use if you have an allergy to CHG or antibacterial soaps. If your skin becomes reddened/irritated stop using the CHG.  Do not shave (including legs and underarms) for at least 48 hours prior to first CHG shower. It is OK to shave your face.  Please follow these instructions carefully.   1. Shower the NIGHT BEFORE SURGERY and the MORNING OF SURGERY with CHG.   2. If you chose to wash your hair, wash your hair first as usual with your normal shampoo.  3. After you shampoo,wash your face and private area with the soap you use at home, then rinse your hair and body thoroughly to remove the shampoo and soap.   4. Use CHG as you would any other liquid soap. You can apply CHG directly to the skin and wash gently with a scrungie or a clean  washcloth.   5. Apply the CHG Soap to your body ONLY FROM THE NECK DOWN.  Do not use on open wounds or open sores. Avoid contact with your eyes, ears, mouth and genitals (private parts).   6. Wash thoroughly, paying special attention to the area where your surgery will be performed.  7. Thoroughly rinse your body with warm water from the neck down.  8. DO NOT shower/wash with your normal soap after using and rinsing off the CHG Soap.  9. Pat yourself dry with a CLEAN TOWEL.  10. Wear CLEAN PAJAMAS to bed the night before surgery, wear comfortable clothes the morning of surgery  11. Place CLEAN SHEETS on your bed the night of your first shower and DO NOT SLEEP WITH PETS.  Day of Surgery: Shower as instructed above. Do not apply any deodorants/lotions.  Please wear clean  clothes to the hospital/surgery center.   Remember to brush your teeth WITH YOUR REGULAR TOOTHPASTE.  Please read over the following fact sheets that you were given: Coughing and Deep Breathing, Pain Booklet  and Surgical Site Infections

## 2019-01-17 NOTE — Progress Notes (Addendum)
PCP - [facility] (no specific MD) Cardiologist - deneis  Chest x-ray - N/A EKG - N/A Stress Test - denies  ECHO - denies Cardiac Cath - denies  Blood Thinner Instructions: N/A Aspirin Instructions: N/A  ERAS Protocol - clear liquids until 0415 DOS  COVID TEST- scheduled after PAT visit today, 01/17/19; told to go to ED tunnel d/t GVC closed secondary to weather  Coronavirus Screening  Have you experienced the following symptoms:  Cough yes/no: No Fever (>100.25F)  yes/no: No Runny nose yes/no: No Sore throat yes/no: No Difficulty breathing/shortness of breath  yes/no: No  Have you or a family member traveled in the last 14 days and where? yes/no: No  If the patient indicates "YES" to the above questions, their PAT will be rescheduled to limit the exposure to others and, the surgeon will be notified. THE PATIENT WILL NEED TO BE ASYMPTOMATIC FOR 14 DAYS.   If the patient is not experiencing any of these symptoms, the PAT nurse will instruct them to NOT bring anyone with them to their appointment since they may have these symptoms or traveled as well.   Patient requests a letter for work stating her need to self-quarantine between COVID test and procedure.  Directed to contact her physician for work release.  Please remind your patients and families that hospital visitation restrictions are in effect and the importance of the restrictions.   Anesthesia review: No  Patient denies shortness of breath, fever, cough and chest pain at PAT appointment  All instructions explained to the patient, with a verbal understanding of the material. Patient agrees to go over the instructions while at home for a better understanding. Patient also instructed to self quarantine after being tested for COVID-19. The opportunity to ask questions was provided.

## 2019-01-20 NOTE — Anesthesia Preprocedure Evaluation (Addendum)
Anesthesia Evaluation  Patient identified by MRN, date of birth, ID band Patient awake    Reviewed: Allergy & Precautions, NPO status , Patient's Chart, lab work & pertinent test results  Airway Mallampati: II  TM Distance: >3 FB Neck ROM: Full    Dental no notable dental hx.    Pulmonary neg pulmonary ROS,    Pulmonary exam normal breath sounds clear to auscultation       Cardiovascular negative cardio ROS Normal cardiovascular exam Rhythm:Regular Rate:Normal     Neuro/Psych PSYCHIATRIC DISORDERS Depression negative neurological ROS     GI/Hepatic negative GI ROS, Neg liver ROS,   Endo/Other  Morbid obesity (SUPER)PCOS   Renal/GU negative Renal ROS     Musculoskeletal negative musculoskeletal ROS (+)   Abdominal (+) + obese,   Peds  Hematology negative hematology ROS (+)   Anesthesia Other Findings polyps  Reproductive/Obstetrics                            Anesthesia Physical Anesthesia Plan  ASA: IV  Anesthesia Plan: General   Post-op Pain Management:    Induction: Intravenous and Rapid sequence  PONV Risk Score and Plan: 4 or greater and Midazolam, Dexamethasone, Ondansetron and Treatment may vary due to age or medical condition  Airway Management Planned: Oral ETT  Additional Equipment:   Intra-op Plan:   Post-operative Plan: Extubation in OR  Informed Consent: I have reviewed the patients History and Physical, chart, labs and discussed the procedure including the risks, benefits and alternatives for the proposed anesthesia with the patient or authorized representative who has indicated his/her understanding and acceptance.     Dental advisory given  Plan Discussed with: CRNA  Anesthesia Plan Comments:        Anesthesia Quick Evaluation

## 2019-01-21 ENCOUNTER — Encounter (HOSPITAL_COMMUNITY)
Admission: RE | Disposition: A | Discharge status: Home / Self Care | Payer: Self-pay | Source: Home / Self Care | Attending: Obstetrics and Gynecology

## 2019-01-21 ENCOUNTER — Other Ambulatory Visit: Payer: Self-pay

## 2019-01-21 ENCOUNTER — Ambulatory Visit (HOSPITAL_COMMUNITY): Payer: BC Managed Care – PPO | Admitting: Anesthesiology

## 2019-01-21 ENCOUNTER — Encounter (HOSPITAL_COMMUNITY): Payer: Self-pay | Admitting: Certified Registered Nurse Anesthetist

## 2019-01-21 ENCOUNTER — Ambulatory Visit (HOSPITAL_COMMUNITY)
Admission: RE | Admit: 2019-01-21 | Discharge: 2019-01-21 | Disposition: A | Discharge status: Home / Self Care | Payer: BC Managed Care – PPO | Attending: Obstetrics and Gynecology | Admitting: Obstetrics and Gynecology

## 2019-01-21 DIAGNOSIS — N840 Polyp of corpus uteri: Secondary | ICD-10-CM | POA: Insufficient documentation

## 2019-01-21 DIAGNOSIS — N939 Abnormal uterine and vaginal bleeding, unspecified: Secondary | ICD-10-CM | POA: Insufficient documentation

## 2019-01-21 DIAGNOSIS — R9389 Abnormal findings on diagnostic imaging of other specified body structures: Secondary | ICD-10-CM | POA: Diagnosis not present

## 2019-01-21 DIAGNOSIS — Z6841 Body Mass Index (BMI) 40.0 and over, adult: Secondary | ICD-10-CM | POA: Diagnosis not present

## 2019-01-21 DIAGNOSIS — F329 Major depressive disorder, single episode, unspecified: Secondary | ICD-10-CM | POA: Diagnosis not present

## 2019-01-21 DIAGNOSIS — E669 Obesity, unspecified: Secondary | ICD-10-CM

## 2019-01-21 DIAGNOSIS — E282 Polycystic ovarian syndrome: Secondary | ICD-10-CM | POA: Diagnosis not present

## 2019-01-21 DIAGNOSIS — Z30430 Encounter for insertion of intrauterine contraceptive device: Secondary | ICD-10-CM | POA: Diagnosis not present

## 2019-01-21 HISTORY — PX: DILATATION & CURETTAGE/HYSTEROSCOPY WITH MYOSURE: SHX6511

## 2019-01-21 HISTORY — PX: INTRAUTERINE DEVICE (IUD) INSERTION: SHX5877

## 2019-01-21 LAB — POCT PREGNANCY, URINE: Preg Test, Ur: NEGATIVE

## 2019-01-21 SURGERY — DILATATION & CURETTAGE/HYSTEROSCOPY WITH MYOSURE
Anesthesia: General | Site: Vagina

## 2019-01-21 MED ORDER — LACTATED RINGERS IV SOLN
INTRAVENOUS | Status: DC | PRN
  Administered 2019-01-21 (×2): via INTRAVENOUS

## 2019-01-21 MED ORDER — SODIUM CHLORIDE 0.9 % IR SOLN
Status: DC | PRN
  Administered 2019-01-21: 3000 mL

## 2019-01-21 MED ORDER — KETOROLAC TROMETHAMINE 30 MG/ML IJ SOLN
30.0000 mg | Freq: Once | INTRAMUSCULAR | Status: AC | PRN
  Administered 2019-01-21: 30 mg via INTRAVENOUS

## 2019-01-21 MED ORDER — OXYCODONE HCL 5 MG/5ML PO SOLN: 5.0000 mg | Freq: Once | ORAL | Status: AC | PRN

## 2019-01-21 MED ORDER — SILVER NITRATE-POT NITRATE 75-25 % EX MISC
CUTANEOUS | Status: AC
  Filled 2019-01-21: qty 1

## 2019-01-21 MED ORDER — HYDROMORPHONE HCL 1 MG/ML IJ SOLN
0.2500 mg | INTRAMUSCULAR | Status: DC | PRN
  Administered 2019-01-21 (×2): 0.5 mg via INTRAVENOUS

## 2019-01-21 MED ORDER — FAMOTIDINE 20 MG PO TABS
20.0000 mg | ORAL_TABLET | Freq: Once | ORAL | Status: AC
  Administered 2019-01-21: 07:00:00 20 mg via ORAL

## 2019-01-21 MED ORDER — ONDANSETRON HCL 4 MG/2ML IJ SOLN
INTRAMUSCULAR | Status: AC
  Filled 2019-01-21: qty 2

## 2019-01-21 MED ORDER — KETOROLAC TROMETHAMINE 30 MG/ML IJ SOLN
INTRAMUSCULAR | Status: AC
  Filled 2019-01-21: qty 1

## 2019-01-21 MED ORDER — PHENYLEPHRINE 40 MCG/ML (10ML) SYRINGE FOR IV PUSH (FOR BLOOD PRESSURE SUPPORT)
PREFILLED_SYRINGE | INTRAVENOUS | Status: AC
  Filled 2019-01-21: qty 10

## 2019-01-21 MED ORDER — PROPOFOL 10 MG/ML IV BOLUS
INTRAVENOUS | Status: DC | PRN
  Administered 2019-01-21: 200 mg via INTRAVENOUS
  Administered 2019-01-21: 100 mg via INTRAVENOUS

## 2019-01-21 MED ORDER — FENTANYL CITRATE (PF) 250 MCG/5ML IJ SOLN
INTRAMUSCULAR | Status: AC
  Filled 2019-01-21: qty 5

## 2019-01-21 MED ORDER — LIDOCAINE HCL (PF) 1 % IJ SOLN
INTRAMUSCULAR | Status: AC
  Filled 2019-01-21: qty 30

## 2019-01-21 MED ORDER — PROPOFOL 10 MG/ML IV BOLUS
INTRAVENOUS | Status: AC
  Filled 2019-01-21: qty 20

## 2019-01-21 MED ORDER — DEXAMETHASONE SODIUM PHOSPHATE 10 MG/ML IJ SOLN
INTRAMUSCULAR | Status: AC
  Filled 2019-01-21: qty 1

## 2019-01-21 MED ORDER — ROCURONIUM BROMIDE 10 MG/ML (PF) SYRINGE
PREFILLED_SYRINGE | INTRAVENOUS | Status: AC
  Filled 2019-01-21: qty 10

## 2019-01-21 MED ORDER — ONDANSETRON HCL 4 MG/2ML IJ SOLN
INTRAMUSCULAR | Status: DC | PRN
  Administered 2019-01-21: 4 mg via INTRAVENOUS

## 2019-01-21 MED ORDER — FAMOTIDINE 20 MG PO TABS
ORAL_TABLET | ORAL | Status: AC
  Administered 2019-01-21: 20 mg via ORAL
  Filled 2019-01-21: qty 1

## 2019-01-21 MED ORDER — OXYCODONE HCL 5 MG PO TABS
5.0000 mg | ORAL_TABLET | Freq: Once | ORAL | Status: AC | PRN
  Administered 2019-01-21: 5 mg via ORAL

## 2019-01-21 MED ORDER — SUCCINYLCHOLINE CHLORIDE 200 MG/10ML IV SOSY
PREFILLED_SYRINGE | INTRAVENOUS | Status: DC | PRN
  Administered 2019-01-21: 140 mg via INTRAVENOUS

## 2019-01-21 MED ORDER — ACETAMINOPHEN 500 MG PO TABS
ORAL_TABLET | ORAL | Status: AC
  Administered 2019-01-21: 1000 mg via ORAL
  Filled 2019-01-21: qty 1

## 2019-01-21 MED ORDER — FENTANYL CITRATE (PF) 250 MCG/5ML IJ SOLN
INTRAMUSCULAR | Status: DC | PRN
  Administered 2019-01-21 (×2): 50 ug via INTRAVENOUS

## 2019-01-21 MED ORDER — SUCCINYLCHOLINE CHLORIDE 200 MG/10ML IV SOSY
PREFILLED_SYRINGE | INTRAVENOUS | Status: AC
  Filled 2019-01-21: qty 10

## 2019-01-21 MED ORDER — ACETAMINOPHEN 500 MG PO TABS
1000.0000 mg | ORAL_TABLET | Freq: Once | ORAL | Status: AC
  Administered 2019-01-21: 07:00:00 1000 mg via ORAL

## 2019-01-21 MED ORDER — PHENYLEPHRINE 40 MCG/ML (10ML) SYRINGE FOR IV PUSH (FOR BLOOD PRESSURE SUPPORT)
PREFILLED_SYRINGE | INTRAVENOUS | Status: DC | PRN
  Administered 2019-01-21 (×3): 80 ug via INTRAVENOUS
  Administered 2019-01-21: 40 ug via INTRAVENOUS
  Administered 2019-01-21: 80 ug via INTRAVENOUS

## 2019-01-21 MED ORDER — HYDROMORPHONE HCL 1 MG/ML IJ SOLN
INTRAMUSCULAR | Status: AC
  Filled 2019-01-21: qty 1

## 2019-01-21 MED ORDER — OXYCODONE HCL 5 MG PO TABS
ORAL_TABLET | ORAL | Status: AC
  Filled 2019-01-21: qty 1

## 2019-01-21 MED ORDER — PROMETHAZINE HCL 25 MG/ML IJ SOLN: 6.2500 mg | INTRAMUSCULAR | Status: DC | PRN

## 2019-01-21 MED ORDER — LIDOCAINE 2% (20 MG/ML) 5 ML SYRINGE
INTRAMUSCULAR | Status: AC
  Filled 2019-01-21: qty 5

## 2019-01-21 MED ORDER — MIDAZOLAM HCL 5 MG/5ML IJ SOLN
INTRAMUSCULAR | Status: DC | PRN
  Administered 2019-01-21: 2 mg via INTRAVENOUS

## 2019-01-21 MED ORDER — LIDOCAINE HCL 1 % IJ SOLN
INTRAMUSCULAR | Status: DC | PRN
  Administered 2019-01-21: 10 mL

## 2019-01-21 MED ORDER — DEXAMETHASONE SODIUM PHOSPHATE 10 MG/ML IJ SOLN
INTRAMUSCULAR | Status: DC | PRN
  Administered 2019-01-21: 10 mg via INTRAVENOUS

## 2019-01-21 MED ORDER — MIDAZOLAM HCL 2 MG/2ML IJ SOLN
INTRAMUSCULAR | Status: AC
  Filled 2019-01-21: qty 2

## 2019-01-21 MED ORDER — LIDOCAINE 2% (20 MG/ML) 5 ML SYRINGE
INTRAMUSCULAR | Status: DC | PRN
  Administered 2019-01-21: 60 mg via INTRAVENOUS
  Administered 2019-01-21: 40 mg via INTRAVENOUS

## 2019-01-21 SURGICAL SUPPLY — 23 items
CANISTER SUCTION 2500CC (MISCELLANEOUS) ×3 IMPLANT
CATH ROBINSON RED A/P 16FR (CATHETERS) ×3 IMPLANT
DEVICE MYOSURE LITE (MISCELLANEOUS) IMPLANT
DEVICE MYOSURE REACH (MISCELLANEOUS) ×1 IMPLANT
DILATOR CANAL MILEX (MISCELLANEOUS) IMPLANT
GLOVE BIO SURGEON STRL SZ 6.5 (GLOVE) ×3 IMPLANT
GLOVE BIOGEL PI IND STRL 6.5 (GLOVE) ×2 IMPLANT
GLOVE BIOGEL PI IND STRL 7.0 (GLOVE) ×2 IMPLANT
GLOVE BIOGEL PI INDICATOR 6.5 (GLOVE) ×2
GLOVE BIOGEL PI INDICATOR 7.0 (GLOVE) ×1
GLOVE NEODERM STER SZ 7 (GLOVE) ×1 IMPLANT
GLOVE SURG SS PI 7.0 STRL IVOR (GLOVE) ×2 IMPLANT
GOWN SPEC L3 MED W/TWL (GOWN DISPOSABLE) ×6 IMPLANT
KIT PROCEDURE FLUENT (KITS) ×3 IMPLANT
KIT TURNOVER KIT B (KITS) ×3 IMPLANT
MYOSURE XL FIBROID REM (MISCELLANEOUS)
PACK VAGINAL MINOR WOMEN LF (CUSTOM PROCEDURE TRAY) ×3 IMPLANT
PAD OB MATERNITY 4.3X12.25 (PERSONAL CARE ITEMS) ×3 IMPLANT
SEAL ROD LENS SCOPE MYOSURE (ABLATOR) ×3 IMPLANT
SYSTEM TISS REMOVAL MYSR XL RM (MISCELLANEOUS) IMPLANT
TOWEL GREEN STERILE FF (TOWEL DISPOSABLE) ×6 IMPLANT
UNDERPAD 30X30 (UNDERPADS AND DIAPERS) ×3 IMPLANT
mirena ×1 IMPLANT

## 2019-01-21 NOTE — Anesthesia Procedure Notes (Signed)
Procedure Name: Intubation Date/Time: 01/21/2019 7:20 AM Performed by: Harden Mo, CRNA Pre-anesthesia Checklist: Patient identified, Emergency Drugs available, Suction available and Patient being monitored Patient Re-evaluated:Patient Re-evaluated prior to induction Oxygen Delivery Method: Circle System Utilized Preoxygenation: Pre-oxygenation with 100% oxygen Induction Type: IV induction, Rapid sequence and Cricoid Pressure applied Laryngoscope Size: Miller and 2 Grade View: Grade I Tube type: Oral Tube size: 7.0 mm Number of attempts: 1 Airway Equipment and Method: Stylet and Oral airway Placement Confirmation: ETT inserted through vocal cords under direct vision,  positive ETCO2 and breath sounds checked- equal and bilateral Secured at: 21 cm Tube secured with: Tape Dental Injury: Teeth and Oropharynx as per pre-operative assessment

## 2019-01-21 NOTE — Transfer of Care (Signed)
Immediate Anesthesia Transfer of Care Note  Patient: Mardee Postin  Procedure(s) Performed: DILATATION & CURETTAGE/HYSTEROSCOPY WITH MYOSURE (N/A Vagina ) INTRAUTERINE DEVICE (IUD) INSERTION (N/A Uterus)  Patient Location: PACU  Anesthesia Type:General  Level of Consciousness: awake, alert  and oriented  Airway & Oxygen Therapy: Patient Spontanous Breathing  Post-op Assessment: Report given to RN, Post -op Vital signs reviewed and stable and Patient moving all extremities X 4  Post vital signs: Reviewed and stable  Last Vitals:  Vitals Value Taken Time  BP    Temp    Pulse 85 01/21/19 0802  Resp 21 01/21/19 0802  SpO2 95 % 01/21/19 0802  Vitals shown include unvalidated device data.  Last Pain:  Vitals:   01/21/19 0653  TempSrc: Oral  PainSc: 0-No pain      Patients Stated Pain Goal: 3 (58/52/77 8242)  Complications: No apparent anesthesia complications

## 2019-01-21 NOTE — Progress Notes (Signed)
No updates to above H&P. Patient arrived NPO and was consented in PACU. Risks again discussed, all questions answered, and consent signed. Proceed with above surgery.    Leif Loflin MD  

## 2019-01-21 NOTE — Anesthesia Postprocedure Evaluation (Signed)
Anesthesia Post Note  Patient: Monique Bryan  Procedure(s) Performed: DILATATION & CURETTAGE/HYSTEROSCOPY WITH MYOSURE (N/A Vagina ) INTRAUTERINE DEVICE (IUD) INSERTION (N/A Uterus)     Patient location during evaluation: PACU Anesthesia Type: General Level of consciousness: awake and alert Pain management: pain level controlled Vital Signs Assessment: post-procedure vital signs reviewed and stable Respiratory status: spontaneous breathing, nonlabored ventilation, respiratory function stable and patient connected to nasal cannula oxygen Cardiovascular status: blood pressure returned to baseline and stable Postop Assessment: no apparent nausea or vomiting Anesthetic complications: no    Last Vitals:  Vitals:   01/21/19 0903 01/21/19 0935  BP: 109/63 (!) 142/87  Pulse: 68 70  Resp: 17   Temp: 36.4 C   SpO2: 97% 95%    Last Pain:  Vitals:   01/21/19 0903  TempSrc:   PainSc: 3                  Baila Rouse P Karyna Bessler

## 2019-01-21 NOTE — Op Note (Signed)
PREOPERATIVE DIAGNOSES: 1. Abnormal uterine bleeding 2. polyps  POSTOPERATIVE DIAGNOSES: Same  PROCEDURE PERFORMED: Dilation, curretage, polypectomy, hysteroscopy, mirena IUD insertion  SURGEON: Dr. Lucillie Garfinkel  ANESTHESIA: General  ESTIMATED BLOOD LOSS: 50cc.  Fluid deficit: 130QM  COMPLICATIONS: None  TUBES: None.  DRAINS: None  PATHOLOGY: Endometrial curretings and polyp  FINDINGS: On exam, under anesthesia, normal appearing vulva and vagina, 8  week sized uterus  Operative findings demonstrated two polyps and a plethora of fluffy endometrium. B/l ostia visualized  Procedure: The patient was taken to the operating room where she was properly prepped and draped in sterile manner under general anesthesia. After bimanual examination, the cervix was exposed with a weighted vaginal speculum and the anterior lip of the cervix grasped with a tenaculum.  The endocervical canal was then progressively dilated to 6mm. The hysteroscope was then introduced into the uterine cavity using sterile saline solution as a distending media and with attached video camera. The endometrial cavity was distended with fluids and the cavity with the b/l ostia were visualized. A plethora of proliferative appearing endometrium was noted and the above operative findings. Myosure was used to resect the polyps. Sharp curretage was then performed. The scope was reintroduced and a picture wasaken of the endometrial cavity and the hysteroscope removed from the cavity. The mirena IUD was inserted per package instructions, sounding uterus to 8cm. Strings were cut to 1.5cm. Everything was removed from the vagina. The sponge and lap counts were correct times 2 at this time. The patient's procedure was terminated. We then awakened her. She was sent to the Recovery Room in good condition.    Lucillie Garfinkel MD

## 2019-01-22 ENCOUNTER — Encounter (HOSPITAL_COMMUNITY): Payer: Self-pay | Admitting: Obstetrics and Gynecology

## 2019-01-22 LAB — SURGICAL PATHOLOGY

## 2019-02-02 DIAGNOSIS — J029 Acute pharyngitis, unspecified: Secondary | ICD-10-CM | POA: Diagnosis not present

## 2019-02-03 DIAGNOSIS — J029 Acute pharyngitis, unspecified: Secondary | ICD-10-CM | POA: Diagnosis not present

## 2019-02-03 DIAGNOSIS — R5381 Other malaise: Secondary | ICD-10-CM | POA: Diagnosis not present

## 2019-02-04 DIAGNOSIS — J029 Acute pharyngitis, unspecified: Secondary | ICD-10-CM | POA: Diagnosis not present

## 2019-03-04 DIAGNOSIS — Z3202 Encounter for pregnancy test, result negative: Secondary | ICD-10-CM | POA: Diagnosis not present

## 2019-06-14 ENCOUNTER — Other Ambulatory Visit (HOSPITAL_COMMUNITY): Payer: Self-pay | Admitting: Surgery

## 2019-06-14 ENCOUNTER — Other Ambulatory Visit: Payer: Self-pay | Admitting: Surgery

## 2019-07-04 ENCOUNTER — Other Ambulatory Visit (HOSPITAL_COMMUNITY): Payer: BC Managed Care – PPO

## 2019-07-04 ENCOUNTER — Ambulatory Visit (HOSPITAL_COMMUNITY): Payer: BC Managed Care – PPO

## 2019-07-05 ENCOUNTER — Encounter (HOSPITAL_COMMUNITY): Payer: Self-pay

## 2019-07-05 ENCOUNTER — Ambulatory Visit (HOSPITAL_COMMUNITY): Payer: BC Managed Care – PPO | Attending: Surgery

## 2019-07-05 ENCOUNTER — Inpatient Hospital Stay (HOSPITAL_COMMUNITY): Admission: RE | Admit: 2019-07-05 | Payer: BC Managed Care – PPO | Source: Ambulatory Visit

## 2019-07-16 ENCOUNTER — Encounter: Payer: BC Managed Care – PPO | Attending: Surgery | Admitting: Dietician

## 2019-07-16 ENCOUNTER — Encounter: Payer: Self-pay | Admitting: Dietician

## 2019-07-16 ENCOUNTER — Other Ambulatory Visit: Payer: Self-pay

## 2019-07-16 DIAGNOSIS — E669 Obesity, unspecified: Secondary | ICD-10-CM | POA: Insufficient documentation

## 2019-07-16 NOTE — Progress Notes (Signed)
Nutrition Assessment for Bariatric Surgery Medical Nutrition Therapy   Patient was seen on 07/16/2019 for Pre-Operative Nutrition Assessment. Letter of approval faxed to [facility] bariatric surgery program coordinator on 07/16/2019.   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 Lives with her baby daughter. Typical meal pattern is 2-3 meals per day plus maybe a snack. May miss breakfast or lunch. States she does not cook much, however does much grocery shopping for her patients at work. Drinks lots of juice and soda and dislikes water, but trying to cut back on soda. Likes sugar and sweets, may eat [place] food, [employer], pizza, subs, bacon egg and cheese biscuit, honey bun, or oatmeal pie. Avoids pork and fish, but will eat shrimp, chicken, and beef.   24-Hr Dietary Recall First Meal: apple juice  Snack: - Second Meal: grilled chicken sandwich  Snack: Doritos  Third Meal: 2 slices pizza, 1/2 sub Snack: - Beverages: apple juice, soda (Cherry Coke, Mtn Dew, Cheerwine), water    NUTRITION DIAGNOSIS  Overweight/obesity ([place]-3.3) related to past poor dietary habits and physical inactivity as evidenced by patient w/ planned bariatric surgery following dietary guidelines for continued weight loss.    NUTRITION INTERVENTION  Nutrition counseling (C-1) and education (E-2) to facilitate bariatric surgery goals.  Pre-Op Goals Reviewed with the Patient . Track food and beverage intake (pen and paper, MyFitness Pal, Baritastic app, etc.) . Make healthy food choices while monitoring portion sizes . Consume 3  meals per day or try to eat every 3-5 hours . Avoid concentrated sugars and fried foods . Keep sugar & fat in the single digits per serving on food labels . Practice CHEWING your food (aim for applesauce consistency) . Practice not drinking 15 minutes before, during, and 30 minutes after each meal and snack . Avoid all carbonated beverages (ex: soda, sparkling beverages)  . Limit caffeinated beverages (ex: coffee, tea, energy drinks) . Avoid all sugar-sweetened beverages (ex: regular soda, sports drinks)  . Avoid alcohol  . Aim for 64-100 ounces of FLUID daily (with at least half of fluid intake being plain water)  . Aim for at least 60-80 grams of PROTEIN daily . Look for a liquid protein source that contains ?15 g protein and ?5 g carbohydrate (ex: shakes, drinks, shots) . Make a list of non-food related activities . Physical activity is an important part of a healthy lifestyle so keep it moving! The goal is to reach 150 minutes of exercise per week, including cardiovascular and weight baring activity.  *Goals that are bolded indicate the pt would like to start working towards these  Handouts Provided Include  . Bariatric Surgery handouts (Nutrition Visits, Pre-Op Goals, Protein Shakes, Vitamins & Minerals)  Learning Style & Readiness for Change Teaching method utilized: Visual & Auditory  Demonstrated degree of understanding via: Teach Back  Barriers to learning/adherence to lifestyle change: None Identified    MONITORING & EVALUATION Dietary intake, weekly physical activity, body weight, and pre-op goals reached at next nutrition visit.   Next Steps Patient is to follow up at [facility] for Pre-Op Class (>2 weeks before surgery)  for further nutrition education.

## 2019-07-16 NOTE — Patient Instructions (Addendum)
Begin working through the [employer] discussed today, starting with the following:   Consume 3 meals per day or try to eat every 3-5 hours  Avoid all carbonated beverages (ex: soda, sparkling beverages)   Avoid all sugar-sweetened beverages (ex: regular soda, sports drinks)

## 2019-10-24 ENCOUNTER — Other Ambulatory Visit (HOSPITAL_COMMUNITY): Payer: Self-pay | Admitting: Surgery

## 2019-11-05 ENCOUNTER — Ambulatory Visit (HOSPITAL_COMMUNITY)
Admission: RE | Admit: 2019-11-05 | Discharge: 2019-11-05 | Disposition: A | Discharge status: Home / Self Care | Payer: BC Managed Care – PPO | Source: Ambulatory Visit | Attending: Surgery | Admitting: Surgery

## 2019-11-05 ENCOUNTER — Other Ambulatory Visit (HOSPITAL_COMMUNITY): Payer: Self-pay | Admitting: Surgery

## 2019-11-05 ENCOUNTER — Other Ambulatory Visit: Payer: Self-pay

## 2019-11-05 DIAGNOSIS — Z01818 Encounter for other preprocedural examination: Secondary | ICD-10-CM | POA: Diagnosis not present

## 2019-11-05 DIAGNOSIS — K219 Gastro-esophageal reflux disease without esophagitis: Secondary | ICD-10-CM | POA: Diagnosis not present

## 2020-01-23 ENCOUNTER — Emergency Department (HOSPITAL_COMMUNITY)
Admission: EM | Admit: 2020-01-23 | Discharge: 2020-01-23 | Disposition: A | Discharge status: Home / Self Care | Payer: BC Managed Care – PPO | Attending: Emergency Medicine | Admitting: Emergency Medicine

## 2020-01-23 ENCOUNTER — Other Ambulatory Visit: Payer: Self-pay

## 2020-01-23 ENCOUNTER — Emergency Department (HOSPITAL_COMMUNITY): Payer: BC Managed Care – PPO

## 2020-01-23 ENCOUNTER — Encounter (HOSPITAL_COMMUNITY): Payer: Self-pay | Admitting: Emergency Medicine

## 2020-01-23 DIAGNOSIS — Z20822 Contact with and (suspected) exposure to covid-19: Secondary | ICD-10-CM | POA: Diagnosis not present

## 2020-01-23 DIAGNOSIS — R059 Cough, unspecified: Secondary | ICD-10-CM | POA: Diagnosis not present

## 2020-01-23 DIAGNOSIS — J069 Acute upper respiratory infection, unspecified: Secondary | ICD-10-CM | POA: Diagnosis not present

## 2020-01-23 LAB — RESPIRATORY PANEL BY RT PCR (FLU A&B, COVID)
Influenza A by PCR: NEGATIVE
Influenza B by PCR: NEGATIVE
SARS Coronavirus 2 by RT PCR: NEGATIVE

## 2020-01-23 LAB — GROUP A STREP BY PCR: Group A Strep by PCR: NOT DETECTED

## 2020-01-23 MED ORDER — GUAIFENESIN-DM 100-10 MG/5ML PO SYRP: 5.0000 mL | ORAL_SOLUTION | Freq: Three times a day (TID) | ORAL | 0 refills | Status: DC | PRN

## 2020-01-23 MED ORDER — GUAIFENESIN-DM 100-10 MG/5ML PO SYRP
5.0000 mL | ORAL_SOLUTION | Freq: Once | ORAL | Status: AC
  Administered 2020-01-23: 5 mL via ORAL
  Filled 2020-01-23: qty 10

## 2020-01-23 NOTE — ED Triage Notes (Signed)
Pt states that she has had cough x 3 days and increasingly weak and tired. Alert and oriented.

## 2020-01-23 NOTE — ED Provider Notes (Signed)
[facility]-EMERGENCY DEPT Provider Note   CSN: [id number] Arrival date & time: 01/23/20  1805     History Chief Complaint  Patient presents with  . Cough  . Generalized Body Aches    Monique Bryan is a 29 y.o. female.  HPI Patient presents with URI type symptoms.  Began around 3 days ago.  Starts with sore throat.  Had some congestion.  Also states that she has a cough.  States cough is minimal production but states she is being annoyed by having trouble sleeping.  States she does have some weakness.  No fevers or chills.  No known sick contacts.  Patient has not had her Covid vaccination.  No known sick contacts.  States 2 days ago she had a negative rapid Covid test.  Patient denies possibility of pregnancy.    Past Medical History:  Diagnosis Date  . Medical history non-contributory   . Obesity   . PCOS (polycystic ovarian syndrome)     Patient Active Problem List   Diagnosis Date Noted  . Obesity 01/11/2019  . Depression 01/11/2019  . PCOS (polycystic ovarian syndrome) 01/11/2019  . PIH (pregnancy induced hypertension), antepartum 03/26/2018    Past Surgical History:  Procedure Laterality Date  . DILATATION & CURETTAGE/HYSTEROSCOPY WITH MYOSURE N/A 01/21/2019   Procedure: DILATATION & CURETTAGE/HYSTEROSCOPY WITH MYOSURE;  Surgeon: Ranae Pila, MD;  Location: [facility] OR;  Service: Gynecology;  Laterality: N/A;  . INTRAUTERINE DEVICE (IUD) INSERTION N/A 01/21/2019   Procedure: INTRAUTERINE DEVICE (IUD) INSERTION;  Surgeon: Ranae Pila, MD;  Location: [facility] OR;  Service: Gynecology;  Laterality: N/A;  Mirena   . TONSILLECTOMY       OB History    Gravida  1   Para  1   Term  1   Preterm      AB      Living  1     SAB      TAB      Ectopic      Multiple  0   Live Births  1           Family History  Problem Relation Age of Onset  . Sickle cell trait Mother     Social History   Tobacco Use  . Smoking  status: Never Smoker  . Smokeless tobacco: Never Used  Vaping Use  . Vaping Use: Never used  Substance Use Topics  . Alcohol use: No  . Drug use: Not Currently    Types: Marijuana    Home Medications Prior to Admission medications   Medication Sig Start Date End Date Taking? Authorizing Provider  acetaminophen (TYLENOL) 500 MG tablet Take 500 mg by mouth every 6 (six) hours as needed for moderate pain.   Yes [provider]  ibuprofen (ADVIL) 200 MG tablet Take 200 mg by mouth every 6 (six) hours as needed for moderate pain.   Yes [provider]  guaiFENesin-dextromethorphan (ROBITUSSIN DM) 100-10 MG/5ML syrup Take 5 mLs by mouth 3 (three) times daily as needed for cough. 01/23/20   Benjiman Core, MD  ibuprofen (ADVIL,MOTRIN) 600 MG tablet Take 1 tablet (600 mg total) by mouth every 6 (six) hours as needed for moderate pain. Patient not taking: Reported on 01/15/2019 03/29/18   Janeece Riggers, CNM    Allergies    Patient has no known allergies.  Review of Systems   Review of Systems  Constitutional: Positive for fatigue. Negative for fever.  HENT: Positive for sore throat.  Respiratory: Positive for cough.   Cardiovascular: Negative for chest pain.  Gastrointestinal: Negative for abdominal pain, diarrhea, nausea and vomiting.  Genitourinary: Negative for flank pain.  Musculoskeletal: Negative for back pain.  Skin: Negative for pallor.  Neurological: Negative for weakness.    Physical Exam Updated Vital Signs BP (!) 154/97   Pulse 90   Temp 98.8 F (37.1 C)   Resp 18   SpO2 98%   Physical Exam Vitals and nursing note reviewed.  Constitutional:      Appearance: She is obese.  HENT:     Head: Atraumatic.     Nose: Congestion present.     Mouth/Throat:     Mouth: Mucous membranes are moist.     Pharynx: Posterior oropharyngeal erythema present. No oropharyngeal exudate.  Eyes:     Pupils: Pupils are equal, round, and reactive to light.    Cardiovascular:     Rate and Rhythm: Regular rhythm.  Pulmonary:     Breath sounds: No wheezing, rhonchi or rales.     Comments: Mildly harsh breath sounds without focal rales or rhonchi. Abdominal:     Tenderness: There is no abdominal tenderness.  Musculoskeletal:     Cervical back: Neck supple.     Right lower leg: No edema.     Left lower leg: No edema.  Skin:    General: Skin is warm.     Capillary Refill: Capillary refill takes less than 2 seconds.  Neurological:     Mental Status: She is alert and oriented to person, place, and time.  Psychiatric:        Mood and Affect: Mood normal.     ED Results / Procedures / Treatments   Labs (all labs ordered are listed, but only abnormal results are displayed) Labs Reviewed  RESPIRATORY PANEL BY RT PCR (FLU A&B, COVID)  GROUP A STREP BY PCR    EKG None  Radiology DG Chest Portable 1 View  Result Date: 01/23/2020 CLINICAL DATA:  Cough EXAM: PORTABLE CHEST 1 VIEW COMPARISON:  None. FINDINGS: The heart size and mediastinal contours are within normal limits. Both lungs are clear. The visualized skeletal structures are unremarkable. IMPRESSION: No active disease. Electronically Signed   By: Jonna Clark M.D.   On: 01/23/2020 19:36    Procedures Procedures (including critical care time)  Medications Ordered in ED Medications  guaiFENesin-dextromethorphan (ROBITUSSIN DM) 100-10 MG/5ML syrup 5 mL (5 mLs Oral Given 01/23/20 2055)    ED Course  I have reviewed the triage vital signs and the nursing notes.  Pertinent labs & imaging results that were available during my care of the patient were reviewed by me and considered in my medical decision making (see chart for details).    MDM Rules/Calculators/A&P                          Patient with URI symptoms.  Has had for around 3 days.  Negative Covid test and negative strep test here.  X-ray reassuring.  Will discharge home with symptomatic treatment.  Likely other viral  infection.  No pneumonia is seen.  Doubt pulmonary embolism.  Patient is not hypoxic and appears stable for discharge.  I have reviewed imaging and past history. Final Clinical Impression(s) / ED Diagnoses Final diagnoses:  Upper respiratory tract infection, unspecified type    Rx / DC Orders ED Discharge Orders         Ordered    guaiFENesin-dextromethorphan (ROBITUSSIN DM)  100-10 MG/5ML syrup  3 times daily PRN        01/23/20 2049           Benjiman Core, MD 01/23/20 2328

## 2020-02-25 ENCOUNTER — Ambulatory Visit: Payer: BC Managed Care – PPO | Admitting: Psychology

## 2020-03-03 ENCOUNTER — Ambulatory Visit: Payer: BC Managed Care – PPO | Admitting: Psychology

## 2020-03-10 ENCOUNTER — Ambulatory Visit: Payer: BC Managed Care – PPO | Admitting: Psychology

## 2020-03-23 ENCOUNTER — Ambulatory Visit: Payer: BC Managed Care – PPO | Admitting: Psychology

## 2020-03-26 DIAGNOSIS — Z1159 Encounter for screening for other viral diseases: Secondary | ICD-10-CM | POA: Diagnosis not present

## 2020-04-08 ENCOUNTER — Ambulatory Visit: Payer: BC Managed Care – PPO | Admitting: Psychology

## 2020-04-10 DIAGNOSIS — Z1159 Encounter for screening for other viral diseases: Secondary | ICD-10-CM | POA: Diagnosis not present

## 2020-06-04 ENCOUNTER — Emergency Department (HOSPITAL_COMMUNITY)
Admission: EM | Admit: 2020-06-04 | Discharge: 2020-06-04 | Disposition: A | Discharge status: Home / Self Care | Payer: BC Managed Care – PPO | Attending: Emergency Medicine | Admitting: Emergency Medicine

## 2020-06-04 ENCOUNTER — Emergency Department (HOSPITAL_COMMUNITY): Payer: BC Managed Care – PPO

## 2020-06-04 DIAGNOSIS — M25572 Pain in left ankle and joints of left foot: Secondary | ICD-10-CM | POA: Diagnosis not present

## 2020-06-04 DIAGNOSIS — M7989 Other specified soft tissue disorders: Secondary | ICD-10-CM | POA: Diagnosis not present

## 2020-06-04 DIAGNOSIS — S93492A Sprain of other ligament of left ankle, initial encounter: Secondary | ICD-10-CM | POA: Insufficient documentation

## 2020-06-04 DIAGNOSIS — W01198A Fall on same level from slipping, tripping and stumbling with subsequent striking against other object, initial encounter: Secondary | ICD-10-CM | POA: Insufficient documentation

## 2020-06-04 DIAGNOSIS — S99912A Unspecified injury of left ankle, initial encounter: Secondary | ICD-10-CM | POA: Diagnosis not present

## 2020-06-04 DIAGNOSIS — M79673 Pain in unspecified foot: Secondary | ICD-10-CM

## 2020-06-04 DIAGNOSIS — M79672 Pain in left foot: Secondary | ICD-10-CM | POA: Diagnosis not present

## 2020-06-04 MED ORDER — OXYCODONE HCL 5 MG PO TABS
5.0000 mg | ORAL_TABLET | Freq: Once | ORAL | Status: AC
  Administered 2020-06-04: 5 mg via ORAL
  Filled 2020-06-04: qty 1

## 2020-06-04 MED ORDER — ACETAMINOPHEN 500 MG PO TABS
1000.0000 mg | ORAL_TABLET | Freq: Once | ORAL | Status: AC
  Administered 2020-06-04: 1000 mg via ORAL
  Filled 2020-06-04: qty 2

## 2020-06-04 NOTE — Discharge Instructions (Signed)
Take 4 over the counter ibuprofen tablets 3 times a day or 2 over-the-counter naproxen tablets twice a day for pain. Also take tylenol 1000mg(2 extra strength) four times a day.    

## 2020-06-04 NOTE — Progress Notes (Signed)
TOC CM spoke to pt and states she is going to use her crutches. Explained she can rent Knee Scooter for $75 per month at [facility] or contact her insurance to see if knee scooter is a covered DME. Isidoro Donning RN CCM, [facility] ED TOC CM [phone]

## 2020-06-04 NOTE — ED Provider Notes (Signed)
[facility]-EMERGENCY DEPT Provider Note   CSN: [id number] Arrival date & time: 06/04/20  1137     History Chief Complaint  Patient presents with  . Ankle Injury    Monique Bryan is a 30 y.o. female.  30 yo F with a chief complaints of left ankle pain.  The patient was trying to step over her child's life that was on the ground.  She thinks her child may be bumped slightly she was trying to step over it and she got her ankle twisted up in the slide.  This happened yesterday and she was able to walk on it and went to bed.  When she woke up it was much more painful and swollen.  She took some Tylenol yesterday with some improvement.  She denies other injury.  Denies pain to the knee.  The history is provided by the patient.  Ankle Injury This is a new problem. The current episode started yesterday. The problem occurs constantly. The problem has been gradually worsening. Pertinent negatives include no chest pain, no headaches and no shortness of breath. The symptoms are aggravated by bending, twisting and walking. Nothing relieves the symptoms. She has tried nothing for the symptoms. The treatment provided no relief.       Past Medical History:  Diagnosis Date  . Medical history non-contributory   . Obesity   . PCOS (polycystic ovarian syndrome)     Patient Active Problem List   Diagnosis Date Noted  . Obesity 01/11/2019  . Depression 01/11/2019  . PCOS (polycystic ovarian syndrome) 01/11/2019  . PIH (pregnancy induced hypertension), antepartum 03/26/2018    Past Surgical History:  Procedure Laterality Date  . DILATATION & CURETTAGE/HYSTEROSCOPY WITH MYOSURE N/A 01/21/2019   Procedure: DILATATION & CURETTAGE/HYSTEROSCOPY WITH MYOSURE;  Surgeon: Ranae Pila, MD;  Location: [facility] OR;  Service: Gynecology;  Laterality: N/A;  . INTRAUTERINE DEVICE (IUD) INSERTION N/A 01/21/2019   Procedure: INTRAUTERINE DEVICE (IUD) INSERTION;  Surgeon: Ranae Pila, MD;  Location: [facility] OR;  Service: Gynecology;  Laterality: N/A;  Mirena   . TONSILLECTOMY       OB History    Gravida  1   Para  1   Term  1   Preterm      AB      Living  1     SAB      IAB      Ectopic      Multiple  0   Live Births  1           Family History  Problem Relation Age of Onset  . Sickle cell trait Mother     Social History   Tobacco Use  . Smoking status: Never Smoker  . Smokeless tobacco: Never Used  Vaping Use  . Vaping Use: Never used  Substance Use Topics  . Alcohol use: No  . Drug use: Not Currently    Types: Marijuana    Home Medications Prior to Admission medications   Medication Sig Start Date End Date Taking? Authorizing Provider  acetaminophen (TYLENOL) 500 MG tablet Take 500 mg by mouth every 6 (six) hours as needed for moderate pain.    [provider]  guaiFENesin-dextromethorphan (ROBITUSSIN DM) 100-10 MG/5ML syrup Take 5 mLs by mouth 3 (three) times daily as needed for cough. 01/23/20   Benjiman Core, MD  ibuprofen (ADVIL) 200 MG tablet Take 200 mg by mouth every 6 (six) hours as needed for moderate pain.  [provider]  ibuprofen (ADVIL,MOTRIN) 600 MG tablet Take 1 tablet (600 mg total) by mouth every 6 (six) hours as needed for moderate pain. Patient not taking: Reported on 01/15/2019 03/29/18   Janeece Riggers, CNM    Allergies    Patient has no known allergies.  Review of Systems   Review of Systems  Constitutional: Negative for chills and fever.  HENT: Negative for congestion and rhinorrhea.   Eyes: Negative for redness and visual disturbance.  Respiratory: Negative for shortness of breath and wheezing.   Cardiovascular: Negative for chest pain and palpitations.  Gastrointestinal: Negative for nausea and vomiting.  Genitourinary: Negative for dysuria and urgency.  Musculoskeletal: Positive for arthralgias. Negative for myalgias.  Skin: Negative for pallor and wound.   Neurological: Negative for dizziness and headaches.    Physical Exam Updated Vital Signs BP (!) 164/91 (BP Location: Right Arm)   Pulse 88   Temp 98.1 F (36.7 C) (Oral)   Resp 16   SpO2 100%   Physical Exam Vitals and nursing note reviewed.  Constitutional:      General: She is not in acute distress.    Appearance: She is well-developed. She is not diaphoretic.  HENT:     Head: Normocephalic and atraumatic.  Eyes:     Pupils: Pupils are equal, round, and reactive to light.  Cardiovascular:     Rate and Rhythm: Normal rate and regular rhythm.     Heart sounds: No murmur heard. No friction rub. No gallop.   Pulmonary:     Effort: Pulmonary effort is normal.     Breath sounds: No wheezing or rales.  Abdominal:     General: There is no distension.     Palpations: Abdomen is soft.     Tenderness: There is no abdominal tenderness.  Musculoskeletal:        General: Tenderness present.     Cervical back: Normal range of motion and neck supple.     Comments: Tenderness and swelling to the left ankle at the area of the anterior talofibular ligament.  Pain along the distal attachment to the foot.  No pain along the medial or lateral malleolus.  No pain along the navicular.  Mild pain along the base of the fifth metatarsal.  Pulse motor and sensation intact.  No pain at the fibular neck.  Skin:    General: Skin is warm and dry.  Neurological:     Mental Status: She is alert and oriented to person, place, and time.  Psychiatric:        Behavior: Behavior normal.     ED Results / Procedures / Treatments   Labs (all labs ordered are listed, but only abnormal results are displayed) Labs Reviewed - No data to display  EKG None  Radiology DG Ankle Complete Left  Result Date: 06/04/2020 CLINICAL DATA:  Lateral left foot and ankle pain after fall EXAM: LEFT ANKLE COMPLETE - 3+ VIEW; LEFT FOOT - COMPLETE 3+ VIEW COMPARISON:  None. FINDINGS: Possible nondisplaced fracture of the  lateral malleolus along its medial cortex. No additional fractures are identified. No malalignment. Ankle mortise is congruent. Mild dorsal hypertrophy at the second TMT joint. Joint spaces are maintained. Mild soft tissue swelling at the lateral ankle. IMPRESSION: Possible nondisplaced fracture of the lateral malleolus with overlying soft tissue swelling. Correlate with point tenderness. Electronically Signed   By: Duanne Guess D.O.   On: 06/04/2020 12:19   DG Foot Complete Left  Result Date: 06/04/2020  CLINICAL DATA:  Lateral left foot and ankle pain after fall EXAM: LEFT ANKLE COMPLETE - 3+ VIEW; LEFT FOOT - COMPLETE 3+ VIEW COMPARISON:  None. FINDINGS: Possible nondisplaced fracture of the lateral malleolus along its medial cortex. No additional fractures are identified. No malalignment. Ankle mortise is congruent. Mild dorsal hypertrophy at the second TMT joint. Joint spaces are maintained. Mild soft tissue swelling at the lateral ankle. IMPRESSION: Possible nondisplaced fracture of the lateral malleolus with overlying soft tissue swelling. Correlate with point tenderness. Electronically Signed   By: Duanne Guess D.O.   On: 06/04/2020 12:19    Procedures Procedures   Medications Ordered in ED Medications  acetaminophen (TYLENOL) tablet 1,000 mg (1,000 mg Oral Given 06/04/20 1356)  oxyCODONE (Oxy IR/ROXICODONE) immediate release tablet 5 mg (5 mg Oral Given 06/04/20 1356)    ED Course  I have reviewed the triage vital signs and the nursing notes.  Pertinent labs & imaging results that were available during my care of the patient were reviewed by me and considered in my medical decision making (see chart for details).    MDM Rules/Calculators/A&P                          29 yo F with a chief complaints of left ankle pain.  Most likely the patient has an ATF sprain based on exam.  Plain film viewed by me without fracture.  Radiology read with questionable nondisplaced fracture of the  lateral malleolus.  She has no tender lump to malleolus tenderness is primarily at the attachment of the ATF to the foot.  Will place in an ASO crutches PCP follow-up.  2:02 PM:  I have discussed the diagnosis/risks/treatment options with the patient and believe the pt to be eligible for discharge home to follow-up with PCP. We also discussed returning to the ED immediately if new or worsening sx occur. We discussed the sx which are most concerning (e.g., sudden worsening pain, fever, inability to tolerate by mouth) that necessitate immediate return. Medications administered to the patient during their visit and any new prescriptions provided to the patient are listed below.  Medications given during this visit Medications  acetaminophen (TYLENOL) tablet 1,000 mg (1,000 mg Oral Given 06/04/20 1356)  oxyCODONE (Oxy IR/ROXICODONE) immediate release tablet 5 mg (5 mg Oral Given 06/04/20 1356)     The patient appears reasonably screen and/or stabilized for discharge and I doubt any other medical condition or other [facility] requiring further screening, evaluation, or treatment in the ED at this time prior to discharge.   Final Clinical Impression(s) / ED Diagnoses Final diagnoses:  Foot pain  Sprain of anterior talofibular ligament of left ankle, initial encounter    Rx / DC Orders ED Discharge Orders    None       Melene Plan, DO 06/04/20 1402

## 2020-06-04 NOTE — ED Triage Notes (Signed)
Pt complains of left ankle pain since tripping and falling yesterday night. She has been able to bear weight since the injury.

## 2020-08-03 ENCOUNTER — Other Ambulatory Visit: Payer: Self-pay

## 2020-08-03 ENCOUNTER — Emergency Department (HOSPITAL_COMMUNITY)
Admission: EM | Admit: 2020-08-03 | Discharge: 2020-08-03 | Disposition: A | Discharge status: Home / Self Care | Payer: BC Managed Care – PPO | Attending: Emergency Medicine | Admitting: Emergency Medicine

## 2020-08-03 ENCOUNTER — Emergency Department (HOSPITAL_COMMUNITY): Payer: BC Managed Care – PPO

## 2020-08-03 ENCOUNTER — Encounter (HOSPITAL_COMMUNITY): Payer: Self-pay | Admitting: Emergency Medicine

## 2020-08-03 DIAGNOSIS — S63681A Other sprain of right thumb, initial encounter: Secondary | ICD-10-CM | POA: Insufficient documentation

## 2020-08-03 DIAGNOSIS — Y92410 Unspecified street and highway as the place of occurrence of the external cause: Secondary | ICD-10-CM | POA: Diagnosis not present

## 2020-08-03 DIAGNOSIS — R202 Paresthesia of skin: Secondary | ICD-10-CM | POA: Insufficient documentation

## 2020-08-03 DIAGNOSIS — S63601A Unspecified sprain of right thumb, initial encounter: Secondary | ICD-10-CM | POA: Diagnosis not present

## 2020-08-03 DIAGNOSIS — S63641A Sprain of metacarpophalangeal joint of right thumb, initial encounter: Secondary | ICD-10-CM | POA: Diagnosis not present

## 2020-08-03 DIAGNOSIS — M79644 Pain in right finger(s): Secondary | ICD-10-CM | POA: Diagnosis not present

## 2020-08-03 DIAGNOSIS — S6991XA Unspecified injury of right wrist, hand and finger(s), initial encounter: Secondary | ICD-10-CM | POA: Diagnosis not present

## 2020-08-03 MED ORDER — IBUPROFEN 800 MG PO TABS
800.0000 mg | ORAL_TABLET | Freq: Once | ORAL | Status: AC
  Administered 2020-08-03: 800 mg via ORAL
  Filled 2020-08-03: qty 1

## 2020-08-03 MED ORDER — IBUPROFEN 600 MG PO TABS: 600.0000 mg | ORAL_TABLET | Freq: Four times a day (QID) | ORAL | 0 refills | Status: DC | PRN

## 2020-08-03 MED ORDER — CYCLOBENZAPRINE HCL 10 MG PO TABS: 10.0000 mg | ORAL_TABLET | Freq: Two times a day (BID) | ORAL | 0 refills | Status: DC | PRN
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
## 2020-09-04 ENCOUNTER — Encounter (HOSPITAL_COMMUNITY): Payer: Self-pay | Admitting: Emergency Medicine

## 2020-09-04 ENCOUNTER — Emergency Department (HOSPITAL_COMMUNITY)
Admission: EM | Admit: 2020-09-04 | Discharge: 2020-09-05 | Disposition: A | Discharge status: Home / Self Care | Payer: BC Managed Care – PPO | Attending: Emergency Medicine | Admitting: Emergency Medicine

## 2020-09-04 ENCOUNTER — Emergency Department (HOSPITAL_COMMUNITY): Payer: BC Managed Care – PPO

## 2020-09-04 DIAGNOSIS — M545 Low back pain, unspecified: Secondary | ICD-10-CM | POA: Insufficient documentation

## 2020-09-04 DIAGNOSIS — M5459 Other low back pain: Secondary | ICD-10-CM | POA: Diagnosis not present

## 2020-09-04 DIAGNOSIS — R1031 Right lower quadrant pain: Secondary | ICD-10-CM | POA: Insufficient documentation

## 2020-09-04 LAB — CBC WITH DIFFERENTIAL/PLATELET
Abs Immature Granulocytes: 0.05 10*3/uL (ref 0.00–0.07)
Basophils Absolute: 0.1 10*3/uL (ref 0.0–0.1)
Basophils Relative: 0 %
Eosinophils Absolute: 0.3 10*3/uL (ref 0.0–0.5)
Eosinophils Relative: 2 %
HCT: 41.8 % (ref 36.0–46.0)
Hemoglobin: 13.6 g/dL (ref 12.0–15.0)
Immature Granulocytes: 0 %
Lymphocytes Relative: 30 %
Lymphs Abs: 3.8 10*3/uL (ref 0.7–4.0)
MCH: 29.1 pg (ref 26.0–34.0)
MCHC: 32.5 g/dL (ref 30.0–36.0)
MCV: 89.3 fL (ref 80.0–100.0)
Monocytes Absolute: 0.7 10*3/uL (ref 0.1–1.0)
Monocytes Relative: 6 %
Neutro Abs: 7.8 10*3/uL — ABNORMAL HIGH (ref 1.7–7.7)
Neutrophils Relative %: 62 %
Platelets: 293 10*3/uL (ref 150–400)
RBC: 4.68 MIL/uL (ref 3.87–5.11)
RDW: 13.8 % (ref 11.5–15.5)
WBC: 12.7 10*3/uL — ABNORMAL HIGH (ref 4.0–10.5)
nRBC: 0 % (ref 0.0–0.2)

## 2020-09-04 LAB — URINALYSIS, ROUTINE W REFLEX MICROSCOPIC
Bilirubin Urine: NEGATIVE
Glucose, UA: NEGATIVE mg/dL
Hgb urine dipstick: NEGATIVE
Ketones, ur: 5 mg/dL — AB
Leukocytes,Ua: NEGATIVE
Nitrite: NEGATIVE
Protein, ur: NEGATIVE mg/dL
Specific Gravity, Urine: 1.031 — ABNORMAL HIGH (ref 1.005–1.030)
pH: 5 (ref 5.0–8.0)

## 2020-09-04 LAB — PREGNANCY, URINE: Preg Test, Ur: NEGATIVE

## 2020-09-04 NOTE — ED Triage Notes (Signed)
Patient here from home reporting right sided abd pain radiating down into groin that started Thursday.

## 2020-09-04 NOTE — ED Provider Notes (Signed)
[facility]-EMERGENCY DEPT Provider Note   CSN: [id number] Arrival date & time: 09/04/20  2227     History Chief Complaint  Patient presents with   Abdominal Pain   Groin Pain    Monique Bryan is a 30 y.o. female.  30 year old female with a history of obesity, PCOS presents to the emergency department for evaluation of right-sided groin pain which began 3 days ago.  Symptoms have been constant and mild to moderately improved with NSAIDs and use of a muscle relaxer.  She notices worsening of her pain with flexion of her right hip, bending forward.  Cannot recall any stress, strain, trauma inciting her symptoms.  Denies fevers, vomiting, diarrhea, dysuria, hematuria, bowel or bladder incontinence, genital or perianal numbness, extremity weakness, vaginal complaints.  Secondary amenorrhea 2/2 IUD use.  Denies hx of cancer and IVDU.  The history is provided by the patient. No language interpreter was used.  Abdominal Pain Groin Pain Associated symptoms include abdominal pain.      Past Medical History:  Diagnosis Date   Medical history non-contributory    Obesity    PCOS (polycystic ovarian syndrome)     Patient Active Problem List   Diagnosis Date Noted   Obesity 01/11/2019   Depression 01/11/2019   PCOS (polycystic ovarian syndrome) 01/11/2019   PIH (pregnancy induced hypertension), antepartum 03/26/2018    Past Surgical History:  Procedure Laterality Date   DILATATION & CURETTAGE/HYSTEROSCOPY WITH MYOSURE N/A 01/21/2019   Procedure: DILATATION & CURETTAGE/HYSTEROSCOPY WITH MYOSURE;  Surgeon: Ranae Pila, MD;  Location: [facility] OR;  Service: Gynecology;  Laterality: N/A;   INTRAUTERINE DEVICE (IUD) INSERTION N/A 01/21/2019   Procedure: INTRAUTERINE DEVICE (IUD) INSERTION;  Surgeon: Ranae Pila, MD;  Location: [facility] OR;  Service: Gynecology;  Laterality: N/A;  Mirena    TONSILLECTOMY       OB History     Gravida  1   Para  1    Term  1   Preterm      AB      Living  1      SAB      IAB      Ectopic      Multiple  0   Live Births  1           Family History  Problem Relation Age of Onset   Sickle cell trait Mother     Social History   Tobacco Use   Smoking status: Never   Smokeless tobacco: Never  Vaping Use   Vaping Use: Never used  Substance Use Topics   Alcohol use: No   Drug use: Not Currently    Types: Marijuana    Home Medications Prior to Admission medications   Medication Sig Start Date End Date Taking? Authorizing Provider  acetaminophen (TYLENOL) 500 MG tablet Take 500 mg by mouth every 6 (six) hours as needed for moderate pain.   Yes [provider]  cyclobenzaprine (FLEXERIL) 10 MG tablet Take 1 tablet (10 mg total) by mouth 2 (two) times daily as needed for muscle spasms. 08/03/20  Yes Fayrene Helper, PA-C  ibuprofen (ADVIL) 600 MG tablet Take 1 tablet (600 mg total) by mouth every 6 (six) hours as needed. 08/03/20  Yes Fayrene Helper, PA-C  Levonorgestrel (SKYLA) 13.5 MG IUD Placed 2020   Yes [provider]  predniSONE (DELTASONE) 20 MG tablet Take 2 tablets (40 mg total) by mouth daily. Take 40 mg by mouth daily for 3 days,  then 20mg  by mouth daily for 3 days, then 10mg  daily for 3 days 09/05/20  Yes , PA-C  guaiFENesin-dextromethorphan (ROBITUSSIN DM) 100-10 MG/5ML syrup Take 5 mLs by mouth 3 (three) times daily as needed for cough. Patient not taking: Reported on 09/04/2020 01/23/20   09/06/2020, MD    Allergies    Patient has no known allergies.  Review of Systems   Review of Systems  Gastrointestinal:  Positive for abdominal pain.  Ten systems reviewed and are negative for acute change, except as noted in the HPI.    Physical Exam Updated Vital Signs BP 140/75 (BP Location: Left Arm)   Pulse 84   Temp 98.7 F (37.1 C) (Oral)   Resp 16   SpO2 99%   Physical Exam Vitals and nursing note reviewed.  Constitutional:       General: She is not in acute distress.    Appearance: She is well-developed. She is not diaphoretic.     Comments: Obese AA female. Pleasant and nontoxic.  HENT:     Head: Normocephalic and atraumatic.  Eyes:     General: No scleral icterus.    Conjunctiva/sclera: Conjunctivae normal.  Cardiovascular:     Rate and Rhythm: Normal rate and regular rhythm.     Pulses: Normal pulses.  Pulmonary:     Effort: Pulmonary effort is normal. No respiratory distress.     Comments: Respirations even and unlabored Abdominal:     Comments: Soft, obese, nontender abdomen.  Musculoskeletal:        General: Normal range of motion.     Cervical back: Normal range of motion.     Comments: No distinct TTP to b/l hips. Full ROM of BLE. No bony deformities, step offs, crepitus to the lumbosacral midline.  Skin:    General: Skin is warm and dry.     Coloration: Skin is not pale.     Findings: No erythema or rash.  Neurological:     Mental Status: She is alert and oriented to person, place, and time.  Psychiatric:        Behavior: Behavior normal.    ED Results / Procedures / Treatments   Labs (all labs ordered are listed, but only abnormal results are displayed) Labs Reviewed  CBC WITH DIFFERENTIAL/PLATELET - Abnormal; Notable for the following components:      Result Value   WBC 12.7 (*)    Neutro Abs 7.8 (*)    All other components within normal limits  COMPREHENSIVE METABOLIC PANEL - Abnormal; Notable for the following components:   Glucose, Bld 101 (*)    AST 12 (*)    All other components within normal limits  URINALYSIS, ROUTINE W REFLEX MICROSCOPIC - Abnormal; Notable for the following components:   APPearance HAZY (*)    Specific Gravity, Urine 1.031 (*)    Ketones, ur 5 (*)    All other components within normal limits  PREGNANCY, URINE    EKG None  Radiology DG Lumbar Spine Complete  Result Date: 09/05/2020 CLINICAL DATA:  Low back pain, initial encounter EXAM: LUMBAR SPINE  - COMPLETE 4 VIEW COMPARISON:  None. FINDINGS: Five lumbar type vertebral bodies are well visualized. Vertebral body height is well maintained. No pars defects are noted. No anterolisthesis is seen. Mild disc space narrowing at L4-5 and L5-S1 is seen. No soft tissue abnormality is noted. IMPRESSION: Mild degenerative change without acute abnormality. Electronically Signed   By: Benjiman Core M.D.   On: 09/05/2020 00:56  Procedures Procedures   Medications Ordered in ED Medications  ketorolac (TORADOL) 15 MG/ML injection 30 mg (30 mg Intramuscular Given 09/05/20 0117)    ED Course  I have reviewed the triage vital signs and the nursing notes.  Pertinent labs & imaging results that were available during my care of the patient were reviewed by me and considered in my medical decision making (see chart for details).    MDM Rules/Calculators/A&P                          30 year old female presents to the emergency department for evaluation of right-sided groin pain and right low back pain.  Her back pain is exacerbated with right hip flexion.  Despite areas of discomfort, there is no reproducible tenderness to palpation.  Denies history of trauma.  No red flags or signs concerning for cauda equina.  Work-up today is notable for some mild degenerative changes in the lumbar spine.  Her symptoms do sound consistent with pain of lumbosacral etiology.  Labs reassuring.  No abdominal tenderness to palpation, vomiting, bowel changes, urinary symptoms.  Feeling a bit better after receiving Toradol.  Counseled on close outpatient PCP follow-up.  Will trial on taper of prednisone for symptom management.  Return precautions discussed and provided. Patient discharged in stable condition with no unaddressed concerns.   Final Clinical Impression(s) / ED Diagnoses Final diagnoses:  Acute right-sided low back pain, unspecified whether sciatica present    Rx / DC Orders ED Discharge Orders          Ordered     predniSONE (DELTASONE) 20 MG tablet  Daily        09/05/20 0219             Antony Madura, PA-C 09/05/20 0310    Molpus, Jonny Ruiz, MD 09/05/20 [phone]

## 2020-09-05 ENCOUNTER — Emergency Department (HOSPITAL_COMMUNITY): Payer: BC Managed Care – PPO

## 2020-09-05 DIAGNOSIS — M545 Low back pain, unspecified: Secondary | ICD-10-CM | POA: Diagnosis not present

## 2020-09-05 LAB — COMPREHENSIVE METABOLIC PANEL
ALT: 21 U/L (ref 0–44)
AST: 12 U/L — ABNORMAL LOW (ref 15–41)
Albumin: 3.8 g/dL (ref 3.5–5.0)
Alkaline Phosphatase: 77 U/L (ref 38–126)
Anion gap: 6 (ref 5–15)
BUN: 15 mg/dL (ref 6–20)
CO2: 28 mmol/L (ref 22–32)
Calcium: 9.1 mg/dL (ref 8.9–10.3)
Chloride: 106 mmol/L (ref 98–111)
Creatinine, Ser: 0.92 mg/dL (ref 0.44–1.00)
GFR, Estimated: >60 mL/min (ref >60)
Glucose, Bld: 101 mg/dL — ABNORMAL HIGH (ref 70–99)
Potassium: 4 mmol/L (ref 3.5–5.1)
Sodium: 140 mmol/L (ref 135–145)
Total Bilirubin: 0.4 mg/dL (ref 0.3–1.2)
Total Protein: 7.7 g/dL (ref 6.5–8.1)

## 2020-09-05 MED ORDER — PREDNISONE 20 MG PO TABS: 40.0000 mg | ORAL_TABLET | Freq: Every day | ORAL | 0 refills | Status: DC

## 2020-09-05 MED ORDER — KETOROLAC TROMETHAMINE 15 MG/ML IJ SOLN
30.0000 mg | Freq: Once | INTRAMUSCULAR | Status: AC
  Administered 2020-09-05: 30 mg via INTRAMUSCULAR
  Filled 2020-09-05: qty 2

## 2020-09-05 NOTE — Discharge Instructions (Addendum)
Your work-up in the emergency department was reassuring.  We recommend follow-up with your primary care doctor.  Avoid strenuous activity and heavy lifting.  Return for new or concerning symptoms.

## 2020-09-08 ENCOUNTER — Ambulatory Visit (INDEPENDENT_AMBULATORY_CARE_PROVIDER_SITE_OTHER): Payer: BC Managed Care – PPO | Admitting: Internal Medicine

## 2020-09-08 ENCOUNTER — Other Ambulatory Visit: Payer: Self-pay

## 2020-09-08 ENCOUNTER — Encounter: Payer: Self-pay | Admitting: Internal Medicine

## 2020-09-08 VITALS — BP 118/62 | HR 78 | Temp 98.3°F | Ht 69.25 in | Wt 360.0 lb

## 2020-09-08 DIAGNOSIS — Z0001 Encounter for general adult medical examination with abnormal findings: Secondary | ICD-10-CM

## 2020-09-08 DIAGNOSIS — E559 Vitamin D deficiency, unspecified: Secondary | ICD-10-CM

## 2020-09-08 DIAGNOSIS — Z6841 Body Mass Index (BMI) 40.0 and over, adult: Secondary | ICD-10-CM

## 2020-09-08 LAB — LIPID PANEL
Cholesterol: 133 mg/dL (ref 0–200)
HDL: 44 mg/dL (ref >39.00)
LDL Cholesterol: 65 mg/dL (ref 0–99)
NonHDL: 89.07
Total CHOL/HDL Ratio: 3
Triglycerides: 119 mg/dL (ref 0.0–149.0)
VLDL: 23.8 mg/dL (ref 0.0–40.0)

## 2020-09-08 LAB — VITAMIN D 25 HYDROXY (VIT D DEFICIENCY, FRACTURES): VITD: 13.35 ng/mL — ABNORMAL LOW (ref 30.00–100.00)

## 2020-09-08 LAB — HEMOGLOBIN A1C: Hgb A1c MFr Bld: 5.8 % (ref 4.6–6.5)

## 2020-09-08 MED ORDER — CHOLECALCIFEROL 1.25 MG (50000 UT) PO CAPS: 50000.0000 [IU] | ORAL_CAPSULE | ORAL | 1 refills | Status: DC

## 2020-09-08 NOTE — Progress Notes (Signed)
It d   Subjective:  Patient ID: Monique Bryan, female    DOB: [birth year]  Age: 30 y.o. MRN: [id number]  CC: New Patient (Initial Visit) and Annual Exam  This visit occurred during the SARS-CoV-2 public health emergency.  Safety protocols were in place, including screening questions prior to the visit, additional usage of staff PPE, and extensive cleaning of exam room while observing appropriate contact time as indicated for disinfecting solutions.    HPI Monique Bryan presents for a CPX and to establish.  She would like to do something to help her lose weight.  She has a history of sleep apnea.  She denies chest pain, shortness of breath, palpitations, edema, or fatigue.  History Monique Bryan has a past medical history of Medical history non-contributory, Obesity, and PCOS (polycystic ovarian syndrome).   She has a past surgical history that includes Tonsillectomy; Dilatation & curettage/hysteroscopy with myosure (N/A, 01/21/2019); and Intrauterine device (iud) insertion (N/A, 01/21/2019).   Her family history includes Sickle cell trait in her mother.She reports that she has never smoked. She has never used smokeless tobacco. She reports previous drug use. Drug: Marijuana. She reports that she does not drink alcohol.  Outpatient Medications Prior to Visit  Medication Sig Dispense Refill   Levonorgestrel (SKYLA) 13.5 MG IUD Placed 2020 (Patient not taking: Reported on 09/08/2020)     acetaminophen (TYLENOL) 500 MG tablet Take 500 mg by mouth every 6 (six) hours as needed for moderate pain. (Patient not taking: Reported on 09/08/2020)     cyclobenzaprine (FLEXERIL) 10 MG tablet Take 1 tablet (10 mg total) by mouth 2 (two) times daily as needed for muscle spasms. (Patient not taking: Reported on 09/08/2020) 20 tablet 0   guaiFENesin-dextromethorphan (ROBITUSSIN DM) 100-10 MG/5ML syrup Take 5 mLs by mouth 3 (three) times daily as needed for cough. (Patient not taking: Reported on 09/08/2020) 118 mL  0   ibuprofen (ADVIL) 600 MG tablet Take 1 tablet (600 mg total) by mouth every 6 (six) hours as needed. (Patient not taking: Reported on 09/08/2020) 30 tablet 0   predniSONE (DELTASONE) 20 MG tablet Take 2 tablets (40 mg total) by mouth daily. Take 40 mg by mouth daily for 3 days, then 20mg  by mouth daily for 3 days, then 10mg  daily for 3 days (Patient not taking: Reported on 09/08/2020) 12 tablet 0   No facility-administered medications prior to visit.    ROS Review of Systems  Constitutional:  Positive for unexpected weight change (wt gain). Negative for diaphoresis and fatigue.  HENT: Negative.    Eyes: Negative.   Respiratory:  Positive for apnea. Negative for shortness of breath and wheezing.   Cardiovascular:  Negative for chest pain and leg swelling.  Gastrointestinal:  Negative for abdominal pain, diarrhea and nausea.  Endocrine: Negative.   Genitourinary: Negative.   Musculoskeletal: Negative.  Negative for arthralgias and myalgias.  Skin: Negative.  Negative for color change.  Allergic/Immunologic: Negative.   Neurological: Negative.  Negative for dizziness, weakness, light-headedness and numbness.  Hematological:  Negative for adenopathy. Does not bruise/bleed easily.  Psychiatric/Behavioral: Negative.     Objective:  BP 118/62 (BP Location: Right Arm, Patient Position: Sitting, Cuff Size: Large)   Pulse 78   Temp 98.3 F (36.8 C) (Oral)   Ht 5' 9.25" (1.759 m)   Wt (!) 360 lb (163.3 kg)   SpO2 98%   BMI 52.78 kg/m   Physical Exam Vitals reviewed.  Constitutional:      Appearance: She is obese.  HENT:  Nose: Nose normal.     Mouth/Throat:     Mouth: Mucous membranes are moist.  Eyes:     General: No scleral icterus.    Conjunctiva/sclera: Conjunctivae normal.  Cardiovascular:     Rate and Rhythm: Normal rate and regular rhythm.     Heart sounds: No murmur heard. Pulmonary:     Effort: Pulmonary effort is normal.     Breath sounds: No stridor. No  wheezing, rhonchi or rales.  Abdominal:     General: Abdomen is protuberant. Bowel sounds are normal. There is no distension.     Palpations: Abdomen is soft. There is no hepatomegaly, splenomegaly or mass.     Tenderness: There is no abdominal tenderness.  Musculoskeletal:        General: Normal range of motion.     Cervical back: Neck supple.     Right lower leg: No edema.     Left lower leg: No edema.  Lymphadenopathy:     Cervical: No cervical adenopathy.  Skin:    General: Skin is warm and dry.  Neurological:     General: No focal deficit present.     Mental Status: She is alert.  Psychiatric:        Mood and Affect: Mood normal.        Behavior: Behavior normal.    Lab Results  Component Value Date   WBC 12.7 (H) 09/04/2020   HGB 13.6 09/04/2020   HCT 41.8 09/04/2020   PLT 293 09/04/2020   GLUCOSE 101 (H) 09/04/2020   CHOL 133 09/08/2020   TRIG 119.0 09/08/2020   HDL 44.00 09/08/2020   LDLCALC 65 09/08/2020   ALT 21 09/04/2020   AST 12 (L) 09/04/2020   NA 140 09/04/2020   K 4.0 09/04/2020   CL 106 09/04/2020   CREATININE 0.92 09/04/2020   BUN 15 09/04/2020   CO2 28 09/04/2020   TSH 2.47 09/08/2020   HGBA1C 5.8 09/08/2020     Assessment & Plan:   Monique Bryan was seen today for new patient (initial visit) and annual exam.  Diagnoses and all orders for this visit:  Encounter for general adult medical examination with abnormal findings- Exam completed, labs reviewed, vaccines are up-to-date, cancer screenings are up-to-date, patient education material was given. -     Lipid panel; Future -     Lipid panel  Morbid obesity with BMI of 50.0-59.9, adult (HCC)- Labs are negative for secondary causes or complications.  I recommended that she be seen by Gery Pray attrition. -     Thyroid Panel With TSH; Future -     Hemoglobin A1c; Future -     VITAMIN D 25 Hydroxy (Vit-D Deficiency, Fractures); Future -     Amb Ref to Medical Weight Management -     VITAMIN D 25  Hydroxy (Vit-D Deficiency, Fractures) -     Hemoglobin A1c -     Thyroid Panel With TSH  Vitamin D deficiency disease -     Cholecalciferol 1.25 MG (50000 UT) capsule; Take 1 capsule (50,000 Units total) by mouth once a week.  I have discontinued Monique Bryan "Maddy"'s acetaminophen, guaiFENesin-dextromethorphan, ibuprofen, cyclobenzaprine, and predniSONE. I am also having her start on Cholecalciferol. Additionally, I am having her maintain her Skyla.  Meds ordered this encounter  Medications   Cholecalciferol 1.25 MG (50000 UT) capsule    Sig: Take 1 capsule (50,000 Units total) by mouth once a week.    Dispense:  12 capsule    Refill:  1      Follow-up: Return in about 6 months (around 03/10/2021).  Sanda Linger, MD

## 2020-09-08 NOTE — Patient Instructions (Signed)
Health Maintenance, Female Adopting a healthy lifestyle and getting preventive care are important in promoting health and wellness. Ask your health care provider about: The right schedule for you to have regular tests and exams. Things you can do on your own to prevent diseases and keep yourself healthy. What should I know about diet, weight, and exercise? Eat a healthy diet  Eat a diet that includes plenty of vegetables, fruits, low-fat dairy products, and lean protein. Do not eat a lot of foods that are high in solid fats, added sugars, or sodium.  Maintain a healthy weight Body mass index (BMI) is used to identify weight problems. It estimates body fat based on height and weight. Your health care provider can help determineyour BMI and help you achieve or maintain a healthy weight. Get regular exercise Get regular exercise. This is one of the most important things you can do for your health. Most adults should: Exercise for at least 150 minutes each week. The exercise should increase your heart rate and make you sweat (moderate-intensity exercise). Do strengthening exercises at least twice a week. This is in addition to the moderate-intensity exercise. Spend less time sitting. Even light physical activity can be beneficial. Watch cholesterol and blood lipids Have your blood tested for lipids and cholesterol at 30 years of age, then havethis test every 5 years. Have your cholesterol levels checked more often if: Your lipid or cholesterol levels are high. You are older than 30 years of age. You are at high risk for heart disease. What should I know about cancer screening? Depending on your health history and family history, you may need to have cancer screening at various ages. This may include screening for: Breast cancer. Cervical cancer. Colorectal cancer. Skin cancer. Lung cancer. What should I know about heart disease, diabetes, and high blood pressure? Blood pressure and heart  disease High blood pressure causes heart disease and increases the risk of stroke. This is more likely to develop in people who have high blood pressure readings, are of African descent, or are overweight. Have your blood pressure checked: Every 3-5 years if you are 18-39 years of age. Every year if you are 40 years old or older. Diabetes Have regular diabetes screenings. This checks your fasting blood sugar level. Have the screening done: Once every three years after age 40 if you are at a normal weight and have a low risk for diabetes. More often and at a younger age if you are overweight or have a high risk for diabetes. What should I know about preventing infection? Hepatitis B If you have a higher risk for hepatitis B, you should be screened for this virus. Talk with your health care provider to find out if you are at risk forhepatitis B infection. Hepatitis C Testing is recommended for: Everyone born from 1945 through 1965. Anyone with known risk factors for hepatitis C. Sexually transmitted infections (STIs) Get screened for STIs, including gonorrhea and chlamydia, if: You are sexually active and are younger than 30 years of age. You are older than 30 years of age and your health care provider tells you that you are at risk for this type of infection. Your sexual activity has changed since you were last screened, and you are at increased risk for chlamydia or gonorrhea. Ask your health care provider if you are at risk. Ask your health care provider about whether you are at high risk for HIV. Your health care provider may recommend a prescription medicine to help   prevent HIV infection. If you choose to take medicine to prevent HIV, you should first get tested for HIV. You should then be tested every 3 months for as long as you are taking the medicine. Pregnancy If you are about to stop having your period (premenopausal) and you may become pregnant, seek counseling before you get  pregnant. Take 400 to 800 micrograms (mcg) of folic acid every day if you become pregnant. Ask for birth control (contraception) if you want to prevent pregnancy. Osteoporosis and menopause Osteoporosis is a disease in which the bones lose minerals and strength with aging. This can result in bone fractures. If you are 65 years old or older, or if you are at risk for osteoporosis and fractures, ask your health care provider if you should: Be screened for bone loss. Take a calcium or vitamin D supplement to lower your risk of fractures. Be given hormone replacement therapy (HRT) to treat symptoms of menopause. Follow these instructions at home: Lifestyle Do not use any products that contain nicotine or tobacco, such as cigarettes, e-cigarettes, and chewing tobacco. If you need help quitting, ask your health care provider. Do not use street drugs. Do not share needles. Ask your health care provider for help if you need support or information about quitting drugs. Alcohol use Do not drink alcohol if: Your health care provider tells you not to drink. You are pregnant, may be pregnant, or are planning to become pregnant. If you drink alcohol: Limit how much you use to 0-1 drink a day. Limit intake if you are breastfeeding. Be aware of how much alcohol is in your drink. In the [place] one drink equals one 12 oz bottle of beer (355 mL), one 5 oz glass of wine (148 mL), or one 1 oz glass of hard liquor (44 mL). General instructions Schedule regular health, dental, and eye exams. Stay current with your vaccines. Tell your health care provider if: You often feel depressed. You have ever been abused or do not feel safe at home. Summary Adopting a healthy lifestyle and getting preventive care are important in promoting health and wellness. Follow your health care provider's instructions about healthy diet, exercising, and getting tested or screened for diseases. Follow your health care provider's  instructions on monitoring your cholesterol and blood pressure. This information is not intended to replace advice given to you by your health care provider. Make sure you discuss any questions you have with your healthcare provider. Document Revised: 02/28/2018 Document Reviewed: 02/28/2018 Elsevier Patient Education  2022 Elsevier Inc.  

## 2020-09-09 LAB — THYROID PANEL WITH TSH
Free Thyroxine Index: 2.3 (ref 1.4–3.8)
T3 Uptake: 30 % (ref 22–35)
T4, Total: 7.5 ug/dL (ref 5.1–11.9)
TSH: 2.47 mIU/L

## 2020-09-13 ENCOUNTER — Encounter: Payer: Self-pay | Admitting: Internal Medicine

## 2020-09-16 ENCOUNTER — Ambulatory Visit
Admission: EM | Admit: 2020-09-16 | Discharge: 2020-09-16 | Disposition: A | Discharge status: Home / Self Care | Payer: BC Managed Care – PPO | Attending: Emergency Medicine | Admitting: Emergency Medicine

## 2020-09-16 DIAGNOSIS — R1033 Periumbilical pain: Secondary | ICD-10-CM

## 2020-09-16 LAB — POCT URINALYSIS DIP (MANUAL ENTRY)
Bilirubin, UA: NEGATIVE
Blood, UA: NEGATIVE
Glucose, UA: NEGATIVE mg/dL
Ketones, POC UA: NEGATIVE mg/dL
Leukocytes, UA: NEGATIVE
Nitrite, UA: NEGATIVE
Protein Ur, POC: NEGATIVE mg/dL
Spec Grav, UA: 1.025 (ref 1.010–1.025)
Urobilinogen, UA: 0.2 E.U./dL
pH, UA: 7 (ref 5.0–8.0)

## 2020-09-16 LAB — POCT URINE PREGNANCY: Preg Test, Ur: NEGATIVE

## 2020-09-16 MED ORDER — OMEPRAZOLE 20 MG PO CPDR: 20.0000 mg | DELAYED_RELEASE_CAPSULE | Freq: Two times a day (BID) | ORAL | 0 refills | Status: DC

## 2020-09-16 MED ORDER — ALUM & MAG HYDROXIDE-SIMETH 200-200-20 MG/5ML PO SUSP
30.0000 mL | Freq: Once | ORAL | Status: AC
  Administered 2020-09-16: 30 mL via ORAL

## 2020-09-16 MED ORDER — ONDANSETRON 4 MG PO TBDP: 4.0000 mg | ORAL_TABLET | Freq: Three times a day (TID) | ORAL | 0 refills | Status: DC | PRN

## 2020-09-16 MED ORDER — FAMOTIDINE 20 MG PO TABS: 20.0000 mg | ORAL_TABLET | Freq: Two times a day (BID) | ORAL | 0 refills | Status: DC

## 2020-09-16 MED ORDER — LIDOCAINE VISCOUS HCL 2 % MT SOLN
15.0000 mL | Freq: Once | OROMUCOSAL | Status: AC
  Administered 2020-09-16: 15 mL via ORAL

## 2020-09-16 NOTE — ED Triage Notes (Signed)
Pt c/o upper abdominal cramping, lower/center back pain with nausea since yesterday. Denies urinary sx's.

## 2020-09-16 NOTE — ED Provider Notes (Signed)
[facility]    CSN: [id number] Arrival date & time: 09/16/20  1810      History   Chief Complaint Chief Complaint  Patient presents with   Abdominal Pain    HPI Monique Bryan is a 30 y.o. female history of PCOS presenting today for evaluation of abdominal pain.  Reports that over the past 1 to 2 days she has had abdominal discomfort located in her mid upper abdomen.  Associated nausea, denies vomiting.  Reports appetite at baseline and has not seen any significant changes with oral intake.  Reports bowels normal, slightly looser than normal, denies any hematochezia/melena.  Denies urinary symptoms.  Has had some associated back pain as well as chills.  Denies history of GI problems or prior abdominal surgeries.  Was seen in the emergency room approximately 1.5 weeks ago for back pain/groin pain and have blood work which was unremarkable.  Describes pain as a cramping sensation as well as slightly like indigestion.  HPI  Past Medical History:  Diagnosis Date   Medical history non-contributory    Obesity    PCOS (polycystic ovarian syndrome)     Patient Active Problem List   Diagnosis Date Noted   Encounter for general adult medical examination with abnormal findings 09/08/2020   Morbid obesity with BMI of 50.0-59.9, adult (HCC) 09/08/2020   Vitamin D deficiency disease 09/08/2020   Obesity 01/11/2019   Depression 01/11/2019   PCOS (polycystic ovarian syndrome) 01/11/2019   PIH (pregnancy induced hypertension), antepartum 03/26/2018    Past Surgical History:  Procedure Laterality Date   DILATATION & CURETTAGE/HYSTEROSCOPY WITH MYOSURE N/A 01/21/2019   Procedure: DILATATION & CURETTAGE/HYSTEROSCOPY WITH MYOSURE;  Surgeon: Ranae Pila, MD;  Location: [facility] OR;  Service: Gynecology;  Laterality: N/A;   INTRAUTERINE DEVICE (IUD) INSERTION N/A 01/21/2019   Procedure: INTRAUTERINE DEVICE (IUD) INSERTION;  Surgeon: Ranae Pila, MD;  Location: [facility] OR;   Service: Gynecology;  Laterality: N/A;  Mirena    TONSILLECTOMY      OB History     Gravida  1   Para  1   Term  1   Preterm      AB      Living  1      SAB      IAB      Ectopic      Multiple  0   Live Births  1            Home Medications    Prior to Admission medications   Medication Sig Start Date End Date Taking? Authorizing Provider  famotidine (PEPCID) 20 MG tablet Take 1 tablet (20 mg total) by mouth 2 (two) times daily. 09/16/20  Yes Ziggy Reveles C, PA-C  omeprazole (PRILOSEC) 20 MG capsule Take 1 capsule (20 mg total) by mouth 2 (two) times daily before a meal. 09/16/20  Yes Aashvi Rezabek C, PA-C  ondansetron (ZOFRAN ODT) 4 MG disintegrating tablet Take 1 tablet (4 mg total) by mouth every 8 (eight) hours as needed for nausea or vomiting. 09/16/20  Yes Deasha Clendenin C, PA-C  Cholecalciferol 1.25 MG (50000 UT) capsule Take 1 capsule (50,000 Units total) by mouth once a week. 09/08/20   Etta Grandchild, MD  Levonorgestrel [facility]) 13.5 MG IUD Placed 2020 Patient not taking: Reported on 09/08/2020    [provider]    Family History Family History  Problem Relation Age of Onset   Sickle cell trait Mother     Social History  Social History   Tobacco Use   Smoking status: Never   Smokeless tobacco: Never  Vaping Use   Vaping Use: Never used  Substance Use Topics   Alcohol use: No   Drug use: Not Currently    Types: Marijuana     Allergies   Patient has no known allergies.   Review of Systems Review of Systems  Constitutional:  Positive for chills. Negative for fever.  Respiratory:  Negative for shortness of breath.   Cardiovascular:  Negative for chest pain.  Gastrointestinal:  Positive for abdominal pain and nausea. Negative for diarrhea and vomiting.  Genitourinary:  Negative for dysuria, flank pain, genital sores, hematuria, menstrual problem, vaginal bleeding, vaginal discharge and vaginal pain.  Musculoskeletal:   Positive for back pain.  Skin:  Negative for rash.  Neurological:  Negative for dizziness, light-headedness and headaches.    Physical Exam Triage Vital Signs ED Triage Vitals [09/16/20 1932]  Enc Vitals Group     BP (!) 161/94     Pulse Rate 80     Resp 18     Temp 98.6 F (37 C)     Temp Source Oral     SpO2 97 %     Weight      Height      Head Circumference      Peak Flow      Pain Score 8     Pain Loc      Pain Edu?      Excl. in GC?    No data found.  Updated Vital Signs BP (!) 161/94 (BP Location: Left Arm)   Pulse 80   Temp 98.6 F (37 C) (Oral)   Resp 18   SpO2 97%   Visual Acuity Right Eye Distance:   Left Eye Distance:   Bilateral Distance:    Right Eye Near:   Left Eye Near:    Bilateral Near:     Physical Exam Vitals and nursing note reviewed.  Constitutional:      Appearance: She is well-developed.     Comments: No acute distress  HENT:     Head: Normocephalic and atraumatic.     Nose: Nose normal.  Eyes:     Conjunctiva/sclera: Conjunctivae normal.  Cardiovascular:     Rate and Rhythm: Normal rate and regular rhythm.  Pulmonary:     Effort: Pulmonary effort is normal. No respiratory distress.     Comments: Breathing comfortably at rest, CTABL, no wheezing, rales or other adventitious sounds auscultated  Abdominal:     General: There is no distension.     Comments: Soft, nondistended, nontender to palpation of bilateral lower quadrants, tenderness to palpation in mid upper abdomen, nontender to epigastrium, and nontender in right upper quadrant, negative Murphy's, negative McBurney's, negative Rovsing  Musculoskeletal:        General: Normal range of motion.     Cervical back: Neck supple.  Skin:    General: Skin is warm and dry.  Neurological:     Mental Status: She is alert and oriented to person, place, and time.     UC Treatments / Results  Labs (all labs ordered are listed, but only abnormal results are displayed) Labs  Reviewed  POCT URINALYSIS DIP (MANUAL ENTRY)  POCT URINE PREGNANCY    EKG   Radiology No results found.  Procedures Procedures (including critical care time)  Medications Ordered in UC Medications  alum & mag hydroxide-simeth (MAALOX/MYLANTA) 200-200-20 MG/5ML suspension 30 mL (30 mLs  Oral Given 09/16/20 2000)    And  lidocaine (XYLOCAINE) 2 % viscous mouth solution 15 mL (15 mLs Oral Given 09/16/20 2000)    Initial Impression / Assessment and Plan / UC Course  I have reviewed the triage vital signs and the nursing notes.  Pertinent labs & imaging results that were available during my care of the patient were reviewed by me and considered in my medical decision making (see chart for details).     UA unremarkable, GI cocktail provided with some improvement and discomfort in abdomen, will proceed and treat for underlying GERD/gastritis and provide omeprazole and Pepcid.  Zofran as needed for nausea and monitoring for resolution of symptoms.  Advised patient to follow-up if symptoms continuing changing or worsening.  Discussed strict return precautions. Patient verbalized understanding and is agreeable with plan.  Final Clinical Impressions(s) / UC Diagnoses   Final diagnoses:  Periumbilical abdominal pain     Discharge Instructions      Begin omeprazole/Prilosec twice daily over the next 2 weeks for acid prevention Supplement with famotidine/Pepcid or over-the-counter Maalox max for more immediate relief of indigestion Zofran dissolved in mouth as needed for nausea Please continue to monitor pain, follow-up if not improving or worsening     ED Prescriptions     Medication Sig Dispense Auth. Provider   omeprazole (PRILOSEC) 20 MG capsule Take 1 capsule (20 mg total) by mouth 2 (two) times daily before a meal. 30 capsule Olanda Downie C, PA-C   famotidine (PEPCID) 20 MG tablet Take 1 tablet (20 mg total) by mouth 2 (two) times daily. 30 tablet Aaima Gaddie C, PA-C    ondansetron (ZOFRAN ODT) 4 MG disintegrating tablet Take 1 tablet (4 mg total) by mouth every 8 (eight) hours as needed for nausea or vomiting. 20 tablet Yuriy Cui, [place] C, PA-C      PDMP not reviewed this encounter.   Lew Dawes, [place] 09/17/20 [phone]

## 2020-09-16 NOTE — Discharge Instructions (Addendum)
Begin omeprazole/Prilosec twice daily over the next 2 weeks for acid prevention Supplement with famotidine/Pepcid or over-the-counter Maalox max for more immediate relief of indigestion Zofran dissolved in mouth as needed for nausea Please continue to monitor pain, follow-up if not improving or worsening

## 2020-09-17 DIAGNOSIS — R102 Pelvic and perineal pain: Secondary | ICD-10-CM | POA: Diagnosis not present

## 2020-09-17 DIAGNOSIS — Z30431 Encounter for routine checking of intrauterine contraceptive device: Secondary | ICD-10-CM | POA: Diagnosis not present

## 2020-09-17 DIAGNOSIS — R1010 Upper abdominal pain, unspecified: Secondary | ICD-10-CM | POA: Diagnosis not present

## 2020-09-20 IMAGING — US US MFM FETAL BPP W/O NON-STRESS
1 series · 15 of 28 positions shown · non-contrast
Comparison: none

[Series 1: us mfm fetal bpp w/o non-stress · 29 acquisitions, 15 frames shown]
[im 1/29]
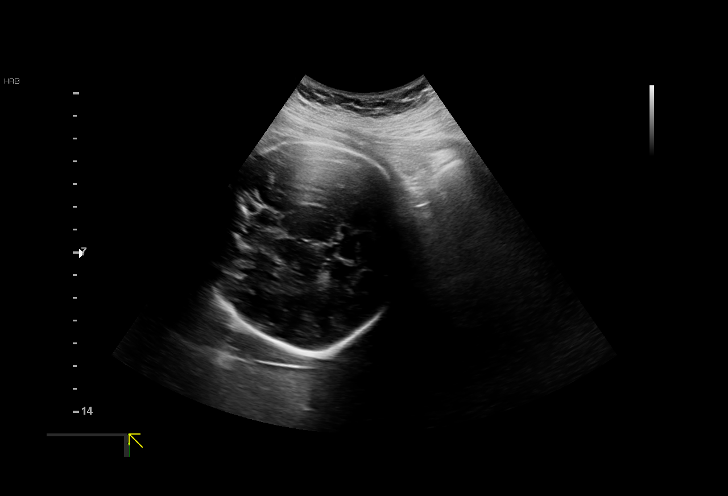
[im 3/29]
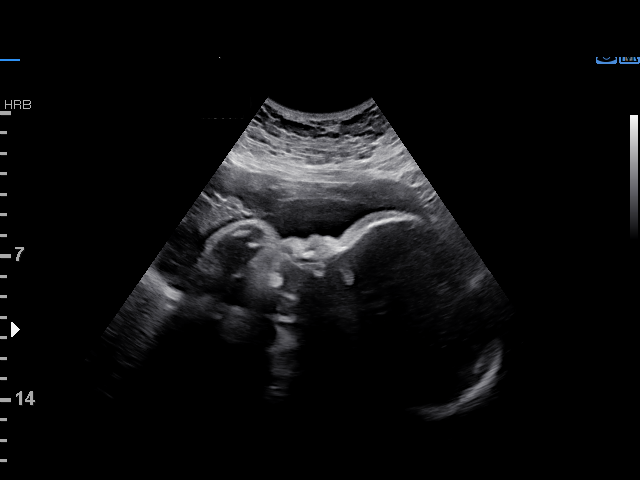
[im 5/29]
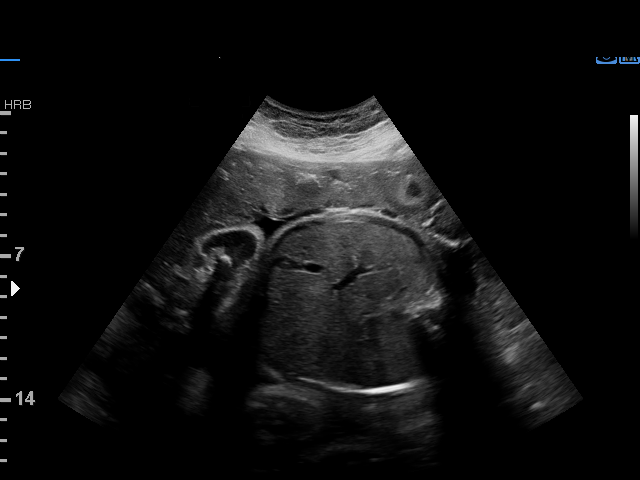
[im 7/29]
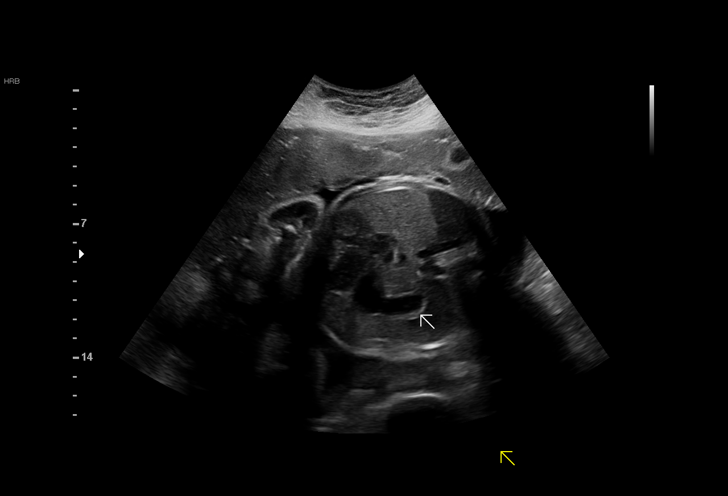
[im 9/29]
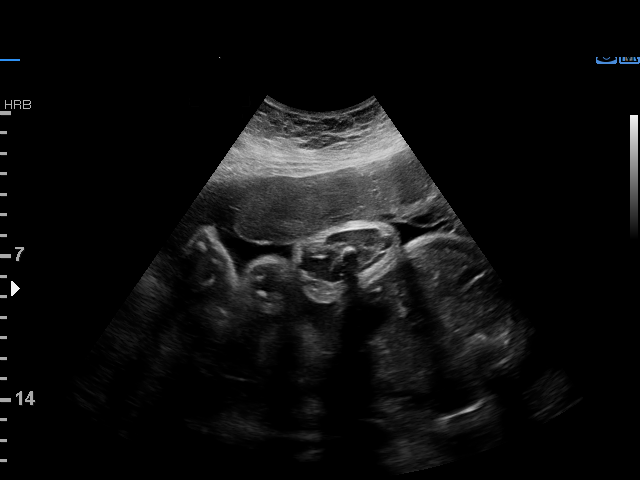
[im 11/29]
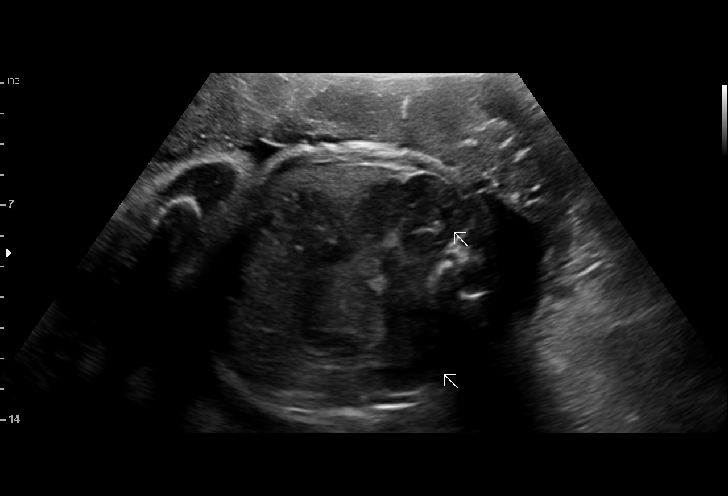
[im 13/29]
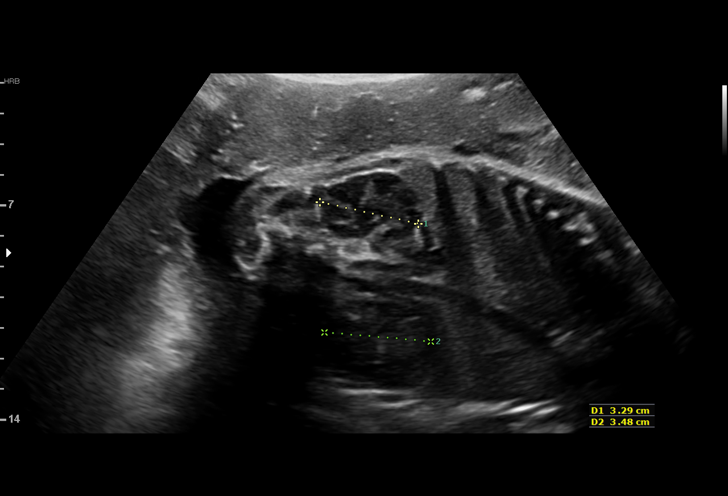
[im 15/29]
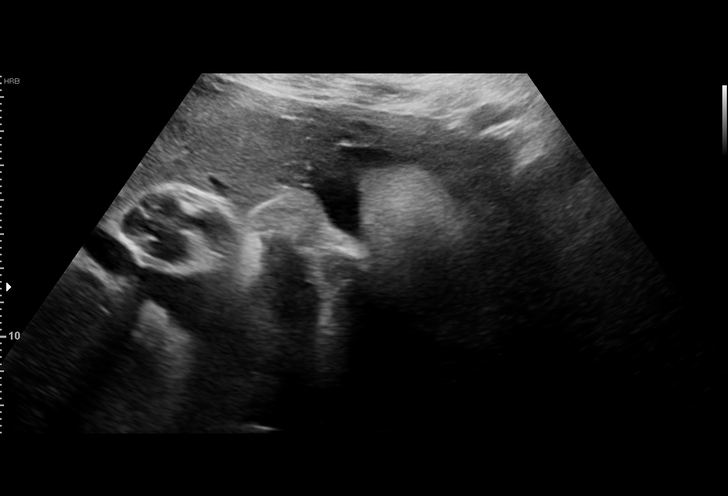
[im 16/29]
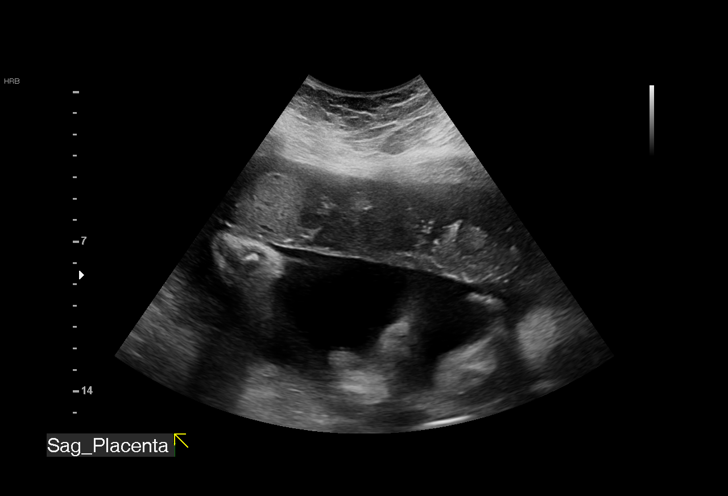
[im 18/29]
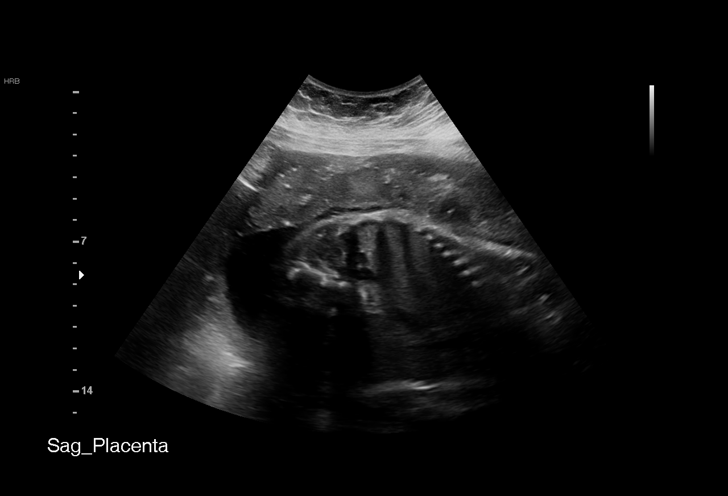
[im 20/29]
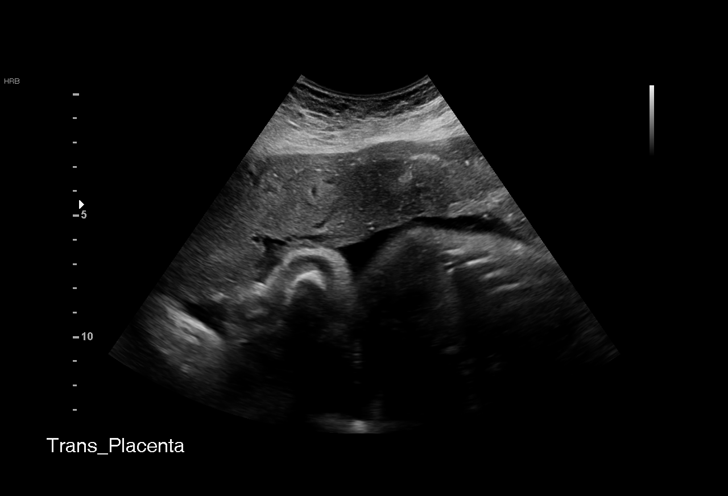
[im 22/29]
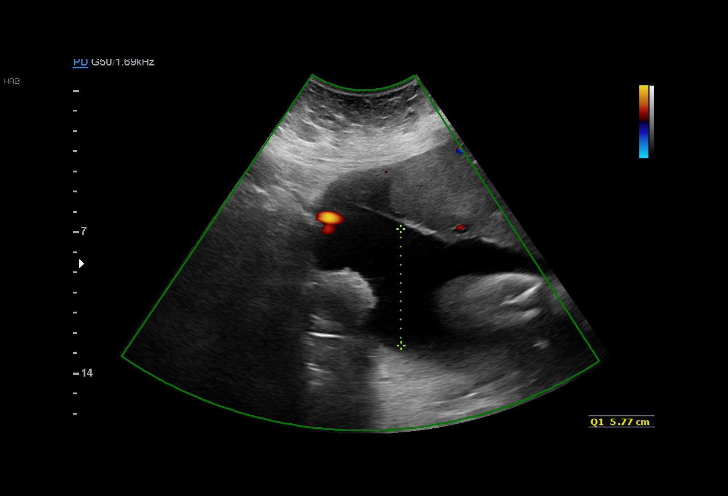
[im 24/29]
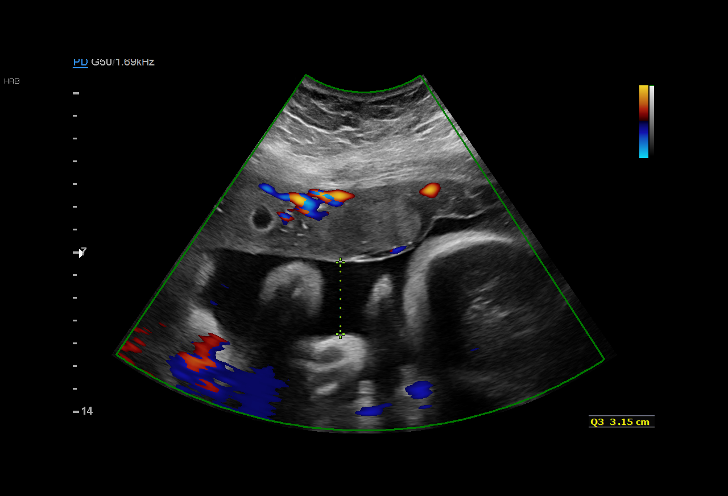
[im 26/29]
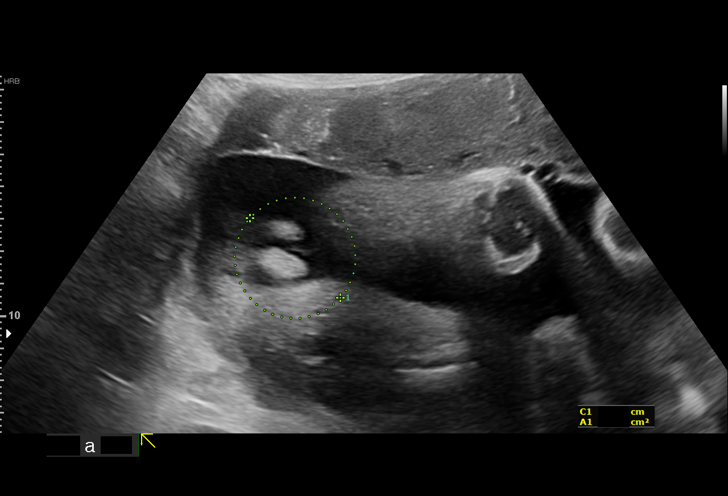
[im 29/29]
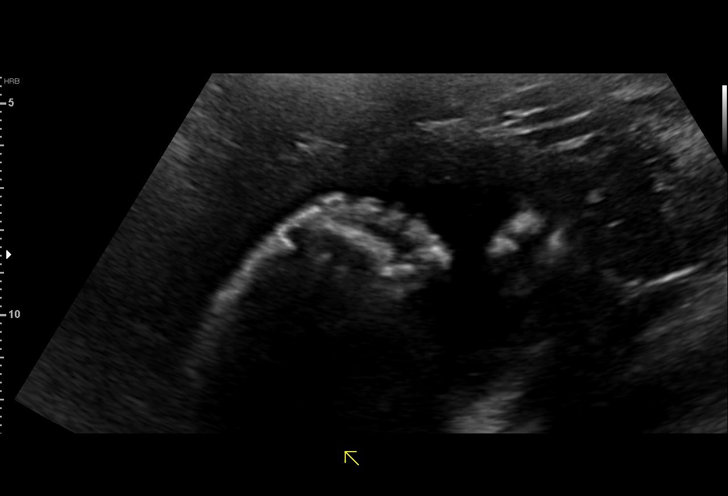

[15 of 28 positions shown; findings below may reference images not displayed]

Obstetrics &
                                                             Gynecology
                                                             [address]
                                                             [place]
                   [facility]

 ----------------------------------------------------------------------

 ----------------------------------------------------------------------
Indications

  Obesity complicating pregnancy, third
  trimester
  34 weeks gestation of pregnancy
 ----------------------------------------------------------------------
Vital Signs

 BMI:
Fetal Evaluation

 Num Of Fetuses:          1
 Fetal Heart Rate(bpm):   133
 Cardiac Activity:        Observed
 Presentation:            Cephalic
 Placenta:                Anterior

 Amniotic Fluid
 AFI FV:      Within normal limits

 AFI Sum(cm)     %Tile       Largest Pocket(cm)
 15.26           55

 RUQ(cm)       RLQ(cm)       LUQ(cm)        LLQ(cm)

Biophysical Evaluation

 Amniotic F.V:   Within normal limits       F. Tone:         Observed
 F. Movement:    Observed                   Score:           [DATE]
 F. Breathing:   Observed
OB History

 Gravidity:    1
Gestational Age

 LMP:           34w 2d        Date:  06/27/17                 EDD:   04/03/18
 Best:          34w 2d     Det. By:  LMP  (06/27/17)          EDD:   04/03/18
Anatomy

 Thoracic:              Appears normal         Abdomen:                Appears normal
 Diaphragm:             Appears normal         Kidneys:                Appear normal
 Stomach:               Appears normal, left   Bladder:                Appears normal
                        sided
Cervix Uterus Adnexa

 Cervix
 Not visualized (advanced GA >17wks)
Impression

 Biophysical profile [DATE]
Recommendations

 Follow up in as clinically indicated.

## 2020-09-24 DIAGNOSIS — N83291 Other ovarian cyst, right side: Secondary | ICD-10-CM | POA: Diagnosis not present

## 2020-09-24 DIAGNOSIS — R102 Pelvic and perineal pain: Secondary | ICD-10-CM | POA: Diagnosis not present

## 2020-09-24 DIAGNOSIS — Z30431 Encounter for routine checking of intrauterine contraceptive device: Secondary | ICD-10-CM | POA: Diagnosis not present

## 2020-11-26 DIAGNOSIS — Z0289 Encounter for other administrative examinations: Secondary | ICD-10-CM

## 2020-12-01 DIAGNOSIS — Z309 Encounter for contraceptive management, unspecified: Secondary | ICD-10-CM | POA: Diagnosis not present

## 2020-12-01 DIAGNOSIS — Z113 Encounter for screening for infections with a predominantly sexual mode of transmission: Secondary | ICD-10-CM | POA: Diagnosis not present

## 2020-12-01 DIAGNOSIS — Z6841 Body Mass Index (BMI) 40.0 and over, adult: Secondary | ICD-10-CM | POA: Diagnosis not present

## 2020-12-01 DIAGNOSIS — E669 Obesity, unspecified: Secondary | ICD-10-CM | POA: Diagnosis not present

## 2020-12-01 DIAGNOSIS — Z1329 Encounter for screening for other suspected endocrine disorder: Secondary | ICD-10-CM | POA: Diagnosis not present

## 2020-12-01 DIAGNOSIS — Z1321 Encounter for screening for nutritional disorder: Secondary | ICD-10-CM | POA: Diagnosis not present

## 2020-12-01 DIAGNOSIS — Z01419 Encounter for gynecological examination (general) (routine) without abnormal findings: Secondary | ICD-10-CM | POA: Diagnosis not present

## 2020-12-01 DIAGNOSIS — N760 Acute vaginitis: Secondary | ICD-10-CM | POA: Diagnosis not present

## 2020-12-01 DIAGNOSIS — Z131 Encounter for screening for diabetes mellitus: Secondary | ICD-10-CM | POA: Diagnosis not present

## 2020-12-02 ENCOUNTER — Ambulatory Visit (INDEPENDENT_AMBULATORY_CARE_PROVIDER_SITE_OTHER): Payer: BC Managed Care – PPO | Admitting: Family Medicine

## 2020-12-02 DIAGNOSIS — Z1331 Encounter for screening for depression: Secondary | ICD-10-CM

## 2020-12-02 DIAGNOSIS — R0602 Shortness of breath: Secondary | ICD-10-CM

## 2020-12-02 DIAGNOSIS — R5383 Other fatigue: Secondary | ICD-10-CM

## 2020-12-11 DIAGNOSIS — Z13220 Encounter for screening for lipoid disorders: Secondary | ICD-10-CM | POA: Diagnosis not present

## 2020-12-16 ENCOUNTER — Ambulatory Visit (INDEPENDENT_AMBULATORY_CARE_PROVIDER_SITE_OTHER): Payer: BC Managed Care – PPO | Admitting: Family Medicine

## 2020-12-24 ENCOUNTER — Ambulatory Visit (INDEPENDENT_AMBULATORY_CARE_PROVIDER_SITE_OTHER): Payer: BC Managed Care – PPO | Admitting: Family Medicine

## 2020-12-24 ENCOUNTER — Encounter (INDEPENDENT_AMBULATORY_CARE_PROVIDER_SITE_OTHER): Payer: Self-pay | Admitting: Family Medicine

## 2020-12-24 ENCOUNTER — Other Ambulatory Visit: Payer: Self-pay

## 2020-12-24 VITALS — BP 115/56 | HR 68 | Temp 98.0°F | Ht 69.0 in | Wt 349.0 lb

## 2020-12-24 DIAGNOSIS — Z6841 Body Mass Index (BMI) 40.0 and over, adult: Secondary | ICD-10-CM

## 2020-12-24 DIAGNOSIS — R5383 Other fatigue: Secondary | ICD-10-CM | POA: Diagnosis not present

## 2020-12-24 DIAGNOSIS — R0602 Shortness of breath: Secondary | ICD-10-CM

## 2020-12-24 DIAGNOSIS — R739 Hyperglycemia, unspecified: Secondary | ICD-10-CM | POA: Diagnosis not present

## 2020-12-24 DIAGNOSIS — E559 Vitamin D deficiency, unspecified: Secondary | ICD-10-CM

## 2020-12-24 DIAGNOSIS — Z9189 Other specified personal risk factors, not elsewhere classified: Secondary | ICD-10-CM

## 2020-12-24 DIAGNOSIS — Z1331 Encounter for screening for depression: Secondary | ICD-10-CM

## 2020-12-24 DIAGNOSIS — R6889 Other general symptoms and signs: Secondary | ICD-10-CM | POA: Diagnosis not present

## 2020-12-24 MED ORDER — VITAMIN D (ERGOCALCIFEROL) 1.25 MG (50000 UNIT) PO CAPS: 50000.0000 [IU] | ORAL_CAPSULE | ORAL | 0 refills | Status: DC

## 2020-12-25 LAB — CBC WITH DIFFERENTIAL
Basophils Absolute: 0.1 10*3/uL (ref 0.0–0.2)
Basos: 1 %
EOS (ABSOLUTE): 0.2 10*3/uL (ref 0.0–0.4)
Eos: 2 %
Hematocrit: 42.9 % (ref 34.0–46.6)
Hemoglobin: 13.8 g/dL (ref 11.1–15.9)
Immature Grans (Abs): 0 10*3/uL (ref 0.0–0.1)
Immature Granulocytes: 0 %
Lymphocytes Absolute: 3.4 10*3/uL — ABNORMAL HIGH (ref 0.7–3.1)
Lymphs: 34 %
MCH: 28.4 pg (ref 26.6–33.0)
MCHC: 32.2 g/dL (ref 31.5–35.7)
MCV: 88 fL (ref 79–97)
Monocytes Absolute: 0.6 10*3/uL (ref 0.1–0.9)
Monocytes: 6 %
Neutrophils Absolute: 5.6 10*3/uL (ref 1.4–7.0)
Neutrophils: 57 %
RBC: 4.86 x10E6/uL (ref 3.77–5.28)
RDW: 13.1 % (ref 11.7–15.4)
WBC: 9.8 10*3/uL (ref 3.4–10.8)

## 2020-12-25 LAB — COMPREHENSIVE METABOLIC PANEL
ALT: 19 IU/L (ref 0–32)
AST: 14 IU/L (ref 0–40)
Albumin/Globulin Ratio: 1.3 (ref 1.2–2.2)
Albumin: 4.3 g/dL (ref 3.9–5.0)
Alkaline Phosphatase: 95 IU/L (ref 44–121)
BUN/Creatinine Ratio: 10 (ref 9–23)
BUN: 8 mg/dL (ref 6–20)
Bilirubin Total: 0.4 mg/dL (ref 0.0–1.2)
CO2: 24 mmol/L (ref 20–29)
Calcium: 9.4 mg/dL (ref 8.7–10.2)
Chloride: 101 mmol/L (ref 96–106)
Creatinine, Ser: 0.79 mg/dL (ref 0.57–1.00)
Globulin, Total: 3.2 g/dL (ref 1.5–4.5)
Glucose: 91 mg/dL (ref 70–99)
Potassium: 4.8 mmol/L (ref 3.5–5.2)
Sodium: 140 mmol/L (ref 134–144)
Total Protein: 7.5 g/dL (ref 6.0–8.5)
eGFR: 103 mL/min/{1.73_m2} (ref >59)

## 2020-12-25 LAB — INSULIN, RANDOM: INSULIN: 39.3 u[IU]/mL — ABNORMAL HIGH (ref 2.6–24.9)

## 2020-12-25 LAB — HEMOGLOBIN A1C
Est. average glucose Bld gHb Est-mCnc: 120 mg/dL
Hgb A1c MFr Bld: 5.8 % — ABNORMAL HIGH (ref 4.8–5.6)

## 2020-12-25 LAB — VITAMIN B12: Vitamin B-12: 223 pg/mL — ABNORMAL LOW (ref 232–1245)

## 2020-12-25 LAB — FOLATE: Folate: 14.5 ng/mL (ref >3.0)

## 2020-12-28 NOTE — Progress Notes (Signed)
Chief Complaint:   OBESITY Monique Bryan (MR# [id number]) is a 30 y.o. female who presents for evaluation and treatment of obesity and related comorbidities. Current BMI is Body mass index is 51.54 kg/m. Monique Bryan has been struggling with her weight for many years and has been unsuccessful in either losing weight, maintaining weight loss, or reaching her healthy weight goal.  Monique Bryan is considering weight loss surgery, but she would like to work on diet and exercise first.  Monique Bryan is currently in the action stage of change and ready to dedicate time achieving and maintaining a healthier weight. Monique Bryan is interested in becoming our patient and working on intensive lifestyle modifications including (but not limited to) diet and exercise for weight loss.  Monique Bryan's habits were reviewed today and are as follows: Her family eats meals together, her desired weight loss is 149 lbs, she has been heavy most of her life, she started gaining weight when COVID-19 hit, her heaviest weight ever was 360 pounds, she has significant food cravings issues, she snacks frequently in the evenings, she skips meals frequently, she is frequently drinking liquids with calories, she frequently makes poor food choices, she frequently eats larger portions than normal, and she struggles with emotional eating.  Depression Screen Monique Bryan's Food and Mood (modified PHQ-9) score was 12.  Depression screen PHQ 2/9 12/24/2020  Decreased Interest 1  Down, Depressed, Hopeless 2  PHQ - 2 Score 3  Altered sleeping 1  Tired, decreased energy 3  Change in appetite 3  Feeling bad or failure about yourself  0  Trouble concentrating 2  Moving slowly or fidgety/restless 0  Suicidal thoughts 0  PHQ-9 Score 12  Difficult doing work/chores Not difficult at all   Subjective:   1. Other fatigue Monique Bryan admits to daytime somnolence and admits to waking up still tired. Patent has a history of symptoms of daytime fatigue and  morning fatigue. Monique Bryan generally gets 6 hours of sleep per night, and states that she has difficulty falling asleep. Snoring is present. Apneic episodes are not present. Epworth Sleepiness Score is 3.  2. SOB (shortness of breath) on exertion Monique Bryan notes increasing shortness of breath with exercising and seems to be worsening over time with weight gain. She notes getting out of breath sooner with activity than she used to. This has not gotten worse recently. Monique Bryan denies shortness of breath at rest or orthopnea.  3. Vitamin D deficiency disease Monique Bryan's recent Vit D level was 25, and she is not on Vit D. I discussed labs with the patient today.  4. Hyperglycemia Monique Bryan also has a history of polycystic ovarian syndrome. Her recent A1c was 5.8. I discussed labs with the patient today.  5. At risk for heart disease Monique Bryan is at a higher than average risk for cardiovascular disease due to obesity.   Assessment/Plan:   1. Other fatigue Monique Bryan does feel that her weight is causing her energy to be lower than it should be. Fatigue may be related to obesity, depression or many other causes. Labs will be ordered, and in the meanwhile, Monique Bryan will focus on self care including making healthy food choices, increasing physical activity and focusing on stress reduction.  - Vitamin B12 - Folate - CBC With Differential - EKG 12-Lead  2. SOB (shortness of breath) on exertion Monique Bryan does feel that she gets out of breath more easily that she used to when she exercises. Monique Bryan's shortness of breath appears to be obesity related and exercise induced. She  has agreed to work on weight loss and gradually increase exercise to treat her exercise induced shortness of breath. Will continue to monitor closely.  3. Vitamin D deficiency disease Low Vitamin D level contributes to fatigue and are associated with obesity, breast, and colon cancer. Monique Bryan agreed to start prescription Vitamin D 50,000 IU every  week with no refills. She will follow-up for routine testing of Vitamin D, at least 2-3 times per year to avoid over-replacement.  - Vitamin D, Ergocalciferol, (DRISDOL) 1.25 MG (50000 UNIT) CAPS capsule; Take 1 capsule (50,000 Units total) by mouth every 7 (seven) days.  Dispense: 4 capsule; Refill: 0  4. Hyperglycemia Fasting labs will be obtained today, and results with be discussed with Monique Bryan in 2 weeks at her follow up visit. In the meanwhile Monique Bryan agreed to start her Category 3 plan and will work on weight loss efforts.  - Insulin, random - Comprehensive metabolic panel - Hemoglobin A1c  5. At risk for heart disease Monique Bryan was given approximately 30 minutes of coronary artery disease prevention counseling today. She is 30 y.o. female and has risk factors for heart disease including obesity. We discussed intensive lifestyle modifications today with an emphasis on specific weight loss instructions and strategies.   Repetitive spaced learning was employed today to elicit superior memory formation and behavioral change.  6. Screening for depression Monique Bryan had a positive depression screening. Depression is commonly associated with obesity and often results in emotional eating behaviors. We will monitor this closely and work on CBT to help improve the non-hunger eating patterns. Referral to Psychology may be required if no improvement is seen as she continues in our clinic.  7. Obesity with current BMI 51.6 Monique Bryan is currently in the action stage of change and her goal is to continue with weight loss efforts. I recommend Monique Bryan begin the structured treatment plan as follows:  She has agreed to the Category 3 Plan.  Exercise goals: No exercise has been prescribed for now, while we concentrate on nutritional changes.  Behavioral modification strategies: no skipping meals.  She was informed of the importance of frequent follow-up visits to maximize her success with intensive  lifestyle modifications for her multiple health conditions. She was informed we would discuss her lab results at her next visit unless there is a critical issue that needs to be addressed sooner. Monique Bryan agreed to keep her next visit at the agreed upon time to discuss these results.  Objective:   Blood pressure (!) 115/56, pulse 68, temperature 98 F (36.7 C), height 5\' 9"  (1.753 m), weight (!) 349 lb (158.3 kg), SpO2 97 %, unknown if currently breastfeeding. Body mass index is 51.54 kg/m.  EKG: Normal sinus rhythm, rate 70 BPM.  Indirect Calorimeter completed today shows a VO2 of 325 and a REE of 2246.  Her calculated basal metabolic rate is thus her basal metabolic rate is worse than expected.  General: Cooperative, alert, well developed, in no acute distress. HEENT: Conjunctivae and lids unremarkable. Cardiovascular: Regular rhythm.  Lungs: Normal work of breathing. Neurologic: No focal deficits.   Lab Results  Component Value Date   CREATININE 0.79 12/24/2020   BUN 8 12/24/2020   NA 140 12/24/2020   K 4.8 12/24/2020   CL 101 12/24/2020   CO2 24 12/24/2020   Lab Results  Component Value Date   ALT 19 12/24/2020   AST 14 12/24/2020   ALKPHOS 95 12/24/2020   BILITOT 0.4 12/24/2020   Lab Results  Component Value Date  HGBA1C 5.8 (H) 12/24/2020   HGBA1C 5.8 09/08/2020   Lab Results  Component Value Date   INSULIN 39.3 (H) 12/24/2020   Lab Results  Component Value Date   TSH 2.47 09/08/2020   Lab Results  Component Value Date   CHOL 133 09/08/2020   HDL 44.00 09/08/2020   LDLCALC 65 09/08/2020   TRIG 119.0 09/08/2020   CHOLHDL 3 09/08/2020   Lab Results  Component Value Date   WBC 9.8 12/24/2020   HGB 13.8 12/24/2020   HCT 42.9 12/24/2020   MCV 88 12/24/2020   PLT 293 09/04/2020   No results found for: IRON, TIBC, FERRITIN  Attestation Statements:   Reviewed by clinician on day of visit: allergies, medications, problem list, medical history,  surgical history, family history, social history, and previous encounter notes.   I, Burt Knack, am acting as transcriptionist for Quillian Quince, MD.  I have reviewed the above documentation for accuracy and completeness, and I agree with the above. - Quillian Quince, MD

## 2021-01-07 ENCOUNTER — Encounter (INDEPENDENT_AMBULATORY_CARE_PROVIDER_SITE_OTHER): Payer: Self-pay | Admitting: Family Medicine

## 2021-01-07 ENCOUNTER — Other Ambulatory Visit: Payer: Self-pay

## 2021-01-07 ENCOUNTER — Ambulatory Visit (INDEPENDENT_AMBULATORY_CARE_PROVIDER_SITE_OTHER): Payer: BC Managed Care – PPO | Admitting: Family Medicine

## 2021-01-07 VITALS — BP 139/66 | HR 75 | Temp 98.0°F | Ht 69.0 in | Wt 347.0 lb

## 2021-01-07 DIAGNOSIS — Z6841 Body Mass Index (BMI) 40.0 and over, adult: Secondary | ICD-10-CM

## 2021-01-07 DIAGNOSIS — E559 Vitamin D deficiency, unspecified: Secondary | ICD-10-CM | POA: Diagnosis not present

## 2021-01-07 DIAGNOSIS — Z9189 Other specified personal risk factors, not elsewhere classified: Secondary | ICD-10-CM | POA: Diagnosis not present

## 2021-01-07 DIAGNOSIS — E538 Deficiency of other specified B group vitamins: Secondary | ICD-10-CM

## 2021-01-07 DIAGNOSIS — R7303 Prediabetes: Secondary | ICD-10-CM | POA: Diagnosis not present

## 2021-01-07 MED ORDER — VITAMIN D (ERGOCALCIFEROL) 1.25 MG (50000 UNIT) PO CAPS: 50000.0000 [IU] | ORAL_CAPSULE | ORAL | 0 refills | Status: DC

## 2021-01-07 MED ORDER — METFORMIN HCL 500 MG PO TABS: 500.0000 mg | ORAL_TABLET | Freq: Every day | ORAL | 0 refills | Status: DC

## 2021-01-07 NOTE — Progress Notes (Signed)
Chief Complaint:   OBESITY Monique Bryan is here to discuss her progress with her obesity treatment plan along with follow-up of her obesity related diagnoses. Monique Bryan is on the Category 3 Plan and states she is following her eating plan approximately 90% of the time. Monique Bryan states she is doing 0 minutes 0 times per week.  Today's visit was #: 2 Starting weight: 349 lbs Starting date: 12/24/2020 Today's weight: 347 lbs Today's date: 01/07/2021 Total lbs lost to date: 2 Total lbs lost since last in-office visit: 2  Interim History: Monique Bryan worked hard to follow her plan. She struggled with increased hunger most of the time. She is open to looking at other eating plan options.  Subjective:   1. Pre-diabetes Adam has a new diagnosis of pre-diabetes. Last A1c and fasting insulin are elevated. She notes significant polyphagia. I discussed labs with the patient today.  2. B12 deficiency Monique Bryan has a new diagnosis of Vitamin B12. Her B12 level is low, and she has no history of anemia or gastric surgery. I discussed labs with the patient today.  3. Vitamin D deficiency disease Monique Bryan is on Vit D prescription, and she denies nausea, vomiting, or muscle weakness. I discussed labs with patient today.  4. At risk for diabetes mellitus Monique Bryan is at higher than average risk for developing diabetes due to obesity.   Assessment/Plan:   1. Pre-diabetes Monique Bryan agreed to start metformin 500 mg q AM with food. She will continue with diet and exercise to help decrease the risk of diabetes.   - metFORMIN (GLUCOPHAGE) 500 MG tablet; Take 1 tablet (500 mg total) by mouth daily with breakfast.  Dispense: 30 tablet; Refill: 0  2. B12 deficiency The diagnosis was reviewed with the patient. Monique Bryan agreed to start B12 OTC 1,000 mcg daily and Orders and follow up as documented in patient record.  3. Vitamin D deficiency disease Low Vitamin D level contributes to fatigue and are associated with  obesity, breast, and colon cancer. We will refill prescription Vitamin D for 1 month. Monique Bryan will follow-up for routine testing of Vitamin D, at least 2-3 times per year to avoid over-replacement.  - Vitamin D, Ergocalciferol, (DRISDOL) 1.25 MG (50000 UNIT) CAPS capsule; Take 1 capsule (50,000 Units total) by mouth every 7 (seven) days.  Dispense: 4 capsule; Refill: 0  4. At risk for diabetes mellitus Monique Bryan was given approximately 30 minutes of diabetes education and counseling today. We discussed intensive lifestyle modifications today with an emphasis on weight loss as well as increasing exercise and decreasing simple carbohydrates in her diet. We also reviewed medication options with an emphasis on risk versus benefit of those discussed.   Repetitive spaced learning was employed today to elicit superior memory formation and behavioral change.  5. Obesity with current BMI 51.3 Monique Bryan is currently in the action stage of change. As such, her goal is to continue with weight loss efforts. She has agreed to keeping a food journal and adhering to recommended goals of 1400-1700 calories and 90+ grams of protein daily.   Behavioral modification strategies: increasing lean protein intake.  Monique Bryan has agreed to follow-up with our clinic in 2 weeks. She was informed of the importance of frequent follow-up visits to maximize her success with intensive lifestyle modifications for her multiple health conditions.   Objective:   Blood pressure 139/66, pulse 75, temperature 98 F (36.7 C), height 5\' 9"  (1.753 m), weight (!) 347 lb (157.4 kg), SpO2 98 %, unknown if currently breastfeeding.  Body mass index is 51.24 kg/m.  General: Cooperative, alert, well developed, in no acute distress. HEENT: Conjunctivae and lids unremarkable. Cardiovascular: Regular rhythm.  Lungs: Normal work of breathing. Neurologic: No focal deficits.   Lab Results  Component Value Date   CREATININE 0.79 12/24/2020   BUN 8  12/24/2020   NA 140 12/24/2020   K 4.8 12/24/2020   CL 101 12/24/2020   CO2 24 12/24/2020   Lab Results  Component Value Date   ALT 19 12/24/2020   AST 14 12/24/2020   ALKPHOS 95 12/24/2020   BILITOT 0.4 12/24/2020   Lab Results  Component Value Date   HGBA1C 5.8 (H) 12/24/2020   HGBA1C 5.8 09/08/2020   Lab Results  Component Value Date   INSULIN 39.3 (H) 12/24/2020   Lab Results  Component Value Date   TSH 2.47 09/08/2020   Lab Results  Component Value Date   CHOL 133 09/08/2020   HDL 44.00 09/08/2020   LDLCALC 65 09/08/2020   TRIG 119.0 09/08/2020   CHOLHDL 3 09/08/2020   Lab Results  Component Value Date   VD25OH 13.35 (L) 09/08/2020   Lab Results  Component Value Date   WBC 9.8 12/24/2020   HGB 13.8 12/24/2020   HCT 42.9 12/24/2020   MCV 88 12/24/2020   PLT 293 09/04/2020   No results found for: IRON, TIBC, FERRITIN  Attestation Statements:   Reviewed by clinician on day of visit: allergies, medications, problem list, medical history, surgical history, family history, social history, and previous encounter notes.   I, Burt Knack, am acting as transcriptionist for Quillian Quince, MD.  I have reviewed the above documentation for accuracy and completeness, and I agree with the above. -  Quillian Quince, MD

## 2021-01-13 ENCOUNTER — Encounter (INDEPENDENT_AMBULATORY_CARE_PROVIDER_SITE_OTHER): Payer: Self-pay

## 2021-01-21 ENCOUNTER — Ambulatory Visit (INDEPENDENT_AMBULATORY_CARE_PROVIDER_SITE_OTHER): Payer: BC Managed Care – PPO | Admitting: Family Medicine

## 2021-01-25 ENCOUNTER — Encounter (HOSPITAL_BASED_OUTPATIENT_CLINIC_OR_DEPARTMENT_OTHER): Payer: Self-pay | Admitting: *Deleted

## 2021-01-25 ENCOUNTER — Emergency Department (HOSPITAL_BASED_OUTPATIENT_CLINIC_OR_DEPARTMENT_OTHER)
Admission: EM | Admit: 2021-01-25 | Discharge: 2021-01-25 | Discharge status: Against Medical Advice | Payer: BC Managed Care – PPO | Source: Home / Self Care

## 2021-01-25 ENCOUNTER — Emergency Department (HOSPITAL_BASED_OUTPATIENT_CLINIC_OR_DEPARTMENT_OTHER)
Admission: EM | Admit: 2021-01-25 | Discharge: 2021-01-25 | Disposition: A | Discharge status: Home / Self Care | Payer: BC Managed Care – PPO | Attending: Emergency Medicine | Admitting: Emergency Medicine

## 2021-01-25 ENCOUNTER — Other Ambulatory Visit: Payer: Self-pay

## 2021-01-25 DIAGNOSIS — Z5321 Procedure and treatment not carried out due to patient leaving prior to being seen by health care provider: Secondary | ICD-10-CM | POA: Diagnosis not present

## 2021-01-25 DIAGNOSIS — M549 Dorsalgia, unspecified: Secondary | ICD-10-CM | POA: Diagnosis not present

## 2021-01-25 NOTE — ED Triage Notes (Signed)
C/o back pain which radiates down left lower leg x 1 month , pt reports fall in tub x 1 month ago

## 2021-01-25 NOTE — ED Notes (Addendum)
During triage pt request time of wait and states she is not waiting if wait is long , also requesting a MRI

## 2021-02-04 ENCOUNTER — Ambulatory Visit (INDEPENDENT_AMBULATORY_CARE_PROVIDER_SITE_OTHER): Payer: BC Managed Care – PPO | Admitting: Family Medicine

## 2021-02-07 ENCOUNTER — Emergency Department (HOSPITAL_COMMUNITY)
Admission: EM | Admit: 2021-02-07 | Discharge: 2021-02-07 | Disposition: A | Discharge status: Home / Self Care | Payer: BC Managed Care – PPO | Attending: Emergency Medicine | Admitting: Emergency Medicine

## 2021-02-07 DIAGNOSIS — I10 Essential (primary) hypertension: Secondary | ICD-10-CM | POA: Diagnosis not present

## 2021-02-07 DIAGNOSIS — M5442 Lumbago with sciatica, left side: Secondary | ICD-10-CM

## 2021-02-07 DIAGNOSIS — M545 Low back pain, unspecified: Secondary | ICD-10-CM | POA: Diagnosis not present

## 2021-02-07 MED ORDER — ACETAMINOPHEN 500 MG PO TABS
1000.0000 mg | ORAL_TABLET | Freq: Once | ORAL | Status: AC
  Administered 2021-02-07: 1000 mg via ORAL
  Filled 2021-02-07: qty 2

## 2021-02-07 MED ORDER — PREDNISONE 10 MG (21) PO TBPK: ORAL_TABLET | Freq: Every day | ORAL | 0 refills | Status: DC

## 2021-02-07 MED ORDER — KETOROLAC TROMETHAMINE 15 MG/ML IJ SOLN
15.0000 mg | Freq: Once | INTRAMUSCULAR | Status: AC
  Administered 2021-02-07: 15 mg via INTRAMUSCULAR
  Filled 2021-02-07: qty 1

## 2021-02-07 MED ORDER — METHOCARBAMOL 500 MG PO TABS: 500.0000 mg | ORAL_TABLET | Freq: Two times a day (BID) | ORAL | 0 refills | Status: DC

## 2021-02-07 NOTE — Discharge Instructions (Signed)
Please use Tylenol or ibuprofen for pain.  You may use 600 mg ibuprofen every 6 hours or 1000 mg of Tylenol every 6 hours.  You may choose to alternate between the 2.  This would be most effective.  Not to exceed 4 g of Tylenol within 24 hours.  Not to exceed 3200 mg ibuprofen 24 hours.  You may use the robaxin up to twice daily. 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

## 2021-02-07 NOTE — ED Triage Notes (Addendum)
Patient reports left lower leg pain that radiates up toward spine , says she feels she has pinched nerve. Pain rated 10/10, worse when she sits down and then gets up. Says it is constant pain.  Denies injury to leg. Pain started one month ago.

## 2021-02-07 NOTE — ED Provider Notes (Signed)
[facility]-EMERGENCY DEPT Provider Note   CSN: [id number] Arrival date & time: 02/07/21  1223     History Chief Complaint  Patient presents with   Leg Pain    Monique Bryan is a 30 y.o. female with no sniffing past medical history who presents with 1 month of worsening left lower back, and left leg pain.  Patient reports that she has had some pain for longer than 1 month, however it was worsened after a fall in the shower.  Patient reports that she fell on the right side, no concern for break personally.  Patient reports that the pain in her back radiates through her buttock down the back of her leg into her knee.  Patient reports she has been taking ibuprofen, Tylenol with some relief.  Patient reports that without pain control pain is 10/10.  Patient reports that pain is worse when she gets up and sits down.  Patient reports that she did have some imaging of her back around 2 months ago which do not show any acute or degenerative changes.  Patient denies any history of IV drug use, chronic corticosteroid use, cancer, recent fever, saddle anesthesia, urinary retention, difficulty with defecation.   Leg Pain Associated symptoms: back pain       Past Medical History:  Diagnosis Date   Back pain    Edema of both lower extremities    Hypertension    Medical history non-contributory    Obesity    PCOS (polycystic ovarian syndrome)    Sleep apnea    SOB (shortness of breath) on exertion     Patient Active Problem List   Diagnosis Date Noted   Encounter for general adult medical examination with abnormal findings 09/08/2020   Morbid obesity with BMI of 50.0-59.9, adult (HCC) 09/08/2020   Vitamin D deficiency disease 09/08/2020   Obesity 01/11/2019   Depression 01/11/2019   PCOS (polycystic ovarian syndrome) 01/11/2019   PIH (pregnancy induced hypertension), antepartum 03/26/2018    Past Surgical History:  Procedure Laterality Date   DILATATION &  CURETTAGE/HYSTEROSCOPY WITH MYOSURE N/A 01/21/2019   Procedure: DILATATION & CURETTAGE/HYSTEROSCOPY WITH MYOSURE;  Surgeon: Ranae Pila, MD;  Location: [facility] OR;  Service: Gynecology;  Laterality: N/A;   INTRAUTERINE DEVICE (IUD) INSERTION N/A 01/21/2019   Procedure: INTRAUTERINE DEVICE (IUD) INSERTION;  Surgeon: Ranae Pila, MD;  Location: [facility] OR;  Service: Gynecology;  Laterality: N/A;  Mirena    TONSILLECTOMY       OB History     Gravida  1   Para  1   Term  1   Preterm      AB      Living  1      SAB      IAB      Ectopic      Multiple  0   Live Births  1           Family History  Problem Relation Age of Onset   Sickle cell trait Mother     Social History   Tobacco Use   Smoking status: Never   Smokeless tobacco: Never  Vaping Use   Vaping Use: Never used  Substance Use Topics   Alcohol use: No   Drug use: Not Currently    Types: Marijuana    Home Medications Prior to Admission medications   Medication Sig Start Date End Date Taking? Authorizing Provider  methocarbamol (ROBAXIN) 500 MG tablet Take 1 tablet (500 mg total)  by mouth 2 (two) times daily. 02/07/21  Yes Trysten Berti H, PA-C  predniSONE (STERAPRED UNI-PAK 21 TAB) 10 MG (21) TBPK tablet Take by mouth daily. Take 6 tabs by mouth daily  for 2 days, then 5 tabs for 2 days, then 4 tabs for 2 days, then 3 tabs for 2 days, 2 tabs for 2 days, then 1 tab by mouth daily for 2 days 02/07/21  Yes Acea Yagi H, PA-C  Cholecalciferol (VITAMIN D3) 50 MCG (2000 UT) CAPS Take 2,000 Units by mouth.    [provider]  metFORMIN (GLUCOPHAGE) 500 MG tablet Take 1 tablet (500 mg total) by mouth daily with breakfast. 01/07/21   Quillian Quince D, MD  vitamin B-12 (CYANOCOBALAMIN) 1000 MCG tablet Take 1,000 mcg by mouth daily.    [provider]  Vitamin D, Ergocalciferol, (DRISDOL) 1.25 MG (50000 UNIT) CAPS capsule Take 1 capsule (50,000 Units total) by mouth  every 7 (seven) days. 01/07/21   Wilder Glade, MD    Allergies    Patient has no known allergies.  Review of Systems   Review of Systems  Musculoskeletal:  Positive for back pain.  All other systems reviewed and are negative.  Physical Exam Updated Vital Signs BP (!) 151/94 (BP Location: Left Arm)   Pulse 84   Temp 97.9 F (36.6 C) (Oral)   Resp 18   Ht 5\' 9"  (1.753 m)   Wt (!) 158.8 kg   SpO2 98%   BMI 51.69 kg/m   Physical Exam Vitals and nursing note reviewed.  Constitutional:      General: She is not in acute distress.    Appearance: Normal appearance.  HENT:     Head: Normocephalic and atraumatic.  Eyes:     General:        Right eye: No discharge.        Left eye: No discharge.  Cardiovascular:     Rate and Rhythm: Normal rate and regular rhythm.     Pulses: Normal pulses.     Comments: Intact DP, PT pulses bilaterally Pulmonary:     Effort: Pulmonary effort is normal. No respiratory distress.  Musculoskeletal:        General: No deformity.     Comments: Intact strength 5 out of 5 of bilateral lower extremities.  Patient without subjective numbness or tingling.  Patient with positive straight leg raise on the left.  No midline spinal tenderness, step-offs, deformity.  Skin:    General: Skin is warm and dry.     Capillary Refill: Capillary refill takes less than 2 seconds.  Neurological:     Mental Status: She is alert and oriented to person, place, and time.  Psychiatric:        Mood and Affect: Mood normal.        Behavior: Behavior normal.    ED Results / Procedures / Treatments   Labs (all labs ordered are listed, but only abnormal results are displayed) Labs Reviewed - No data to display  EKG None  Radiology No results found.  Procedures Procedures   Medications Ordered in ED Medications  ketorolac (TORADOL) 15 MG/ML injection 15 mg (15 mg Intramuscular Given 02/07/21 1423)  acetaminophen (TYLENOL) tablet 1,000 mg (1,000 mg Oral  Given 02/07/21 1423)    ED Course  I have reviewed the triage vital signs and the nursing notes.  Pertinent labs & imaging results that were available during my care of the patient were reviewed by me and considered  in my medical decision making (see chart for details).    MDM Rules/Calculators/A&P                         Patient with neurovascularly intact bilateral lower extremities.  Patient with no red flags for back pain suggestive of epidural abscess, compression fracture, spondylolisthesis, or other acute emergent spinal condition.  Patient with typical signs and symptoms of lumbar back pain with sciatica.  Positive straight leg raise.  Patient has trialed and failed over-the-counter anti-inflammatory, Tylenol at this time.  Gust obtaining interval imaging of the lower back given that patient had a fall prior to worsening of pain, however patient declines at this time.  We will treat with prednisone taper, and add a muscle relaxant to patient's pain control regimen.  Encouraged close follow-up with orthopedics.  Patient discharged in stable condition, return precautions given. Final Clinical Impression(s) / ED Diagnoses Final diagnoses:  Acute left-sided low back pain with left-sided sciatica    Rx / DC Orders ED Discharge Orders          Ordered    predniSONE (STERAPRED UNI-PAK 21 TAB) 10 MG (21) TBPK tablet  Daily        02/07/21 1406    methocarbamol (ROBAXIN) 500 MG tablet  2 times daily        02/07/21 1406             Zalma Channing, Malcom H, PA-C 02/07/21 1446    Pricilla Loveless, MD 02/08/21 1502

## 2021-02-09 ENCOUNTER — Other Ambulatory Visit (INDEPENDENT_AMBULATORY_CARE_PROVIDER_SITE_OTHER): Payer: Self-pay | Admitting: Family Medicine

## 2021-02-09 DIAGNOSIS — R7303 Prediabetes: Secondary | ICD-10-CM

## 2021-02-09 NOTE — Telephone Encounter (Signed)
Pt last seen by Dr. Beasley.  

## 2021-02-09 NOTE — Telephone Encounter (Signed)
LAST APPOINTMENT DATE: 01/07/21 NEXT APPOINTMENT DATE: 02/15/21   [id number] - [place], [place] - [address] [place] [address] Phone: [phone] Fax: [phone]  [id number] - [place], [place] - [address] [place] [address] Phone: [phone] Fax: [phone]  Patient is requesting a refill of the following medications: Requested Prescriptions   Pending Prescriptions Disp Refills   metFORMIN (GLUCOPHAGE) 500 MG tablet [Pharmacy Med Name: METFORMIN 500MG  TABLETS] 30 tablet 0    Sig: TAKE 1 TABLET(500 MG) BY MOUTH DAILY WITH BREAKFAST    Date last filled: 01/07/21 Previously prescribed by [facility]  Patient 02/04/21 appt was canceled to due leg pain and was last seen in ED 02/07/21  Lab Results  Component Value Date   HGBA1C 5.8 (H) 12/24/2020   HGBA1C 5.8 09/08/2020   Lab Results  Component Value Date   LDLCALC 65 09/08/2020   CREATININE 0.79 12/24/2020   Lab Results  Component Value Date   VD25OH 13.35 (L) 09/08/2020    BP Readings from Last 3 Encounters:  02/07/21 (!) 151/94  01/25/21 (!) 153/111  01/07/21 139/66

## 2021-02-09 NOTE — Telephone Encounter (Signed)
Mychart message sent to patient.

## 2021-02-15 ENCOUNTER — Other Ambulatory Visit: Payer: Self-pay

## 2021-02-15 ENCOUNTER — Ambulatory Visit (INDEPENDENT_AMBULATORY_CARE_PROVIDER_SITE_OTHER): Payer: BC Managed Care – PPO | Admitting: Family Medicine

## 2021-02-15 ENCOUNTER — Encounter (INDEPENDENT_AMBULATORY_CARE_PROVIDER_SITE_OTHER): Payer: Self-pay | Admitting: Family Medicine

## 2021-02-15 VITALS — BP 147/74 | HR 88 | Temp 98.3°F | Ht 69.0 in | Wt 349.0 lb

## 2021-02-15 DIAGNOSIS — R030 Elevated blood-pressure reading, without diagnosis of hypertension: Secondary | ICD-10-CM

## 2021-02-15 DIAGNOSIS — M5430 Sciatica, unspecified side: Secondary | ICD-10-CM

## 2021-02-15 DIAGNOSIS — Z6841 Body Mass Index (BMI) 40.0 and over, adult: Secondary | ICD-10-CM

## 2021-02-15 DIAGNOSIS — E559 Vitamin D deficiency, unspecified: Secondary | ICD-10-CM | POA: Diagnosis not present

## 2021-02-15 DIAGNOSIS — Z9189 Other specified personal risk factors, not elsewhere classified: Secondary | ICD-10-CM

## 2021-02-15 DIAGNOSIS — M549 Dorsalgia, unspecified: Secondary | ICD-10-CM

## 2021-02-16 MED ORDER — SAXENDA 18 MG/3ML ~~LOC~~ SOPN: 0.6000 mg | PEN_INJECTOR | Freq: Every day | SUBCUTANEOUS | 0 refills | Status: DC

## 2021-02-16 MED ORDER — VITAMIN D (ERGOCALCIFEROL) 1.25 MG (50000 UNIT) PO CAPS: 50000.0000 [IU] | ORAL_CAPSULE | ORAL | 0 refills | Status: DC

## 2021-02-16 NOTE — Progress Notes (Signed)
Chief Complaint:   OBESITY Monique Bryan is here to discuss her progress with her obesity treatment plan along with follow-up of her obesity related diagnoses. Monique Bryan is on keeping a food journal and adhering to recommended goals of 1400-1700 calories and 90+ grams of protein daily and states she is following her eating plan approximately 0% of the time. Monique Bryan states she is doing 0 minutes 0 times per week.  Today's visit was #: 3 Starting weight: 349 lbs Starting date: 12/24/2020 Today's weight: 349 lbs Today's date: 02/15/2021 Total lbs lost to date: 0 Total lbs lost since last in-office visit: 0  Interim History: Monique Bryan has been struggling with other health issues, and she hasn't been able to concentrate on weight loss. She has been hesitant to take Saxenda as she generally is trying to avoid extra medications.  Subjective:   1. Back pain with sciatica Monique Bryan's pain has worsened to the point she went to the emergency department, and she started on prednisone. She did not have any tests or studies done.  2. Elevated blood pressure reading Monique Bryan's blood pressure is elevated today. She is not on anti-hypertensives. She has had increased pain which may be contributing.  3. Vitamin D deficiency disease Monique Bryan is on Vit D, and she is due for a refill.  4. At risk for heart disease Monique Bryan is at a higher than average risk for cardiovascular disease due to obesity.   Assessment/Plan:   1. Back pain with sciatica We will refer Juli to Dr. Georgina Snell for evaluation and treatment.  2. Elevated blood pressure reading Monique Bryan will continue with diet and exercise, and we will recheck her blood pressure in 2 weeks.  3. Vitamin D deficiency disease Low Vitamin D level contributes to fatigue and are associated with obesity, breast, and colon cancer. We will refill prescription Vitamin D 50,000 IU every week #4 for 1 month. Hayes will follow-up for routine testing of Vitamin D, at  least 2-3 times per year to avoid over-replacement.  4. At risk for heart disease Monique Bryan was given approximately 15 minutes of coronary artery disease prevention counseling today. She is 30 y.o. female and has risk factors for heart disease including obesity. We discussed intensive lifestyle modifications today with an emphasis on specific weight loss instructions and strategies.   Repetitive spaced learning was employed today to elicit superior memory formation and behavioral change.  5. Obesity with current BMI of 51.7 Monique Bryan is currently in the action stage of change. As such, her goal is to continue with weight loss efforts. She has agreed to keeping a food journal and adhering to recommended goals of 1400-1700 calories and 90+ grams of protein daily.   We discussed various medication options to help Monique Bryan with her weight loss efforts and we both agreed to start Saxenda 0.6 mg q daily. She was encouraged to take it regularly as this can help treat the underlying cause of obesity.  Behavioral modification strategies: increasing lean protein intake and keeping a strict food journal.  Monique Bryan has agreed to follow-up with our clinic in 2 weeks. She was informed of the importance of frequent follow-up visits to maximize her success with intensive lifestyle modifications for her multiple health conditions.   Objective:   Blood pressure (!) 147/74, pulse 88, temperature 98.3 F (36.8 C), height 5\' 9"  (1.753 m), weight (!) 349 lb (158.3 kg), SpO2 98 %, unknown if currently breastfeeding. Body mass index is 51.54 kg/m.  General: Cooperative, alert, well developed, in no  acute distress. HEENT: Conjunctivae and lids unremarkable. Cardiovascular: Regular rhythm.  Lungs: Normal work of breathing. Neurologic: No focal deficits.   Lab Results  Component Value Date   CREATININE 0.79 12/24/2020   BUN 8 12/24/2020   NA 140 12/24/2020   K 4.8 12/24/2020   CL 101 12/24/2020   CO2 24  12/24/2020   Lab Results  Component Value Date   ALT 19 12/24/2020   AST 14 12/24/2020   ALKPHOS 95 12/24/2020   BILITOT 0.4 12/24/2020   Lab Results  Component Value Date   HGBA1C 5.8 (H) 12/24/2020   HGBA1C 5.8 09/08/2020   Lab Results  Component Value Date   INSULIN 39.3 (H) 12/24/2020   Lab Results  Component Value Date   TSH 2.47 09/08/2020   Lab Results  Component Value Date   CHOL 133 09/08/2020   HDL 44.00 09/08/2020   LDLCALC 65 09/08/2020   TRIG 119.0 09/08/2020   CHOLHDL 3 09/08/2020   Lab Results  Component Value Date   VD25OH 13.35 (L) 09/08/2020   Lab Results  Component Value Date   WBC 9.8 12/24/2020   HGB 13.8 12/24/2020   HCT 42.9 12/24/2020   MCV 88 12/24/2020   PLT 293 09/04/2020   No results found for: IRON, TIBC, FERRITIN  Attestation Statements:   Reviewed by clinician on day of visit: allergies, medications, problem list, medical history, surgical history, family history, social history, and previous encounter notes.   I, Burt Knack, am acting as transcriptionist for Quillian Quince, MD.  I have reviewed the above documentation for accuracy and completeness, and I agree with the above. -  Quillian Quince, MD

## 2021-02-17 ENCOUNTER — Telehealth (INDEPENDENT_AMBULATORY_CARE_PROVIDER_SITE_OTHER): Payer: Self-pay | Admitting: Family Medicine

## 2021-02-17 ENCOUNTER — Encounter (INDEPENDENT_AMBULATORY_CARE_PROVIDER_SITE_OTHER): Payer: Self-pay

## 2021-02-17 NOTE — Telephone Encounter (Signed)
Prior authorization approved for Saxenda. Patient sent mychart message.  

## 2021-02-23 ENCOUNTER — Other Ambulatory Visit: Payer: Self-pay

## 2021-02-23 ENCOUNTER — Encounter: Payer: Self-pay | Admitting: Family Medicine

## 2021-02-23 ENCOUNTER — Ambulatory Visit (INDEPENDENT_AMBULATORY_CARE_PROVIDER_SITE_OTHER): Payer: BC Managed Care – PPO | Admitting: Family Medicine

## 2021-02-23 VITALS — BP 130/80 | HR 93 | Ht 69.0 in | Wt 349.0 lb

## 2021-02-23 DIAGNOSIS — R29898 Other symptoms and signs involving the musculoskeletal system: Secondary | ICD-10-CM | POA: Diagnosis not present

## 2021-02-23 DIAGNOSIS — M5432 Sciatica, left side: Secondary | ICD-10-CM | POA: Diagnosis not present

## 2021-02-23 MED ORDER — GABAPENTIN 300 MG PO CAPS: 300.0000 mg | ORAL_CAPSULE | Freq: Three times a day (TID) | ORAL | 2 refills | Status: DC

## 2021-02-23 NOTE — Progress Notes (Signed)
I, Christoper Fabian, LAT, ATC, am serving as scribe for Dr. Clementeen Graham.  Subjective:    CC: Low back pain  HPI: Pt is a 30 y/o female c/o low back pain w/ radiating pain in her L leg worsening since mid-Oct. Pt was seen at the [facility] ED on 02/07/21 w/ this complaint and was given a Toradol IM injection. Pt notes that she suffered a fall in the shower and pain has worsened since then. Pt locates pain to the L side of her low back, w/ radiating pain into her L buttock, posterior thigh to knee.  Today, pt reports that her L leg pain is worse.  She states that she when she was taking prednisone and the muscle relaxer, the pain was somewhat improved but did not go away.  Her symptoms have been progressively worsening since June of this year.  She now notes some left leg weakness.  Radiating pain: yes LE numbness/tingling: cool sensation noted in her L lateral leg to her lateral calf Aggravates: prolonged sitting; transitioning from sitting to standing Treatments tried: IBU, Tylenol; muscle relaxers; prednisone  Dx imaging: 09/05/20 L-spine XR   Pertinent review of Systems: No fevers or chills  Relevant historical information: Obesity.  PCOS.   Objective:    Vitals:   02/23/21 1303  BP: 130/80  Pulse: 93  SpO2: 97%   General: Well Developed, well nourished, and in no acute distress.   MSK: Lspine.  Nontender midline.  Decreased lumbar motion. Lower extremity strength diminished left foot plantarflexion. Reflexes intact. Sensation intact. Positive left-sided slump test and straight leg raise test. Antalgic gait.  Lab and Radiology Results  EXAM: LUMBAR SPINE - COMPLETE 4 VIEW   COMPARISON:  None.   FINDINGS: Five lumbar type vertebral bodies are well visualized. Vertebral body height is well maintained. No pars defects are noted. No anterolisthesis is seen. Mild disc space narrowing at L4-5 and L5-S1 is seen. No soft tissue abnormality is noted.    IMPRESSION: Mild degenerative change without acute abnormality.     Electronically Signed   By: Alcide Clever M.D.   On: 09/05/2020 00:56   I, Clementeen Graham, personally (independently) visualized and performed the interpretation of the images attached in this note.     Impression and Recommendations:    Assessment and Plan: 30 y.o. female with worsening lumbosacral radiculopathy left side and S1 dermatomal pattern.  Patient has a history of chronic low back pain since at least June of this year.  However relatively recently she developed severe left posterior leg pain radiating down to the posterior calf.  This has been worsening over the past few weeks and now is associated mild left leg weakness and is consistent with S1 radicular pain.  She is failing conservative measures strategies including trial of prednisone.  Plan for MRI lumbar spine to characterize source of pain and for epidural steroid injection planning.  Additionally prescribed gabapentin.  Limited home exercise program taught in clinic today by ATC.  Recheck after MRI.  Likely will order epidural steroid injection directly after results of MRI.  PDMP not reviewed this encounter. Orders Placed This Encounter  Procedures   MR Lumbar Spine Wo Contrast    Evaluate cause of radicular symptoms    Standing Status:   Future    Standing Expiration Date:   02/23/2022    Order Specific Question:   What is the patient's sedation requirement?    Answer:   No Sedation    Order  Specific Question:   Does the patient have a pacemaker or implanted devices?    Answer:   No    Order Specific Question:   Preferred imaging location?    Answer:   [facility] (table limit-550lbs)   Meds ordered this encounter  Medications   gabapentin (NEURONTIN) 300 MG capsule    Sig: Take 1 capsule (300 mg total) by mouth 3 (three) times daily.    Dispense:  90 capsule    Refill:  2    Discussed warning signs or symptoms. Please see discharge  instructions. Patient expresses understanding.   The above documentation has been reviewed and is accurate and complete Clementeen Graham, M.D.

## 2021-02-23 NOTE — Patient Instructions (Addendum)
Nice to meet you.  I've ordered an MRI of you low back.  Please let us know if you haven't heard from them regarding scheduling over the next week.  I've prescribed you Gabapentin to take up to 3 times a day as needed  Please complete the exercises that the athletic trainer went over with you:  View at [email or web address] using code: [email or web address]  Follow-up after MRI

## 2021-02-28 ENCOUNTER — Other Ambulatory Visit: Payer: Self-pay

## 2021-02-28 ENCOUNTER — Ambulatory Visit (INDEPENDENT_AMBULATORY_CARE_PROVIDER_SITE_OTHER): Payer: BC Managed Care – PPO

## 2021-02-28 DIAGNOSIS — M5432 Sciatica, left side: Secondary | ICD-10-CM

## 2021-02-28 DIAGNOSIS — M5126 Other intervertebral disc displacement, lumbar region: Secondary | ICD-10-CM | POA: Diagnosis not present

## 2021-02-28 DIAGNOSIS — M48061 Spinal stenosis, lumbar region without neurogenic claudication: Secondary | ICD-10-CM | POA: Diagnosis not present

## 2021-02-28 DIAGNOSIS — R29898 Other symptoms and signs involving the musculoskeletal system: Secondary | ICD-10-CM | POA: Diagnosis not present

## 2021-02-28 DIAGNOSIS — M5127 Other intervertebral disc displacement, lumbosacral region: Secondary | ICD-10-CM | POA: Diagnosis not present

## 2021-02-28 DIAGNOSIS — M4807 Spinal stenosis, lumbosacral region: Secondary | ICD-10-CM | POA: Diagnosis not present

## 2021-03-01 ENCOUNTER — Telehealth: Payer: Self-pay | Admitting: Family Medicine

## 2021-03-01 DIAGNOSIS — M5432 Sciatica, left side: Secondary | ICD-10-CM

## 2021-03-01 NOTE — Telephone Encounter (Signed)
Epidural steroid injection ordered 

## 2021-03-01 NOTE — Progress Notes (Signed)
MRI shows a bulging disc at L5-S1 pressing on the left S1 nerve root causing sciatica.  I have ordered an epidural steroid injection which should help quite a lot.  Please contact [facility] at [phone] to schedule it.  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  Recommend return for recheck following the epidural steroid injection.

## 2021-03-02 ENCOUNTER — Encounter (INDEPENDENT_AMBULATORY_CARE_PROVIDER_SITE_OTHER): Payer: Self-pay | Admitting: Family Medicine

## 2021-03-02 ENCOUNTER — Telehealth (INDEPENDENT_AMBULATORY_CARE_PROVIDER_SITE_OTHER): Payer: BC Managed Care – PPO | Admitting: Family Medicine

## 2021-03-02 DIAGNOSIS — Z6841 Body Mass Index (BMI) 40.0 and over, adult: Secondary | ICD-10-CM

## 2021-03-02 DIAGNOSIS — M5430 Sciatica, unspecified side: Secondary | ICD-10-CM

## 2021-03-02 DIAGNOSIS — R7303 Prediabetes: Secondary | ICD-10-CM | POA: Diagnosis not present

## 2021-03-02 DIAGNOSIS — M549 Dorsalgia, unspecified: Secondary | ICD-10-CM

## 2021-03-02 MED ORDER — TRAMADOL HCL 50 MG PO TABS: 50.0000 mg | ORAL_TABLET | Freq: Three times a day (TID) | ORAL | 0 refills | Status: DC | PRN

## 2021-03-02 NOTE — Telephone Encounter (Signed)
Patient called back stating that she tried to all [facility] but had to leave a message.  She is in a lot of pain and said that she is currently taking ibuprofen and the Gabapentin at night but nothing is helping.  Is there anything else that could be prescribed to help her pain? I am also going to try to get in touch with [facility] to see if I can get her scheduled.

## 2021-03-02 NOTE — Telephone Encounter (Signed)
Called Monique Bryan and relayed Dr. Zollie Pee message regarding the Tramadol.  Monique Bryan states that she has still not heard from [facility] regarding scheduling for her ESI.

## 2021-03-02 NOTE — Addendum Note (Signed)
Addended by: Rodolph Bong on: 03/02/2021 03:33 PM   Modules accepted: Orders

## 2021-03-02 NOTE — Telephone Encounter (Signed)
I have prescribed tramadol which is an opiate for pain control.  Hopefully this will provide some pain relief.

## 2021-03-03 NOTE — Progress Notes (Signed)
TeleHealth Visit:  Due to the COVID-19 pandemic, this visit was completed with telemedicine (audio/video) technology to reduce patient and provider exposure as well as to preserve personal protective equipment.   Monique Bryan has verbally consented to this TeleHealth visit. The patient is located at home, the provider is located at the [facility] office. The participants in this visit include the listed provider and patient. The visit was conducted today via MyChart video.   Chief Complaint: OBESITY Monique Bryan is here to discuss her progress with her obesity treatment plan along with follow-up of her obesity related diagnoses. Monique Bryan is on keeping a food journal and adhering to recommended goals of 1400-1700 calories and 90+ grams of protein daily and states she is following her eating plan approximately 30% of the time. Monique Bryan states she is doing 0 minutes 0 times per week.  Today's visit was #: 4 Starting weight: 349 lbs Starting date: 12/24/2020  Interim History: [name] was changed to a virtual visit due to her daughter having an upper respiratory infection. [name] has been in a lot of pain and she hasn't been concentrating on her meal plan as much, but she also hasn't been eating as much overall. She is not exercising currently due to back pain.  Subjective:   1. Pre-diabetes Monique Bryan continues to work on decreasing simple carbohydrates in her diet. She was approved for a GLP-1, which will help with weight loss and pre-diabetes. Her recent A1c was 5.8, and her fasting insulin was 39.3.  2. Back pain with sciatica Monique Bryan was seen by Dr. Georgina Snell and she was found to have a bulging disk at L5-S1, which is impinging on her lower S1 nerve. She is going to have an epidural injection but she is not scheduled yet.  Assessment/Plan:   1. Pre-diabetes Monique Bryan agreed to start [place] as soon as possible, and she will continue with her diet, exercise, and decreasing simple  carbohydrates to help decrease the risk of diabetes.   2. Back pain with sciatica Monique Bryan is to hold off on formal exercising until her epidural and will continue to follow closely. Hopefully she will get enough pain relief to be able to increase her activity. She will continue to work on weight loss.  3. Obesity with current BMI of 51.7 Monique Bryan is currently in the action stage of change. As such, her goal is to continue with weight loss efforts. She has agreed to keeping a food journal and adhering to recommended goals of 1400-1700 calories and 90+ grams of protein daily.   Behavioral modification strategies: increasing lean protein intake, no skipping meals, and holiday eating strategies .  Monique Bryan has agreed to follow-up with our clinic in 3 weeks. She was informed of the importance of frequent follow-up visits to maximize her success with intensive lifestyle modifications for her multiple health conditions.  Objective:   VITALS: Per patient if applicable, see vitals. GENERAL: Alert and in no acute distress. CARDIOPULMONARY: No increased WOB. Speaking in clear sentences.  PSYCH: Pleasant and cooperative. Speech normal rate and rhythm. Affect is appropriate. Insight and judgement are appropriate. Attention is focused, linear, and appropriate.  NEURO: Oriented as arrived to appointment on time with no prompting.   Lab Results  Component Value Date   CREATININE 0.79 12/24/2020   BUN 8 12/24/2020   NA 140 12/24/2020   K 4.8 12/24/2020   CL 101 12/24/2020   CO2 24 12/24/2020   Lab Results  Component Value Date   ALT 19 12/24/2020  AST 14 12/24/2020   ALKPHOS 95 12/24/2020   BILITOT 0.4 12/24/2020   Lab Results  Component Value Date   HGBA1C 5.8 (H) 12/24/2020   HGBA1C 5.8 09/08/2020   Lab Results  Component Value Date   INSULIN 39.3 (H) 12/24/2020   Lab Results  Component Value Date   TSH 2.47 09/08/2020   Lab Results  Component Value Date   CHOL 133 09/08/2020    HDL 44.00 09/08/2020   LDLCALC 65 09/08/2020   TRIG 119.0 09/08/2020   CHOLHDL 3 09/08/2020   Lab Results  Component Value Date   VD25OH 13.35 (L) 09/08/2020   Lab Results  Component Value Date   WBC 9.8 12/24/2020   HGB 13.8 12/24/2020   HCT 42.9 12/24/2020   MCV 88 12/24/2020   PLT 293 09/04/2020   No results found for: IRON, TIBC, FERRITIN  Attestation Statements:   Reviewed by clinician on day of visit: allergies, medications, problem list, medical history, surgical history, family history, social history, and previous encounter notes.   I, Burt Knack, am acting as transcriptionist for Quillian Quince, MD.  I have reviewed the above documentation for accuracy and completeness, and I agree with the above. - Quillian Quince, MD

## 2021-03-04 ENCOUNTER — Other Ambulatory Visit: Payer: Self-pay | Admitting: Family Medicine

## 2021-03-04 ENCOUNTER — Ambulatory Visit
Admission: RE | Admit: 2021-03-04 | Discharge: 2021-03-04 | Disposition: A | Discharge status: Home / Self Care | Payer: BC Managed Care – PPO | Source: Ambulatory Visit | Attending: Family Medicine | Admitting: Family Medicine

## 2021-03-04 ENCOUNTER — Other Ambulatory Visit: Payer: Self-pay

## 2021-03-04 DIAGNOSIS — M5432 Sciatica, left side: Secondary | ICD-10-CM

## 2021-03-04 DIAGNOSIS — M5126 Other intervertebral disc displacement, lumbar region: Secondary | ICD-10-CM | POA: Diagnosis not present

## 2021-03-04 DIAGNOSIS — M5417 Radiculopathy, lumbosacral region: Secondary | ICD-10-CM | POA: Diagnosis not present

## 2021-03-04 MED ORDER — METHYLPREDNISOLONE ACETATE 40 MG/ML INJ SUSP (RADIOLOG
80.0000 mg | Freq: Once | INTRAMUSCULAR | Status: AC
  Administered 2021-03-04: 80 mg via EPIDURAL

## 2021-03-04 MED ORDER — IOPAMIDOL (ISOVUE-M 300) INJECTION 61%
1.0000 mL | Freq: Once | INTRAMUSCULAR | Status: AC
  Administered 2021-03-04: 1 mL via EPIDURAL

## 2021-03-04 NOTE — Discharge Instructions (Signed)

## 2021-03-10 ENCOUNTER — Encounter (INDEPENDENT_AMBULATORY_CARE_PROVIDER_SITE_OTHER): Payer: Self-pay | Admitting: Family Medicine

## 2021-03-10 ENCOUNTER — Other Ambulatory Visit (INDEPENDENT_AMBULATORY_CARE_PROVIDER_SITE_OTHER): Payer: Self-pay | Admitting: Family Medicine

## 2021-03-10 DIAGNOSIS — R7303 Prediabetes: Secondary | ICD-10-CM

## 2021-03-11 MED ORDER — BD PEN NEEDLE NANO U/F 32G X 4 MM MISC
0 refills | Status: DC
Start: 2021-03-11 — End: 2021-09-09

## 2021-03-11 NOTE — Telephone Encounter (Signed)
LAST APPOINTMENT DATE: 02/15/21 NEXT APPOINTMENT DATE: Unscheduled   [id number] [place], [place] - [address] [place] [address] Phone: [phone] Fax: [phone]  Patient is requesting a refill of the following medications: Requested Prescriptions   Pending Prescriptions Disp Refills   Liraglutide -Weight Management (SAXENDA) 18 MG/3ML SOPN 3 mL 0    Sig: Inject 0.6 mg into the skin daily.    Date last filled: 02/16/21 Previously prescribed by Dr. Dalbert Garnet  Lab Results  Component Value Date   HGBA1C 5.8 (H) 12/24/2020   HGBA1C 5.8 09/08/2020   Lab Results  Component Value Date   LDLCALC 65 09/08/2020   CREATININE 0.79 12/24/2020   Lab Results  Component Value Date   VD25OH 13.35 (L) 09/08/2020    BP Readings from Last 3 Encounters:  03/04/21 (!) 145/93  02/23/21 130/80  02/15/21 (!) 147/74

## 2021-04-19 ENCOUNTER — Telehealth: Payer: Self-pay | Admitting: Family Medicine

## 2021-04-19 DIAGNOSIS — M5432 Sciatica, left side: Secondary | ICD-10-CM

## 2021-04-19 NOTE — Telephone Encounter (Signed)
Spoke to patient. She expressed understanding.

## 2021-04-19 NOTE — Telephone Encounter (Signed)
Pt had an epidural on 12/15. She felt great and would say she had 95% improvement BUT in the past few days she feels the pain returning. Pt has discontinued the gabapentin, wonders if she should restart and if she should always take on the regular.  Also, pt would like Dr. Georgina Snell to order another epidural.

## 2021-04-19 NOTE — Telephone Encounter (Signed)
Repeat epidural steroid injection ordered.  Please call  imaging to schedule at [phone].  I would recommend taking gabapentin as needed for nerve pain.

## 2021-04-19 NOTE — Telephone Encounter (Signed)
Called pt and LM for her to return call.  Please relay Dr. Zollie Pee message regarding taking Gabapentin and regarding the fact he ordered a repeat ESI and that she needs to call to schedule her epidural.

## 2021-04-21 ENCOUNTER — Ambulatory Visit
Admission: RE | Admit: 2021-04-21 | Discharge: 2021-04-21 | Disposition: A | Discharge status: Home / Self Care | Payer: BC Managed Care – PPO | Source: Ambulatory Visit | Attending: Family Medicine | Admitting: Family Medicine

## 2021-04-21 ENCOUNTER — Other Ambulatory Visit: Payer: Self-pay

## 2021-04-21 ENCOUNTER — Other Ambulatory Visit: Payer: Self-pay | Admitting: Family Medicine

## 2021-04-21 DIAGNOSIS — M4727 Other spondylosis with radiculopathy, lumbosacral region: Secondary | ICD-10-CM | POA: Diagnosis not present

## 2021-04-21 DIAGNOSIS — M5432 Sciatica, left side: Secondary | ICD-10-CM

## 2021-04-21 MED ORDER — IOPAMIDOL (ISOVUE-M 200) INJECTION 41%
1.0000 mL | Freq: Once | INTRAMUSCULAR | Status: AC
  Administered 2021-04-21: 1 mL via EPIDURAL

## 2021-04-21 MED ORDER — METHYLPREDNISOLONE ACETATE 40 MG/ML INJ SUSP (RADIOLOG
80.0000 mg | Freq: Once | INTRAMUSCULAR | Status: AC
  Administered 2021-04-21: 80 mg via EPIDURAL

## 2021-04-21 NOTE — Discharge Instructions (Signed)

## 2021-05-03 ENCOUNTER — Other Ambulatory Visit: Payer: Self-pay

## 2021-05-03 ENCOUNTER — Ambulatory Visit
Admission: EM | Admit: 2021-05-03 | Discharge: 2021-05-03 | Disposition: A | Discharge status: Home / Self Care | Payer: BC Managed Care – PPO | Attending: Student | Admitting: Student

## 2021-05-03 DIAGNOSIS — J029 Acute pharyngitis, unspecified: Secondary | ICD-10-CM | POA: Diagnosis not present

## 2021-05-03 DIAGNOSIS — Z9089 Acquired absence of other organs: Secondary | ICD-10-CM | POA: Diagnosis not present

## 2021-05-03 DIAGNOSIS — Z975 Presence of (intrauterine) contraceptive device: Secondary | ICD-10-CM | POA: Diagnosis not present

## 2021-05-03 DIAGNOSIS — Z112 Encounter for screening for other bacterial diseases: Secondary | ICD-10-CM | POA: Diagnosis not present

## 2021-05-03 LAB — POCT RAPID STREP A (OFFICE): Rapid Strep A Screen: NEGATIVE

## 2021-05-03 MED ORDER — LIDOCAINE VISCOUS HCL 2 % MT SOLN: 15.0000 mL | OROMUCOSAL | 0 refills | Status: DC | PRN

## 2021-05-03 MED ORDER — PROMETHAZINE-DM 6.25-15 MG/5ML PO SYRP: 5.0000 mL | ORAL_SOLUTION | Freq: Four times a day (QID) | ORAL | 0 refills | Status: DC | PRN

## 2021-05-03 NOTE — ED Triage Notes (Signed)
Pt reports having a sore throat, chills, and congestion (started Friday).

## 2021-05-03 NOTE — Discharge Instructions (Addendum)
-  Promethazine DM cough syrup for congestion/cough. This could make you drowsy, so take at night before bed. -For sore throat, use lidocaine mouthwash up to every 4 hours. Make sure not to eat for at least 1 hour after using this, as your mouth will be very numb and you could bite yourself.  -You can take Tylenol up to 1000 mg 3 times daily, and ibuprofen up to 600 mg 3 times daily with food.  You can take these together, or alternate every 3-4 hours. -Mucinex during the day -With a virus, you're typically contagious for 5-7 days, or as long as you're having fevers.

## 2021-05-03 NOTE — ED Provider Notes (Signed)
[facility]    CSN: [id number] Arrival date & time: 05/03/21  1152      History   Chief Complaint Chief Complaint  Patient presents with   Nasal Congestion   Sore Throat    HPI Monique Bryan is a 31 y.o. female presenting with sore throat.  Medical history noncontributory.  Describes sore throat, nasal congestion, nonproductive cough for 4 days following exposure to daughter who is sick.  Has taken ibuprofen which does help with the symptoms.  Tolerating fluids and food.  Denies trouble swallowing, sensation of throat closing.  Denies shortness of breath, chest pain.  HPI  Past Medical History:  Diagnosis Date   Back pain    Edema of both lower extremities    Hypertension    Medical history non-contributory    Obesity    PCOS (polycystic ovarian syndrome)    Sleep apnea    SOB (shortness of breath) on exertion     Patient Active Problem List   Diagnosis Date Noted   Encounter for general adult medical examination with abnormal findings 09/08/2020   Morbid obesity with BMI of 50.0-59.9, adult ([place]) 09/08/2020   Vitamin D deficiency disease 09/08/2020   Obesity 01/11/2019   Depression 01/11/2019   PCOS (polycystic ovarian syndrome) 01/11/2019   PIH (pregnancy induced hypertension), antepartum 03/26/2018    Past Surgical History:  Procedure Laterality Date   DILATATION & CURETTAGE/HYSTEROSCOPY WITH MYOSURE N/A 01/21/2019   Procedure: DILATATION & CURETTAGE/HYSTEROSCOPY WITH MYOSURE;  Surgeon: Tyson Dense, MD;  Location: [place];  Service: Gynecology;  Laterality: N/A;   INTRAUTERINE DEVICE (IUD) INSERTION N/A 01/21/2019   Procedure: INTRAUTERINE DEVICE (IUD) INSERTION;  Surgeon: Tyson Dense, MD;  Location: [facility];  Service: Gynecology;  Laterality: N/A;  Mirena    TONSILLECTOMY      OB History     Gravida  1   Para  1   Term  1   Preterm      AB      Living  1      SAB      IAB      Ectopic      Multiple  0    Live Births  1            Home Medications    Prior to Admission medications   Medication Sig Start Date End Date Taking? Authorizing Provider  lidocaine (XYLOCAINE) 2 % solution Use as directed 15 mLs in the mouth or throat every 4 (four) hours as needed for mouth pain. 05/03/21  Yes Hazel Sams, PA-C  promethazine-dextromethorphan (PROMETHAZINE-DM) 6.25-15 MG/5ML syrup Take 5 mLs by mouth 4 (four) times daily as needed for cough. 05/03/21  Yes Hazel Sams, PA-C  Cholecalciferol (VITAMIN D3) 50 MCG (2000 UT) CAPS Take 2,000 Units by mouth.    [provider]  gabapentin (NEURONTIN) 300 MG capsule Take 1 capsule (300 mg total) by mouth 3 (three) times daily. 02/23/21   Gregor Hams, MD  Insulin Pen Needle (BD PEN NEEDLE NANO U/F) 32G X 4 MM MISC Use with Saxenda. 03/11/21   Dennard Nip D, MD  Liraglutide -Weight Management (SAXENDA) 18 MG/3ML SOPN Inject 0.6 mg into the skin daily. 02/16/21   Dennard Nip D, MD  metFORMIN (GLUCOPHAGE) 500 MG tablet Take 1 tablet (500 mg total) by mouth daily with breakfast. 01/07/21   Dennard Nip D, MD  methocarbamol (ROBAXIN) 500 MG tablet Take 1 tablet (500 mg total) by mouth  2 (two) times daily. 02/07/21   Prosperi, Christian H, PA-C  traMADol (ULTRAM) 50 MG tablet Take 1 tablet (50 mg total) by mouth every 8 (eight) hours as needed for severe pain. 03/02/21   Gregor Hams, MD  vitamin B-12 (CYANOCOBALAMIN) 1000 MCG tablet Take 1,000 mcg by mouth daily.    [provider]  Vitamin D, Ergocalciferol, (DRISDOL) 1.25 MG (50000 UNIT) CAPS capsule Take 1 capsule (50,000 Units total) by mouth every 7 (seven) days. 02/16/21   Starlyn Skeans, MD    Family History Family History  Problem Relation Age of Onset   Sickle cell trait Mother     Social History Social History   Tobacco Use   Smoking status: Never   Smokeless tobacco: Never  Vaping Use   Vaping Use: Never used  Substance Use Topics   Alcohol use: No    Drug use: Not Currently    Types: Marijuana     Allergies   Patient has no known allergies.   Review of Systems Review of Systems  Constitutional:  Negative for appetite change, chills and fever.  HENT:  Positive for congestion and sore throat. Negative for ear pain, rhinorrhea, sinus pressure and sinus pain.   Eyes:  Negative for redness and visual disturbance.  Respiratory:  Positive for cough. Negative for chest tightness, shortness of breath and wheezing.   Cardiovascular:  Negative for chest pain and palpitations.  Gastrointestinal:  Negative for abdominal pain, constipation, diarrhea, nausea and vomiting.  Genitourinary:  Negative for dysuria, frequency and urgency.  Musculoskeletal:  Negative for myalgias.  Neurological:  Negative for dizziness, weakness and headaches.  Psychiatric/Behavioral:  Negative for confusion.   All other systems reviewed and are negative.   Physical Exam Triage Vital Signs ED Triage Vitals  Enc Vitals Group     BP      Pulse      Resp      Temp      Temp src      SpO2      Weight      Height      Head Circumference      Peak Flow      Pain Score      Pain Loc      Pain Edu?      Excl. in [place]?    No data found.  Updated Vital Signs BP 132/87 (BP Location: Right Arm)    Pulse 80    Temp 97.8 F (36.6 C) (Oral)    Resp 20    SpO2 97%   Visual Acuity Right Eye Distance:   Left Eye Distance:   Bilateral Distance:    Right Eye Near:   Left Eye Near:    Bilateral Near:     Physical Exam Vitals reviewed.  Constitutional:      General: She is not in acute distress.    Appearance: Normal appearance. She is not ill-appearing.  HENT:     Head: Normocephalic and atraumatic.     Right Ear: Tympanic membrane, ear canal and external ear normal. No tenderness. No middle ear effusion. There is no impacted cerumen. Tympanic membrane is not perforated, erythematous, retracted or bulging.     Left Ear: Tympanic membrane, ear canal and  external ear normal. No tenderness.  No middle ear effusion. There is no impacted cerumen. Tympanic membrane is not perforated, erythematous, retracted or bulging.     Nose: Nose normal. No congestion.     Mouth/Throat:  Mouth: Mucous membranes are moist.     Pharynx: Uvula midline. Posterior oropharyngeal erythema present. No oropharyngeal exudate.     Comments: Smooth erythema posterior pharynx. Tonsils absent. Uvula midline. Eyes:     Extraocular Movements: Extraocular movements intact.     Pupils: Pupils are equal, round, and reactive to light.  Cardiovascular:     Rate and Rhythm: Normal rate and regular rhythm.     Heart sounds: Normal heart sounds.  Pulmonary:     Effort: Pulmonary effort is normal.     Breath sounds: Normal breath sounds. No decreased breath sounds, wheezing, rhonchi or rales.  Abdominal:     Palpations: Abdomen is soft.     Tenderness: There is no abdominal tenderness. There is no guarding or rebound.  Lymphadenopathy:     Cervical: No cervical adenopathy.     Right cervical: No superficial cervical adenopathy.    Left cervical: No superficial cervical adenopathy.  Neurological:     General: No focal deficit present.     Mental Status: She is alert and oriented to person, place, and time.  Psychiatric:        Mood and Affect: Mood normal.        Behavior: Behavior normal.        Thought Content: Thought content normal.        Judgment: Judgment normal.     UC Treatments / Results  Labs (all labs ordered are listed, but only abnormal results are displayed) Labs Reviewed  CULTURE, GROUP A STREP [facility])  POCT RAPID STREP A (OFFICE)    EKG   Radiology No results found.  Procedures Procedures (including critical care time)  Medications Ordered in UC Medications - No data to display  Initial Impression / Assessment and Plan / UC Course  I have reviewed the triage vital signs and the nursing notes.  Pertinent labs & imaging results that  were available during my care of the patient were reviewed by me and considered in my medical decision making (see chart for details).     This patient is a very pleasant 30 y.o. year old female presenting with viral pharyngitis. Today this pt is afebrile nontachycardic nontachypneic, oxygenating well on room air, no wheezes rhonchi or rales. IUD contraception.  History tonsillectomy. Rapid strep negative, culture sent.  Viscous lidocaine and promethazine DM sent.   ED return precautions discussed. Patient verbalizes understanding and agreement.    Final Clinical Impressions(s) / UC Diagnoses   Final diagnoses:  Viral pharyngitis  Screening for streptococcal infection  IUD contraception  History of tonsillectomy     Discharge Instructions      -Promethazine DM cough syrup for congestion/cough. This could make you drowsy, so take at night before bed. -For sore throat, use lidocaine mouthwash up to every 4 hours. Make sure not to eat for at least 1 hour after using this, as your mouth will be very numb and you could bite yourself.  -You can take Tylenol up to 1000 mg 3 times daily, and ibuprofen up to 600 mg 3 times daily with food.  You can take these together, or alternate every 3-4 hours. -Mucinex during the day -With a virus, you're typically contagious for 5-7 days, or as long as you're having fevers.       ED Prescriptions     Medication Sig Dispense Auth. Provider   promethazine-dextromethorphan (PROMETHAZINE-DM) 6.25-15 MG/5ML syrup Take 5 mLs by mouth 4 (four) times daily as needed for cough. 118 mL Phillip Heal,  Sherlon Handing, PA-C   lidocaine (XYLOCAINE) 2 % solution Use as directed 15 mLs in the mouth or throat every 4 (four) hours as needed for mouth pain. 100 mL Hazel Sams, PA-C      PDMP not reviewed this encounter.   Hazel Sams, PA-C 05/03/21 1413

## 2021-05-06 LAB — CULTURE, GROUP A STREP (THRC)

## 2021-05-18 ENCOUNTER — Ambulatory Visit (INDEPENDENT_AMBULATORY_CARE_PROVIDER_SITE_OTHER): Payer: BC Managed Care – PPO | Admitting: Physician Assistant

## 2021-05-24 ENCOUNTER — Ambulatory Visit (INDEPENDENT_AMBULATORY_CARE_PROVIDER_SITE_OTHER): Payer: BC Managed Care – PPO | Admitting: Family Medicine

## 2021-05-25 ENCOUNTER — Ambulatory Visit (INDEPENDENT_AMBULATORY_CARE_PROVIDER_SITE_OTHER): Payer: BC Managed Care – PPO | Admitting: Family Medicine

## 2021-05-25 ENCOUNTER — Telehealth: Payer: Self-pay | Admitting: Family Medicine

## 2021-05-25 DIAGNOSIS — M5432 Sciatica, left side: Secondary | ICD-10-CM

## 2021-05-25 NOTE — Telephone Encounter (Signed)
Spoke with pt, pain is down the leg NOT back pain. Pt is interested in another epidural. She did have more success with the first one then she did the second. ?

## 2021-05-25 NOTE — Telephone Encounter (Signed)
Patient called stating that she is having more pain. She would like to know if she can have another epidural. ?She said that the second epidural did not seem to help as much as the first one. ? ?Please advise. ? ?

## 2021-05-25 NOTE — Telephone Encounter (Signed)
To clarify.  Are you having pain down your leg or back pain? ?An epidural steroid injection would help for pain down your leg.  If you are experiencing mostly back pain we should do a different back injection. ? ?Please let me know where your pain is mostly located so I can order the appropriate injection.  If you are not sure we should have an office visit. ?

## 2021-05-26 ENCOUNTER — Ambulatory Visit (INDEPENDENT_AMBULATORY_CARE_PROVIDER_SITE_OTHER): Payer: BC Managed Care – PPO | Admitting: Family Medicine

## 2021-05-26 ENCOUNTER — Encounter (INDEPENDENT_AMBULATORY_CARE_PROVIDER_SITE_OTHER): Payer: Self-pay | Admitting: Family Medicine

## 2021-05-26 ENCOUNTER — Other Ambulatory Visit: Payer: Self-pay

## 2021-05-26 VITALS — BP 144/83 | HR 92 | Temp 97.9°F | Ht 69.0 in | Wt 353.0 lb

## 2021-05-26 DIAGNOSIS — E559 Vitamin D deficiency, unspecified: Secondary | ICD-10-CM | POA: Diagnosis not present

## 2021-05-26 DIAGNOSIS — E538 Deficiency of other specified B group vitamins: Secondary | ICD-10-CM | POA: Diagnosis not present

## 2021-05-26 DIAGNOSIS — E669 Obesity, unspecified: Secondary | ICD-10-CM | POA: Diagnosis not present

## 2021-05-26 DIAGNOSIS — Z9189 Other specified personal risk factors, not elsewhere classified: Secondary | ICD-10-CM

## 2021-05-26 DIAGNOSIS — Z6841 Body Mass Index (BMI) 40.0 and over, adult: Secondary | ICD-10-CM | POA: Diagnosis not present

## 2021-05-26 NOTE — Telephone Encounter (Signed)
Spoke to patient. She is going to contact [facility] to schedule. ?

## 2021-05-26 NOTE — Telephone Encounter (Signed)
Called pt and LM for her to return call to office so that we can provide info for recently ordered repeat ESI.  No DPR so did not leave detailed message. ?

## 2021-05-26 NOTE — Telephone Encounter (Signed)
Third epidural steroid injection ordered.  You can have 3 injections in 6 months.  Please contact [facility] to schedule.  Phone number is [phone] or [phone]. ?

## 2021-05-27 MED ORDER — VITAMIN D (ERGOCALCIFEROL) 1.25 MG (50000 UNIT) PO CAPS: 50000.0000 [IU] | ORAL_CAPSULE | ORAL | 0 refills | Status: DC

## 2021-05-27 MED ORDER — SEMAGLUTIDE-WEIGHT MANAGEMENT 0.25 MG/0.5ML ~~LOC~~ SOAJ: 0.2500 mg | SUBCUTANEOUS | 0 refills | Status: DC

## 2021-05-27 NOTE — Progress Notes (Signed)
? ? ? ?Chief Complaint:  ? ?OBESITY ?Monique Bryan is here to discuss her progress with her obesity treatment plan along with follow-up of her obesity related diagnoses. Monique Bryan is on keeping a food journal and adhering to recommended goals of 1400-1700 calories and 90+ grams of protein daily and states she is following her eating plan approximately 0% of the time. Monique Bryan states she is doing 0 minutes 0 times per week. ? ?Today's visit was #: 5 ?Starting weight: 349 lbs ?Starting date: 12/24/2020 ?Today's weight: 353 lbs ?Today's date: 05/26/2021 ?Total lbs lost to date: 0 ?Total lbs lost since last in-office visit: 0 ? ?Interim History: Monique Bryan's last visit was approximately 4 months ago. She is ready to get back on track. She is still considering weight loss surgery, but she is willing to work on her diet in the meanwhile. She struggled to remember to take Saxenda daily and she would like to try to take a weekly medication instead. ? ?Subjective:  ? ?1. Vitamin D deficiency disease ?Monique Bryan's last Vit D level was low. She is out of her Vit D, and she notes fatigue. ? ?2. B12 deficiency ?Monique Bryan started OTC B12. She is ready to work on her B12 rich eating plan. ? ?3. At risk for diabetes mellitus ?Monique Bryan is at higher than average risk for developing diabetes due to her obesity. ? ?Assessment/Plan:  ? ?1. Vitamin D deficiency disease ?Low Vitamin D level contributes to fatigue and are associated with obesity, breast, and colon cancer. We will refill prescription Vitamin D for 1 month. We will recheck labs in 2 month, and Nate will follow-up for routine testing of Vitamin D, at least 2-3 times per year to avoid over-replacement. ? ?- Vitamin D, Ergocalciferol, (DRISDOL) 1.25 MG (50000 UNIT) CAPS capsule; Take 1 capsule (50,000 Units total) by mouth every 7 (seven) days.  Dispense: 4 capsule; Refill: 0 ? ?2. B12 deficiency ?The diagnosis was reviewed with the patient. Monique Bryan will continue with diet and exercise, and we  will recheck labs in 2 months. Orders and follow up as documented in patient record. ? ?3. At risk for diabetes mellitus ?Monique Bryan was given approximately 15 minutes of diabetic education and counseling today. We discussed intensive lifestyle modifications today with an emphasis on weight loss as well as increasing exercise and decreasing simple carbohydrates in her diet. We also reviewed medication options with an emphasis on risk versus benefits of those discussed. ? ?Repetitive spaced learning was employed today to elicit superior memory formation and behavioral change. ? ?4. Obesity with current BMI of 52.1 ?Monique Bryan is currently in the action stage of change. As such, her goal is to get back to weightloss efforts . She has agreed to the Category 2 Plan with breakfast options.  ? ?We discussed various medication options to help Monique Bryan with her weight loss efforts and we both agreed to discontinue Saxenda, and start Wegovy 0.25 mg q week with no refills. Prior authorization needed. ? ?- Semaglutide-Weight Management 0.25 MG/0.5ML SOAJ; Inject 0.25 mg into the skin once a week. Take with breakfast.  Dispense: 2 mL; Refill: 0 ? ?Behavioral modification strategies: increasing lean protein intake. ? ?Monique Bryan has agreed to follow-up with our clinic in 2 weeks. She was informed of the importance of frequent follow-up visits to maximize her success with intensive lifestyle modifications for her multiple health conditions.  ? ?Objective:  ? ?Blood pressure (!) 144/83, pulse 92, temperature 97.9 ?F (36.6 ?C), height 5\' 9"  (1.753 m), weight (!) 353 lb (160.1  kg), SpO2 95 %, unknown if currently breastfeeding. ?Body mass index is 52.13 kg/m?. ? ?General: Cooperative, alert, well developed, in no acute distress. ?HEENT: Conjunctivae and lids unremarkable. ?Cardiovascular: Regular rhythm.  ?Lungs: Normal work of breathing. ?Neurologic: No focal deficits.  ? ?Lab Results  ?Component Value Date  ? CREATININE 0.79 12/24/2020  ?  BUN 8 12/24/2020  ? NA 140 12/24/2020  ? K 4.8 12/24/2020  ? CL 101 12/24/2020  ? CO2 24 12/24/2020  ? ?Lab Results  ?Component Value Date  ? ALT 19 12/24/2020  ? AST 14 12/24/2020  ? ALKPHOS 95 12/24/2020  ? BILITOT 0.4 12/24/2020  ? ?Lab Results  ?Component Value Date  ? HGBA1C 5.8 (H) 12/24/2020  ? HGBA1C 5.8 09/08/2020  ? ?Lab Results  ?Component Value Date  ? INSULIN 39.3 (H) 12/24/2020  ? ?Lab Results  ?Component Value Date  ? TSH 2.47 09/08/2020  ? ?Lab Results  ?Component Value Date  ? CHOL 133 09/08/2020  ? HDL 44.00 09/08/2020  ? [place] 65 09/08/2020  ? TRIG 119.0 09/08/2020  ? CHOLHDL 3 09/08/2020  ? ?Lab Results  ?Component Value Date  ? VD25OH 13.35 (L) 09/08/2020  ? ?Lab Results  ?Component Value Date  ? WBC 9.8 12/24/2020  ? HGB 13.8 12/24/2020  ? HCT 42.9 12/24/2020  ? MCV 88 12/24/2020  ? PLT 293 09/04/2020  ? ?No results found for: IRON, TIBC, FERRITIN ? ?Attestation Statements:  ? ?Reviewed by clinician on day of visit: allergies, medications, problem list, medical history, surgical history, family history, social history, and previous encounter notes. ? ? ?I, Trixie Dredge, am acting as transcriptionist for Dennard Nip, MD. ? ?I have reviewed the above documentation for accuracy and completeness, and I agree with the above. -  Dennard Nip, MD ? ? ?

## 2021-05-29 ENCOUNTER — Other Ambulatory Visit (INDEPENDENT_AMBULATORY_CARE_PROVIDER_SITE_OTHER): Payer: Self-pay | Admitting: Family Medicine

## 2021-05-29 DIAGNOSIS — Z6841 Body Mass Index (BMI) 40.0 and over, adult: Secondary | ICD-10-CM

## 2021-05-31 NOTE — Telephone Encounter (Signed)
Dr.Beasley 

## 2021-06-01 ENCOUNTER — Ambulatory Visit
Admission: RE | Admit: 2021-06-01 | Discharge: 2021-06-01 | Disposition: A | Discharge status: Home / Self Care | Payer: BC Managed Care – PPO | Source: Ambulatory Visit | Attending: Family Medicine | Admitting: Family Medicine

## 2021-06-01 ENCOUNTER — Other Ambulatory Visit: Payer: Self-pay

## 2021-06-01 DIAGNOSIS — M5432 Sciatica, left side: Secondary | ICD-10-CM

## 2021-06-01 DIAGNOSIS — M4728 Other spondylosis with radiculopathy, sacral and sacrococcygeal region: Secondary | ICD-10-CM | POA: Diagnosis not present

## 2021-06-01 MED ORDER — METHYLPREDNISOLONE ACETATE 40 MG/ML INJ SUSP (RADIOLOG
80.0000 mg | Freq: Once | INTRAMUSCULAR | Status: AC
  Administered 2021-06-01: 80 mg via EPIDURAL

## 2021-06-01 MED ORDER — IOPAMIDOL (ISOVUE-M 200) INJECTION 41%
1.0000 mL | Freq: Once | INTRAMUSCULAR | Status: AC
  Administered 2021-06-01: 1 mL via EPIDURAL

## 2021-06-01 NOTE — Discharge Instructions (Signed)

## 2021-06-04 DIAGNOSIS — Z113 Encounter for screening for infections with a predominantly sexual mode of transmission: Secondary | ICD-10-CM | POA: Diagnosis not present

## 2021-06-04 DIAGNOSIS — B3731 Acute candidiasis of vulva and vagina: Secondary | ICD-10-CM | POA: Diagnosis not present

## 2021-06-07 ENCOUNTER — Telehealth (INDEPENDENT_AMBULATORY_CARE_PROVIDER_SITE_OTHER): Payer: Self-pay | Admitting: Family Medicine

## 2021-06-07 ENCOUNTER — Encounter (INDEPENDENT_AMBULATORY_CARE_PROVIDER_SITE_OTHER): Payer: Self-pay

## 2021-06-07 NOTE — Telephone Encounter (Signed)
Prior authorization approved for [facility]. Effective 05/27/21 - 09/29/21. Patient sent approval message via mychart.  ?

## 2021-06-08 ENCOUNTER — Ambulatory Visit (INDEPENDENT_AMBULATORY_CARE_PROVIDER_SITE_OTHER): Payer: BC Managed Care – PPO | Admitting: Nurse Practitioner

## 2021-06-28 ENCOUNTER — Ambulatory Visit (INDEPENDENT_AMBULATORY_CARE_PROVIDER_SITE_OTHER): Payer: BC Managed Care – PPO | Admitting: Family Medicine

## 2021-07-05 ENCOUNTER — Ambulatory Visit (INDEPENDENT_AMBULATORY_CARE_PROVIDER_SITE_OTHER): Payer: BC Managed Care – PPO | Admitting: Family Medicine

## 2021-07-21 ENCOUNTER — Other Ambulatory Visit (INDEPENDENT_AMBULATORY_CARE_PROVIDER_SITE_OTHER): Payer: Self-pay | Admitting: Family Medicine

## 2021-07-21 ENCOUNTER — Other Ambulatory Visit: Payer: Self-pay | Admitting: Internal Medicine

## 2021-07-21 DIAGNOSIS — E559 Vitamin D deficiency, unspecified: Secondary | ICD-10-CM

## 2021-07-21 MED ORDER — VITAMIN B-12 1000 MCG PO TABS: 1000.0000 ug | ORAL_TABLET | Freq: Every day | ORAL | 1 refills | Status: DC

## 2021-07-21 NOTE — Telephone Encounter (Signed)
LAST APPOINTMENT DATE: 06/05/2021  ?NEXT APPOINTMENT DATE: 07/22/2021 with [facility] ? ? ?[id number] - [place] ?[place] [address] ?Phone: [phone] Fax: [phone] ? ?[id number] - [place] [place] [address] ?[address] ?[place] [address] ?Phone: [phone] Fax: [phone] ? ?Patient is requesting a refill of the following medications: ?Requested Prescriptions  ? ?Pending Prescriptions Disp Refills  ? Vitamin D, Ergocalciferol, (DRISDOL) 1.25 MG (50000 UNIT) CAPS capsule 4 capsule 0  ?  Sig: Take 1 capsule (50,000 Units total) by mouth every 7 (seven) days.  ? Semaglutide-Weight Management 0.25 MG/0.5ML SOAJ 2 mL 0  ?  Sig: Inject 0.25 mg into the skin once a week. Take with breakfast.  ? ? ?Date last filled: 05/27/2021 ?Previously prescribed by Dr. Leafy Ro ? ?Lab Results  ?Component Value Date  ? HGBA1C 5.8 (H) 12/24/2020  ? HGBA1C 5.8 09/08/2020  ? ?Lab Results  ?Component Value Date  ? [place] 65 09/08/2020  ? CREATININE 0.79 12/24/2020  ? ?Lab Results  ?Component Value Date  ? VD25OH 13.35 (L) 09/08/2020  ? ? ?BP Readings from Last 3 Encounters:  ?06/01/21 (!) 160/81  ?05/26/21 (!) 144/83  ?05/03/21 132/87  ? ? ?

## 2021-07-22 ENCOUNTER — Telehealth (INDEPENDENT_AMBULATORY_CARE_PROVIDER_SITE_OTHER): Payer: BC Managed Care – PPO | Admitting: Family Medicine

## 2021-07-22 ENCOUNTER — Encounter (INDEPENDENT_AMBULATORY_CARE_PROVIDER_SITE_OTHER): Payer: Self-pay | Admitting: Family Medicine

## 2021-07-22 DIAGNOSIS — E559 Vitamin D deficiency, unspecified: Secondary | ICD-10-CM

## 2021-07-22 DIAGNOSIS — Z6841 Body Mass Index (BMI) 40.0 and over, adult: Secondary | ICD-10-CM

## 2021-07-22 DIAGNOSIS — F5089 Other specified eating disorder: Secondary | ICD-10-CM

## 2021-07-22 DIAGNOSIS — E669 Obesity, unspecified: Secondary | ICD-10-CM | POA: Diagnosis not present

## 2021-07-22 MED ORDER — WEGOVY 0.5 MG/0.5ML ~~LOC~~ SOAJ: 0.5000 mg | SUBCUTANEOUS | 0 refills | Status: DC

## 2021-07-22 MED ORDER — VITAMIN D (ERGOCALCIFEROL) 1.25 MG (50000 UNIT) PO CAPS: 50000.0000 [IU] | ORAL_CAPSULE | ORAL | 0 refills | Status: DC

## 2021-07-22 NOTE — Progress Notes (Signed)
?TeleHealth Visit:  ?This visit was completed with telemedicine (audio/video) technology. ?[facility] has verbally consented to this TeleHealth visit. The patient is located at home, the provider is located at home. The participants in this visit include the listed provider and patient. The visit was conducted today via MyChart video. ? ?OBESITY ?Monique Bryan is here to discuss her progress with her obesity treatment plan along with follow-up of her obesity related diagnoses.  ? ?Today's visit was # 6 ?Starting weight: 349 lbs ?Starting date: 12/24/2020 ?Weight at last in office visit: 353 lbs on 05/26/21 ?Total weight loss: 0 lbs at last in office visit on 05/26/21. ?Today's reported weight: No weight reported. ? ?Nutrition Plan: the Category 2 Plan.  ?Hunger is poorly controlled. Cravings are poorly controlled.  ?Current exercise: none ? ?Interim History: Monique Bryan recently got back from a cruise and was off plan.  She reports that she has not been following the category 2 plan well over the past several weeks.  She tends to have fast food and drink sweet tea and regular soda.  She has a 24-year-old daughter and it is convenient to pick up fast food after work. ?She is not noticing any appetite suppression from the [facility].  She was recently switched from [place] 0.6 mg dose to Wegovy 0.25 mg dose.  She is a bit discouraged because of this. ?She is still considering weight loss surgery but is unsure how to proceed with this.  She was well into the approval process in the past but her surgeon retired and she did not pass her psychological evaluation and she got derailed. ? ?Assessment/Plan:  ?1. Vitamin D Deficiency ?Vitamin D is not at goal of 50.  Vitamin D is very low at 13.35. She is on weekly prescription Vitamin D 50,000 IU.  ?Lab Results  ?Component Value Date  ? VD25OH 13.35 (L) 09/08/2020  ? ? ?Plan: ?Refill prescription vitamin D 50,000 IU weekly. ? ?2.  Emotional eating ?Monique Bryan notes excessive cravings and poor  food choices.  When she originally tried to get approved for weight loss surgery she did not pass the psychological evaluation. ? ?Plan: ?Will consider referring to Dr. Mallie Mussel at next office visit. ? ? ?3.  Obesity: Current BMI 52.11 ?Monique Bryan is currently in the action stage of change. As such, her goal is to continue with weight loss efforts.  ?She has agreed to the Category 2 Plan.  ? ?Exercise goals: No exercise has been prescribed at this time. ? ?Behavioral modification strategies: increasing lean protein intake, decreasing simple carbohydrates, decreasing liquid calories, and meal planning and cooking strategies. ? ?Encouraged her to cook dinner at home twice weekly and make enough for leftovers. ?Dining out handout sent via [place]. ? ?We discussed bariatric surgery at length.  I advised her that it is a very good tool but she must have good habits to go along with it to lose weight and maintain it. ?Advised her to call [facility] to see what she needs to do to restart the approval process. ? ?Monique Bryan has agreed to follow-up with our clinic in 3 weeks.  ? ?No orders of the defined types were placed in this encounter. ? ? ?Medications Discontinued During This Encounter  ?Medication Reason  ? Semaglutide-Weight Management 0.25 MG/0.5ML SOAJ Dose change  ? Liraglutide -Weight Management (SAXENDA) [id number] SOPN Duplicate  ? Vitamin D, Ergocalciferol, (DRISDOL) 1.25 MG (50000 UNIT) CAPS capsule Reorder  ?  ? ?Meds ordered this encounter  ?Medications  ? Semaglutide-Weight Management (WEGOVY) 0.5  MG/0.5ML SOAJ  ?  Sig: Inject 0.5 mg into the skin once a week.  ?  Dispense:  2 mL  ?  Refill:  0  ?  Order Specific Question:   Supervising Provider  ?  Answer:   Dennard Nip D [id number]  ? Vitamin D, Ergocalciferol, (DRISDOL) 1.25 MG (50000 UNIT) CAPS capsule  ?  Sig: Take 1 capsule (50,000 Units total) by mouth every 7 (seven) days.  ?  Dispense:  4 capsule  ?  Refill:  0  ?  Order Specific Question:   Supervising Provider   ?  Answer:   Dennard Nip D [id number]  ?   ? ?Objective:  ? ?VITALS: Per patient if applicable, see vitals. ?GENERAL: Alert and in no acute distress. ?CARDIOPULMONARY: No increased WOB. Speaking in clear sentences.  ?PSYCH: Pleasant and cooperative. Speech normal rate and rhythm. Affect is appropriate. Insight and judgement are appropriate. Attention is focused, linear, and appropriate.  ?NEURO: Oriented as arrived to appointment on time with no prompting.  ? ?Lab Results  ?Component Value Date  ? CREATININE 0.79 12/24/2020  ? BUN 8 12/24/2020  ? NA 140 12/24/2020  ? K 4.8 12/24/2020  ? CL 101 12/24/2020  ? CO2 24 12/24/2020  ? ?Lab Results  ?Component Value Date  ? ALT 19 12/24/2020  ? AST 14 12/24/2020  ? ALKPHOS 95 12/24/2020  ? BILITOT 0.4 12/24/2020  ? ?Lab Results  ?Component Value Date  ? HGBA1C 5.8 (H) 12/24/2020  ? HGBA1C 5.8 09/08/2020  ? ?Lab Results  ?Component Value Date  ? INSULIN 39.3 (H) 12/24/2020  ? ?Lab Results  ?Component Value Date  ? TSH 2.47 09/08/2020  ? ?Lab Results  ?Component Value Date  ? CHOL 133 09/08/2020  ? HDL 44.00 09/08/2020  ? [place] 65 09/08/2020  ? TRIG 119.0 09/08/2020  ? CHOLHDL 3 09/08/2020  ? ?Lab Results  ?Component Value Date  ? WBC 9.8 12/24/2020  ? HGB 13.8 12/24/2020  ? HCT 42.9 12/24/2020  ? MCV 88 12/24/2020  ? PLT 293 09/04/2020  ? ?No results found for: IRON, TIBC, FERRITIN ?Lab Results  ?Component Value Date  ? VD25OH 13.35 (L) 09/08/2020  ? ? ?Attestation Statements:  ? ?Reviewed by clinician on day of visit: allergies, medications, problem list, medical history, surgical history, family history, social history, and previous encounter notes. ? ?

## 2021-08-08 ENCOUNTER — Other Ambulatory Visit (INDEPENDENT_AMBULATORY_CARE_PROVIDER_SITE_OTHER): Payer: Self-pay | Admitting: Family Medicine

## 2021-08-08 ENCOUNTER — Other Ambulatory Visit: Payer: Self-pay | Admitting: Internal Medicine

## 2021-08-08 DIAGNOSIS — E559 Vitamin D deficiency, unspecified: Secondary | ICD-10-CM

## 2021-08-09 MED ORDER — VITAMIN D (ERGOCALCIFEROL) 1.25 MG (50000 UNIT) PO CAPS: 50000.0000 [IU] | ORAL_CAPSULE | ORAL | 0 refills | Status: DC

## 2021-08-09 MED ORDER — WEGOVY 0.5 MG/0.5ML ~~LOC~~ SOAJ: 0.5000 mg | SUBCUTANEOUS | 0 refills | Status: DC

## 2021-08-09 MED ORDER — VITAMIN D (CHOLECALCIFEROL) 50 MCG (2000 UT) PO CAPS: 2000.0000 [IU] | ORAL_CAPSULE | Freq: Every day | ORAL | 1 refills | Status: DC

## 2021-08-09 NOTE — Telephone Encounter (Signed)
LAST APPOINTMENT DATE: 07/23/21 NEXT APPOINTMENT DATE: 08/12/21   [id number] - [place], [place] [address] [place] [address] Phone: [phone] Fax: [phone]  [id number] - [place], [address] [facility] [address] [place] [address] Phone: [phone] Fax: [phone]  Patient is requesting a refill of the following medications: Pending Prescriptions:                       Disp   Refills   Semaglutide-Weight Management (WEGOVY) 0.5*2 mL   0       Sig: Inject 0.5 mg into the skin once a week.   Vitamin D, Ergocalciferol, (DRISDOL) 1.25 *4 caps*0       Sig: Take 1 capsule (50,000 Units total) by mouth every 7          (seven) days.   Date last filled: 07/23/21 Previously prescribed by [facility]  Lab Results      Component                Value               Date                      HGBA1C                   5.8 (H)             12/24/2020                HGBA1C                   5.8                 09/08/2020           Lab Results      Component                Value               Date                      LDLCALC                  65                  09/08/2020                CREATININE               0.79                12/24/2020           Lab Results      Component                Value               Date                      VD25OH                   13.35 (L)           09/08/2020            BP Readings from Last 3 Encounters: 06/01/21 : (!) 160/81 05/26/21 : (!) 144/83 05/03/21 : 132/87

## 2021-08-09 NOTE — Telephone Encounter (Signed)
LOV: 09/08/20

## 2021-08-12 ENCOUNTER — Encounter (INDEPENDENT_AMBULATORY_CARE_PROVIDER_SITE_OTHER): Payer: Self-pay | Admitting: Family Medicine

## 2021-08-12 ENCOUNTER — Ambulatory Visit (INDEPENDENT_AMBULATORY_CARE_PROVIDER_SITE_OTHER): Payer: BC Managed Care – PPO | Admitting: Family Medicine

## 2021-08-12 VITALS — BP 137/79 | HR 97 | Temp 98.2°F | Ht 69.0 in | Wt 356.0 lb

## 2021-08-12 DIAGNOSIS — E669 Obesity, unspecified: Secondary | ICD-10-CM | POA: Diagnosis not present

## 2021-08-12 DIAGNOSIS — Z6841 Body Mass Index (BMI) 40.0 and over, adult: Secondary | ICD-10-CM

## 2021-08-12 DIAGNOSIS — E65 Localized adiposity: Secondary | ICD-10-CM | POA: Diagnosis not present

## 2021-08-12 DIAGNOSIS — E559 Vitamin D deficiency, unspecified: Secondary | ICD-10-CM

## 2021-08-12 MED ORDER — VITAMIN D (ERGOCALCIFEROL) 1.25 MG (50000 UNIT) PO CAPS: 50000.0000 [IU] | ORAL_CAPSULE | ORAL | 0 refills | Status: DC

## 2021-08-12 MED ORDER — SEMAGLUTIDE-WEIGHT MANAGEMENT 1 MG/0.5ML ~~LOC~~ SOAJ: 1.0000 mg | SUBCUTANEOUS | 0 refills | Status: DC

## 2021-08-17 ENCOUNTER — Other Ambulatory Visit (INDEPENDENT_AMBULATORY_CARE_PROVIDER_SITE_OTHER): Payer: Self-pay | Admitting: Family Medicine

## 2021-08-20 ENCOUNTER — Other Ambulatory Visit (INDEPENDENT_AMBULATORY_CARE_PROVIDER_SITE_OTHER): Payer: Self-pay | Admitting: Family Medicine

## 2021-08-21 NOTE — Progress Notes (Signed)
Chief Complaint:   OBESITY Monique Bryan is here to discuss her progress with her obesity treatment plan along with follow-up of her obesity related diagnoses. Monique Bryan is on the Category 2 Plan with breakfast options and states she is following her eating plan approximately 20% of the time. Monique Bryan states she is walking 15 minutes 1 times per week.  Today's visit was #: 7 Starting weight: 349 lbs Starting date: 12/24/2020 Today's weight: 356 lbs Today's date: 08/12/2021 Total lbs lost to date: 0 Total lbs lost since last in-office visit: +3  Interim History: Monique Bryan is a pt of Dr. Francena Hanly. She is up 7 lbs from when she started the program.  Subjective:   1. Visceral obesity Pt has been on [facility] for several weeks and reports a good decrease in hunger and cravings.  2. Vitamin D deficiency She is currently taking prescription vitamin D 50,000 IU each week. She denies nausea, vomiting or muscle weakness.  Lab Results  Component Value Date   VD25OH 13.35 (L) 09/08/2020   Assessment/Plan:  No orders of the defined types were placed in this encounter.   Medications Discontinued During This Encounter  Medication Reason   Semaglutide-Weight Management (WEGOVY) 0.5 MG/0.5ML SOAJ    Vitamin D, Ergocalciferol, (DRISDOL) 1.25 MG (50000 UNIT) CAPS capsule Reorder     Meds ordered this encounter  Medications   Vitamin D, Ergocalciferol, (DRISDOL) 1.25 MG (50000 UNIT) CAPS capsule    Sig: Take 1 capsule (50,000 Units total) by mouth every 7 (seven) days.    Dispense:  4 capsule    Refill:  0   Semaglutide-Weight Management 1 MG/0.5ML SOAJ    Sig: Inject 1 mg into the skin once a week.    Dispense:  2 mL    Refill:  0     1. Visceral obesity Increase Wegovy to 1 mg and switch to taking it every Thursday.   Increase & Refill- Semaglutide-Weight Management 1 MG/0.5ML SOAJ; Inject 1 mg into the skin once a week.  Dispense: 2 mL; Refill: 0  2. Vitamin D deficiency Low Vitamin D  level contributes to fatigue and are associated with obesity, breast, and colon cancer. She agrees to continue to take prescription Vitamin D @50 ,000 IU every week and will follow-up for routine testing of Vitamin D, at least 2-3 times per year to avoid over-replacement.  Refill- Vitamin D, Ergocalciferol, (DRISDOL) 1.25 MG (50000 UNIT) CAPS capsule; Take 1 capsule (50,000 Units total) by mouth every 7 (seven) days.  Dispense: 4 capsule; Refill: 0  3. Obesity with current BMI of 52.1 Monique Bryan is currently in the action stage of change. As such, her goal is to continue with weight loss efforts. She has agreed to change to keeping a food journal and adhering to recommended goals of 1700-1800 calories and 140+ grams protein.   Exercise goals:  As is  Behavioral modification strategies: increasing lean protein intake, decreasing simple carbohydrates, and keeping a strict food journal.  Monique Bryan has agreed to follow-up with our clinic in 2 weeks. She was informed of the importance of frequent follow-up visits to maximize her success with intensive lifestyle modifications for her multiple health conditions.   Objective:   Blood pressure 137/79, pulse 97, temperature 98.2 F (36.8 C), height 5\' 9"  (1.753 m), weight (!) 356 lb (161.5 kg), SpO2 98 %. Body mass index is 52.57 kg/m.  General: Cooperative, alert, well developed, in no acute distress. HEENT: Conjunctivae and lids unremarkable. Cardiovascular: Regular rhythm.  Lungs: Normal  work of breathing. Neurologic: No focal deficits.   Lab Results  Component Value Date   CREATININE 0.79 12/24/2020   BUN 8 12/24/2020   NA 140 12/24/2020   K 4.8 12/24/2020   CL 101 12/24/2020   CO2 24 12/24/2020   Lab Results  Component Value Date   ALT 19 12/24/2020   AST 14 12/24/2020   ALKPHOS 95 12/24/2020   BILITOT 0.4 12/24/2020   Lab Results  Component Value Date   HGBA1C 5.8 (H) 12/24/2020   HGBA1C 5.8 09/08/2020   Lab Results  Component  Value Date   INSULIN 39.3 (H) 12/24/2020   Lab Results  Component Value Date   TSH 2.47 09/08/2020   Lab Results  Component Value Date   CHOL 133 09/08/2020   HDL 44.00 09/08/2020   LDLCALC 65 09/08/2020   TRIG 119.0 09/08/2020   CHOLHDL 3 09/08/2020   Lab Results  Component Value Date   VD25OH 13.35 (L) 09/08/2020   Lab Results  Component Value Date   WBC 9.8 12/24/2020   HGB 13.8 12/24/2020   HCT 42.9 12/24/2020   MCV 88 12/24/2020   PLT 293 09/04/2020    Attestation Statements:   Reviewed by clinician on day of visit: allergies, medications, problem list, medical history, surgical history, family history, social history, and previous encounter notes.  \Time spent on visit including pre-visit chart review and post-visit care and charting was 40 minutes.   I, Kathlene November, BS, CMA, am acting as transcriptionist for [employer], DO.  I have reviewed the above documentation for accuracy and completeness, and I agree with the above. Marjory Sneddon, D.O.  The [place] was signed into law in 2016 which includes the topic of electronic health records.  This provides immediate access to information in MyChart.  This includes consultation notes, operative notes, office notes, lab results and pathology reports.  If you have any questions about what you read please let us know at your next visit so we can discuss your concerns and take corrective action if need be.  We are right here with you.

## 2021-08-26 ENCOUNTER — Ambulatory Visit (INDEPENDENT_AMBULATORY_CARE_PROVIDER_SITE_OTHER): Payer: BC Managed Care – PPO | Admitting: Family Medicine

## 2021-08-31 ENCOUNTER — Telehealth (INDEPENDENT_AMBULATORY_CARE_PROVIDER_SITE_OTHER): Payer: BC Managed Care – PPO | Admitting: Family Medicine

## 2021-09-02 ENCOUNTER — Ambulatory Visit (INDEPENDENT_AMBULATORY_CARE_PROVIDER_SITE_OTHER): Payer: BC Managed Care – PPO | Admitting: Family Medicine

## 2021-09-08 NOTE — Progress Notes (Signed)
TeleHealth Visit:  This visit was completed with telemedicine (audio/video) technology. Monique Bryan has verbally consented to this TeleHealth visit. The patient is located at home, the provider is located at home. The participants in this visit include the listed provider and patient. The visit was conducted today via MyChart video.  OBESITY Monique Bryan is here to discuss her progress with her obesity treatment plan along with follow-up of her obesity related diagnoses.   Today's visit was # 8 Starting weight: 349 lbs Starting date: 12/24/2020 Weight at last in office visit: 356 lbs on 08/12/21 Total weight loss: 0 lbs at last in office visit on 08/12/21. Today's reported weight:  No weight reported. She thinks she is somewhere around 360 pounds.   Nutrition Plan: keeping a food journal and adhering to recommended goals of 1700-1800 calories and 140 gms protein.  Hunger is poorly controlled. Cravings are poorly controlled.  Current exercise: none  Interim History: Monique Bryan has been off of Wegovy for 3 weeks due to lack of availability.  She has noticed increased hunger.  She did journal for a while but then fell off from this.  She reports frequent fast food. She has been drinking soda and Juicy Juice.  She drinks about 2 bottles of water per day and is working on increasing this.  She prefers in office visits.  Assessment/Plan:  1.  Visceral obesity Her visceral fat rating was 19 on 08/12/2021-above goal (goal is below 12). She notes increased hunger since being off of Wegovy due to national drug shortage.  She has been on Saxenda in the past and actually feels that it managed her hunger better.  Plan: She was provided these instructions via email: Start by taking 0.6 mg daily, if after 3 days you do not have any nausea you can increase the dose to 1.2 mg daily.  If you are still experiencing hunger you may increase to 1.8 mg daily and hold at that dose until your next visit. If you  have questions please send me a message through my chart rather than email. Liraglutide -Weight Management (SAXENDA) 18 MG/3ML SOPN   Sig: Inject 3 mg into the skin daily.   Dispense:  15 mL   Refill:  0    2. Vitamin D Deficiency Vitamin D level was very low at 13 on 09/08/2020.  She is on weekly prescription Vitamin D 50,000 IU.  Lab Results  Component Value Date   VD25OH 13.35 (L) 09/08/2020    Plan: Continue prescription vitamin D 50,000 IU weekly. Check vitamin D level within the next 2 months.   3. Obesity: Current BMI 52.55 Monique Bryan is currently in the action stage of change. As such, her goal is to continue with weight loss efforts.  She has agreed to the Category 3 Plan plus breakfast options. These handouts were sent via email.  We went over some meal planning. As an additional breakfast option she may have 2 eggs, 1 Greek yogurt, and 2 slices of 45-calorie bread. May have a frozen meal for dinner. She will try some 0-calorie sodas over the next few weeks. Increase water to 4 bottles per day.  Exercise goals: No exercise has been prescribed at this time.  Behavioral modification strategies: increasing lean protein intake, decreasing simple carbohydrates, decreasing liquid calories, decreasing eating out, and planning for success.  Monique Bryan has agreed to follow-up with our clinic in 3 weeks.   No orders of the defined types were placed in this encounter.   Medications Discontinued During This  Encounter  Medication Reason   Insulin Pen Needle (BD PEN NEEDLE NANO U/F) 32G X 4 MM MISC Duplicate   Semaglutide-Weight Management 1 MG/0.5ML SOAJ National Drug Shortage   metFORMIN (GLUCOPHAGE) 500 MG tablet Change in therapy   Vitamin D, Ergocalciferol, (DRISDOL) 1.25 MG (50000 UNIT) CAPS capsule Reorder     Meds ordered this encounter  Medications   Liraglutide -Weight Management (SAXENDA) 18 MG/3ML SOPN    Sig: Inject 3 mg into the skin daily.    Dispense:  15 mL     Refill:  0    Order Specific Question:   Supervising Provider    Answer:   Quillian Quince D [id number]   Insulin Pen Needle (BD PEN NEEDLE NANO 2ND GEN) 32G X 4 MM MISC    Sig: Use 1 needle daily to inject medication.    Dispense:  100 each    Refill:  0    Order Specific Question:   Supervising Provider    Answer:   Quillian Quince D [[id number]]   Vitamin D, Ergocalciferol, (DRISDOL) 1.25 MG (50000 UNIT) CAPS capsule    Sig: Take 1 capsule (50,000 Units total) by mouth every 7 (seven) days.    Dispense:  4 capsule    Refill:  0    Order Specific Question:   Supervising Provider    Answer:   Quillian Quince D [[id number]]      Objective:   VITALS: Per patient if applicable, see vitals. GENERAL: Alert and in no acute distress. CARDIOPULMONARY: No increased WOB. Speaking in clear sentences.  PSYCH: Pleasant and cooperative. Speech normal rate and rhythm. Affect is appropriate. Insight and judgement are appropriate. Attention is focused, linear, and appropriate.  NEURO: Oriented as arrived to appointment on time with no prompting.   Lab Results  Component Value Date   CREATININE 0.79 12/24/2020   BUN 8 12/24/2020   NA 140 12/24/2020   K 4.8 12/24/2020   CL 101 12/24/2020   CO2 24 12/24/2020   Lab Results  Component Value Date   ALT 19 12/24/2020   AST 14 12/24/2020   ALKPHOS 95 12/24/2020   BILITOT 0.4 12/24/2020   Lab Results  Component Value Date   HGBA1C 5.8 (H) 12/24/2020   HGBA1C 5.8 09/08/2020   Lab Results  Component Value Date   INSULIN 39.3 (H) 12/24/2020   Lab Results  Component Value Date   TSH 2.47 09/08/2020   Lab Results  Component Value Date   CHOL 133 09/08/2020   HDL 44.00 09/08/2020   LDLCALC 65 09/08/2020   TRIG 119.0 09/08/2020   CHOLHDL 3 09/08/2020   Lab Results  Component Value Date   WBC 9.8 12/24/2020   HGB 13.8 12/24/2020   HCT 42.9 12/24/2020   MCV 88 12/24/2020   PLT 293 09/04/2020   No results found for: "IRON", "TIBC",  "FERRITIN" Lab Results  Component Value Date   VD25OH 13.35 (L) 09/08/2020    Attestation Statements:   Reviewed by clinician on day of visit: allergies, medications, problem list, medical history, surgical history, family history, social history, and previous encounter notes.

## 2021-09-09 ENCOUNTER — Telehealth (INDEPENDENT_AMBULATORY_CARE_PROVIDER_SITE_OTHER): Payer: BC Managed Care – PPO | Admitting: Family Medicine

## 2021-09-09 ENCOUNTER — Encounter (INDEPENDENT_AMBULATORY_CARE_PROVIDER_SITE_OTHER): Payer: Self-pay | Admitting: Family Medicine

## 2021-09-09 DIAGNOSIS — E668 Other obesity: Secondary | ICD-10-CM

## 2021-09-09 DIAGNOSIS — Z6841 Body Mass Index (BMI) 40.0 and over, adult: Secondary | ICD-10-CM

## 2021-09-09 DIAGNOSIS — E65 Localized adiposity: Secondary | ICD-10-CM | POA: Insufficient documentation

## 2021-09-09 DIAGNOSIS — E559 Vitamin D deficiency, unspecified: Secondary | ICD-10-CM | POA: Diagnosis not present

## 2021-09-09 MED ORDER — BD PEN NEEDLE NANO 2ND GEN 32G X 4 MM MISC: 0 refills | Status: DC

## 2021-09-09 MED ORDER — VITAMIN D (ERGOCALCIFEROL) 1.25 MG (50000 UNIT) PO CAPS: 50000.0000 [IU] | ORAL_CAPSULE | ORAL | 0 refills | Status: DC

## 2021-09-09 MED ORDER — SAXENDA 18 MG/3ML ~~LOC~~ SOPN: 3.0000 mg | PEN_INJECTOR | Freq: Every day | SUBCUTANEOUS | 0 refills | Status: DC

## 2021-09-13 ENCOUNTER — Encounter (INDEPENDENT_AMBULATORY_CARE_PROVIDER_SITE_OTHER): Payer: Self-pay

## 2021-09-13 ENCOUNTER — Telehealth (INDEPENDENT_AMBULATORY_CARE_PROVIDER_SITE_OTHER): Payer: Self-pay | Admitting: Family Medicine

## 2021-09-13 NOTE — Telephone Encounter (Signed)
FYI

## 2021-09-13 NOTE — Telephone Encounter (Signed)
[employer] - Prior authorization approved for [employer]. Effective: 09/09/2021 to 01/12/2022. Patient sent approval message via mychart.

## 2021-09-15 ENCOUNTER — Telehealth: Payer: Self-pay | Admitting: Family Medicine

## 2021-09-15 DIAGNOSIS — M5432 Sciatica, left side: Secondary | ICD-10-CM

## 2021-09-15 NOTE — Telephone Encounter (Signed)
Pt would like another epidural. I am unsure how the 3 shots in 6 months rule works and think she may be due for a visit with [place].

## 2021-09-17 NOTE — Telephone Encounter (Signed)
Epidural steroid injection ordered.  Please contact [facility] at [phone] to schedule. °

## 2021-09-17 NOTE — Telephone Encounter (Signed)
Pt notified epidural ordered.

## 2021-09-29 ENCOUNTER — Encounter (INDEPENDENT_AMBULATORY_CARE_PROVIDER_SITE_OTHER): Payer: Self-pay | Admitting: Family Medicine

## 2021-09-29 ENCOUNTER — Ambulatory Visit (INDEPENDENT_AMBULATORY_CARE_PROVIDER_SITE_OTHER): Payer: BC Managed Care – PPO | Admitting: Family Medicine

## 2021-09-29 VITALS — BP 144/61 | HR 81 | Temp 98.0°F | Ht 69.0 in | Wt 356.0 lb

## 2021-09-29 DIAGNOSIS — E559 Vitamin D deficiency, unspecified: Secondary | ICD-10-CM | POA: Insufficient documentation

## 2021-09-29 DIAGNOSIS — Z6841 Body Mass Index (BMI) 40.0 and over, adult: Secondary | ICD-10-CM

## 2021-09-29 DIAGNOSIS — I10 Essential (primary) hypertension: Secondary | ICD-10-CM | POA: Diagnosis not present

## 2021-09-29 DIAGNOSIS — E669 Obesity, unspecified: Secondary | ICD-10-CM

## 2021-09-29 MED ORDER — SAXENDA 18 MG/3ML ~~LOC~~ SOPN: 3.0000 mg | PEN_INJECTOR | Freq: Every day | SUBCUTANEOUS | 0 refills | Status: DC

## 2021-09-30 DIAGNOSIS — M5432 Sciatica, left side: Secondary | ICD-10-CM | POA: Diagnosis not present

## 2021-09-30 DIAGNOSIS — E282 Polycystic ovarian syndrome: Secondary | ICD-10-CM | POA: Diagnosis not present

## 2021-09-30 DIAGNOSIS — I10 Essential (primary) hypertension: Secondary | ICD-10-CM | POA: Diagnosis not present

## 2021-10-04 ENCOUNTER — Other Ambulatory Visit (INDEPENDENT_AMBULATORY_CARE_PROVIDER_SITE_OTHER): Payer: Self-pay | Admitting: Family Medicine

## 2021-10-04 ENCOUNTER — Other Ambulatory Visit (HOSPITAL_COMMUNITY): Payer: Self-pay | Admitting: Surgery

## 2021-10-04 ENCOUNTER — Other Ambulatory Visit: Payer: Self-pay | Admitting: Surgery

## 2021-10-04 NOTE — Progress Notes (Signed)
Chief Complaint:   OBESITY Monique Bryan is here to discuss her progress with her obesity treatment plan along with follow-up of her obesity related diagnoses. Monique Bryan is on the Category 3 Plan with breakfast options and states she is following her eating plan approximately 30% of the time. Monique Bryan states she is doing 0 minutes 0 times per week.  Today's visit was #: 9 Starting weight: 349 lbs Starting date: 12/24/2020 Today's weight: 356 lbs Today's date: 09/29/2021 Total lbs lost to date: 0 Total lbs lost since last in-office visit: 0  Interim History: Monique Bryan has sone well with maintaining her weight despite being off her GLP-1. She is getting ready to have an appointment to have weight loss surgery.   Subjective:   1. Vitamin D deficiency Monique Bryan is on OTC vitamin D, and her last vitamin D level was still low.  2. Hypertension, unspecified type Monique Bryan's blood pressure is elevated.  She feels this is related to work stress.  She has had intermittent elevated blood pressure readings.  Assessment/Plan:   1. Vitamin D deficiency Monique Bryan is to get her labs [facility] at [facility] tomorrow, and we will follow-up with results.  2. Hypertension, unspecified type Monique Bryan is to continue to work on her diet, and she is to follow-up with her PCP to address her elevated blood pressure further.  3. Obesity, Current BMI 52.6 Monique Bryan is currently in the action stage of change. As such, her goal is to continue with weight loss efforts. She has agreed to the Category 3 Plan.   We discussed various medication options to help Monique Bryan with her weight loss efforts and we both agreed to continue Saxenda, and we will resend prescription to the pharmacy. (Patient is to start at 0.6 mg for 1 week, then increase to 1.2 mg thereafter).  - Liraglutide -Weight Management (SAXENDA) 18 MG/3ML SOPN; Inject 3 mg into the skin daily.  Dispense: 15 mL; Refill: 0  Behavioral modification strategies: increasing lean  protein intake.  Monique Bryan has agreed to follow-up with our clinic in 2 to 3 weeks via Telehealth with [facility], FNP-C. She was informed of the importance of frequent follow-up visits to maximize her success with intensive lifestyle modifications for her multiple health conditions.   Objective:   Blood pressure (!) 144/61, pulse 81, temperature 98 F (36.7 C), height 5\' 9"  (1.753 m), weight (!) 356 lb (161.5 kg). Body mass index is 52.57 kg/m.  General: Cooperative, alert, well developed, in no acute distress. HEENT: Conjunctivae and lids unremarkable. Cardiovascular: Regular rhythm.  Lungs: Normal work of breathing. Neurologic: No focal deficits.   Lab Results  Component Value Date   CREATININE 0.79 12/24/2020   BUN 8 12/24/2020   NA 140 12/24/2020   K 4.8 12/24/2020   CL 101 12/24/2020   CO2 24 12/24/2020   Lab Results  Component Value Date   ALT 19 12/24/2020   AST 14 12/24/2020   ALKPHOS 95 12/24/2020   BILITOT 0.4 12/24/2020   Lab Results  Component Value Date   HGBA1C 5.8 (H) 12/24/2020   HGBA1C 5.8 09/08/2020   Lab Results  Component Value Date   INSULIN 39.3 (H) 12/24/2020   Lab Results  Component Value Date   TSH 2.47 09/08/2020   Lab Results  Component Value Date   CHOL 133 09/08/2020   HDL 44.00 09/08/2020   LDLCALC 65 09/08/2020   TRIG 119.0 09/08/2020   CHOLHDL 3 09/08/2020   Lab Results  Component Value Date  VD25OH 13.35 (L) 09/08/2020   Lab Results  Component Value Date   WBC 9.8 12/24/2020   HGB 13.8 12/24/2020   HCT 42.9 12/24/2020   MCV 88 12/24/2020   PLT 293 09/04/2020   No results found for: "IRON", "TIBC", "FERRITIN"  Attestation Statements:   Reviewed by clinician on day of visit: allergies, medications, problem list, medical history, surgical history, family history, social history, and previous encounter notes.   I, Burt Knack, am acting as transcriptionist for Quillian Quince, MD.  I have reviewed the above  documentation for accuracy and completeness, and I agree with the above. -  Quillian Quince, MD

## 2021-10-05 NOTE — Telephone Encounter (Signed)
Can you please check other pharmacies (start with [facility])

## 2021-10-05 NOTE — Telephone Encounter (Signed)
Please advise the patient of this and there is no other similar medicine until supplies improve.

## 2021-10-06 NOTE — Telephone Encounter (Signed)
Called patient, no answer and voicemail is full.  

## 2021-10-06 NOTE — Telephone Encounter (Signed)
Patient informed of provider's message and verbalized understanding.

## 2021-10-12 ENCOUNTER — Ambulatory Visit
Admission: RE | Admit: 2021-10-12 | Discharge: 2021-10-12 | Disposition: A | Discharge status: Home / Self Care | Payer: BC Managed Care – PPO | Source: Ambulatory Visit | Attending: Family Medicine | Admitting: Family Medicine

## 2021-10-12 DIAGNOSIS — M5432 Sciatica, left side: Secondary | ICD-10-CM

## 2021-10-12 DIAGNOSIS — M5417 Radiculopathy, lumbosacral region: Secondary | ICD-10-CM | POA: Diagnosis not present

## 2021-10-12 MED ORDER — IOPAMIDOL (ISOVUE-M 200) INJECTION 41%
1.0000 mL | Freq: Once | INTRAMUSCULAR | Status: AC
  Administered 2021-10-12: 1 mL via EPIDURAL

## 2021-10-12 MED ORDER — METHYLPREDNISOLONE ACETATE 40 MG/ML INJ SUSP (RADIOLOG
80.0000 mg | Freq: Once | INTRAMUSCULAR | Status: AC
  Administered 2021-10-12: 80 mg via EPIDURAL

## 2021-10-12 NOTE — Discharge Instructions (Signed)

## 2021-10-15 ENCOUNTER — Encounter (HOSPITAL_COMMUNITY): Payer: Self-pay

## 2021-10-15 ENCOUNTER — Inpatient Hospital Stay (HOSPITAL_COMMUNITY): Admission: RE | Admit: 2021-10-15 | Payer: BC Managed Care – PPO | Source: Ambulatory Visit

## 2021-10-19 NOTE — Progress Notes (Deleted)
TeleHealth Visit:  This visit was completed with telemedicine (audio/video) technology. Monique Bryan has verbally consented to this TeleHealth visit. The patient is located at home, the provider is located at home. The participants in this visit include the listed provider and patient. The visit was conducted today via MyChart video.  OBESITY Monique Bryan is here to discuss her progress with her obesity treatment plan along with follow-up of her obesity related diagnoses.   Today's visit was # 10 Starting weight: 349 lbs Starting date: 12/24/2020 Weight at last in office visit: 356 lbs on 09/29/21 Total weight loss: 0 lbs at last in office visit on 09/29/21. Today's reported weight: *** lbs No weight reported.  Nutrition Plan: the Category 3 Plan.  Hunger is {EWCONTROLASSESSMENT:24261}. Cravings are {EWCONTROLASSESSMENT:24261}.  Current exercise: {exercise types:16438}  Interim History: ***  Assessment/Plan:  1. ***  2. ***  3. ***  Obesity: Current BMI *** Monique Bryan {CHL AMB IS/IS NOT:210130109} currently in the action stage of change. As such, her goal is to {MWMwtloss#1:210800005}.  She has agreed to {MWMwtlossportion/plan2:23431}.   Exercise goals: {MWM EXERCISE RECS:23473}  Behavioral modification strategies: {MWMwtlossdietstrategies3:23432}.  Monique Bryan has agreed to follow-up with our clinic in {NUMBER 1-10:22536} weeks.   No orders of the defined types were placed in this encounter.   There are no discontinued medications.   No orders of the defined types were placed in this encounter.     Objective:   VITALS: Per patient if applicable, see vitals. GENERAL: Alert and in no acute distress. CARDIOPULMONARY: No increased WOB. Speaking in clear sentences.  PSYCH: Pleasant and cooperative. Speech normal rate and rhythm. Affect is appropriate. Insight and judgement are appropriate. Attention is focused, linear, and appropriate.  NEURO: Oriented as arrived to appointment  on time with no prompting.   Lab Results  Component Value Date   CREATININE 0.79 12/24/2020   BUN 8 12/24/2020   NA 140 12/24/2020   K 4.8 12/24/2020   CL 101 12/24/2020   CO2 24 12/24/2020   Lab Results  Component Value Date   ALT 19 12/24/2020   AST 14 12/24/2020   ALKPHOS 95 12/24/2020   BILITOT 0.4 12/24/2020   Lab Results  Component Value Date   HGBA1C 5.8 (H) 12/24/2020   HGBA1C 5.8 09/08/2020   Lab Results  Component Value Date   INSULIN 39.3 (H) 12/24/2020   Lab Results  Component Value Date   TSH 2.47 09/08/2020   Lab Results  Component Value Date   CHOL 133 09/08/2020   HDL 44.00 09/08/2020   LDLCALC 65 09/08/2020   TRIG 119.0 09/08/2020   CHOLHDL 3 09/08/2020   Lab Results  Component Value Date   WBC 9.8 12/24/2020   HGB 13.8 12/24/2020   HCT 42.9 12/24/2020   MCV 88 12/24/2020   PLT 293 09/04/2020   No results found for: "IRON", "TIBC", "FERRITIN" Lab Results  Component Value Date   VD25OH 13.35 (L) 09/08/2020    Attestation Statements:   Reviewed by clinician on day of visit: allergies, medications, problem list, medical history, surgical history, family history, social history, and previous encounter notes.  ***(delete if time-based billing not used) Time spent on visit including the items listed below was *** minutes.  -preparing to see the patient (e.g., review of tests, history, previous notes) -obtaining and/or reviewing separately obtained history -counseling and educating the patient/family/caregiver -documenting clinical information in the electronic or other health record -ordering medications, tests, or procedures -independently interpreting results and communicating results to the patient/  family/caregiver -referring and communicating with other health care professionals  -care coordination

## 2021-10-20 ENCOUNTER — Telehealth (INDEPENDENT_AMBULATORY_CARE_PROVIDER_SITE_OTHER): Payer: BC Managed Care – PPO | Admitting: Family Medicine

## 2021-10-27 ENCOUNTER — Encounter (INDEPENDENT_AMBULATORY_CARE_PROVIDER_SITE_OTHER): Payer: Self-pay

## 2021-11-11 ENCOUNTER — Ambulatory Visit: Payer: BC Managed Care – PPO | Admitting: Dietician

## 2021-11-16 ENCOUNTER — Ambulatory Visit: Payer: BC Managed Care – PPO | Admitting: Dietician

## 2021-11-23 ENCOUNTER — Encounter: Payer: BC Managed Care – PPO | Admitting: Dietician

## 2021-11-30 ENCOUNTER — Encounter: Payer: Self-pay | Admitting: Dietician

## 2021-11-30 ENCOUNTER — Encounter: Payer: BC Managed Care – PPO | Attending: Internal Medicine | Admitting: Dietician

## 2021-11-30 DIAGNOSIS — E669 Obesity, unspecified: Secondary | ICD-10-CM

## 2021-11-30 NOTE — Progress Notes (Signed)
Nutrition Assessment for Bariatric Surgery Medical Nutrition Therapy Appt Start Time: 11:20    End Time: 12:21  Patient was seen on 11/30/2021 for Pre-Operative Nutrition Assessment. Letter of approval faxed to [facility] bariatric surgery program coordinator on 11/30/2021.   Referral stated Supervised Weight Loss (SWL) visits needed: 0  Not cleared at this time:  Pt to follow up for minimum of one more visit to assist pt with progressing through stages of change/further nutrition education. RD advised pt that this follow up visit is not mandated through insurance. Pt verbalized agreement.   Planned surgery: Sleeve Gastrectomy    NUTRITION ASSESSMENT   Anthropometrics  Start weight at [facility]: 360.3 lbs (date: 11/30/2021)  Height: 69.5 in BMI: 52.44 kg/m2     Clinical  Medical hx: Sleep apnea, HTN Medications: Wagovy, B12, Vit D  Labs: A1C 5.8; B12 223 Notable signs/symptoms: none noted Any previous deficiencies? No  Micronutrient Nutrition Focused Physical Exam: Hair: No issues observed Eyes: No issues observed Mouth: No issues observed Neck: No issues observed Nails: No issues observed Skin: No issues observed  Lifestyle & Dietary Hx  Ms. Patient lives with her daughter and her mother. The pt performs the food shopping and the pt prepares the meals. She reports that she typically skips or misses 7 out of 21 possible meals per week. She may have 10-15 meals per week that are take-out or at a restaurant.  Patient works as Dietitian. She admits to binge eating before and has felt shame and/or guilt after eating too much food.  She denies having used laxatives or vomiting to facilitate weight loss. She admits to emotional eating during times of stress. She states that she knows the difference between hunger and thirst and can tell when she is full. Pt states she may eat too much if she has not eaten all day.  Pt states she does not think she waits long enough  to decide if she is full or satisfied. Pt states she loves sweets and prefers sweetened beverages to plain water.  Pt agreeable to giving up sodas and drinking more water. Pt agreeable to increasing physical activity.  Physical Activity: walking twice a week, 2 miles; ADLs  Sleep Hygiene: duration and quality: 9 hours of good quality sleep  Current Patient Perceived Stress Level as stated by pt on a scale of 1-10:  7      Stress Management Techniques: going for a walk  Fruit servings per week: 5-6 Non starchy vegetable servings per week: 2-3 Whole Grains per week: 1-2  24-Hr Dietary Recall First Meal: skip, water Snack: cookies Second Meal: taco bell cravings box with soda Snack:  Third Meal: [employer] or cook spaghetti Snack: cookies Beverages: water, juicy juice, cherry pepsi  Alcoholic beverages per week: 0   Estimated Energy Needs Calories: 1600   NUTRITION DIAGNOSIS  Overweight/obesity ([place]-3.3) related to past poor dietary habits and physical inactivity as evidenced by patient w/ planned Sleeve surgery following dietary guidelines for continued weight loss.    NUTRITION INTERVENTION  Nutrition counseling (C-1) and education (E-2) to facilitate bariatric surgery goals.  Educated pt on micronutrient deficiencies post surgery and strategies to mitigate that risk   Pre-Op Goals Reviewed with the Patient Track food and beverage intake (pen and paper, MyFitness Pal, Baritastic app, etc.) Make healthy food choices while monitoring portion sizes Consume 3 meals per day or try to eat every 3-5 hours Avoid concentrated sugars and fried foods Keep sugar & fat in the  single digits per serving on food labels Practice CHEWING your food (aim for applesauce consistency) Practice not drinking 15 minutes before, during, and 30 minutes after each meal and snack Avoid all carbonated beverages (ex: soda, sparkling beverages)  Limit caffeinated beverages (ex: coffee, tea, energy  drinks) Avoid all sugar-sweetened beverages (ex: regular soda, sports drinks)  Avoid alcohol  Aim for 64-100 ounces of FLUID daily (with at least half of fluid intake being plain water)  Aim for at least 60-80 grams of PROTEIN daily Look for a liquid protein source that contains ?15 g protein and ?5 g carbohydrate (ex: shakes, drinks, shots) Make a list of non-food related activities Physical activity is an important part of a healthy lifestyle so keep it moving! The goal is to reach 150 minutes of exercise per week, including cardiovascular and weight baring activity.  *Goals that are bolded indicate the pt would like to start working towards these  Handouts Provided Include  Bariatric Surgery handouts (Nutrition Visits, Pre-Op Goals, Protein Shakes, Vitamins & Minerals)  Learning Style & Readiness for Change Teaching method utilized: Visual & Auditory  Demonstrated degree of understanding via: Teach Back  Readiness Level: contemplative Barriers to learning/adherence to lifestyle change: previous habits  RD's Notes for Next Visit    MONITORING & EVALUATION Dietary intake, weekly physical activity, body weight, and pre-op goals reached at next nutrition visit.    Next Steps  Patient is to follow up at [facility] in 3 weeks for further nutrition education.

## 2021-12-20 ENCOUNTER — Ambulatory Visit (HOSPITAL_COMMUNITY): Payer: BC Managed Care – PPO | Admitting: Licensed Clinical Social Worker

## 2021-12-21 ENCOUNTER — Ambulatory Visit: Payer: BC Managed Care – PPO | Admitting: Dietician

## 2021-12-21 ENCOUNTER — Ambulatory Visit (INDEPENDENT_AMBULATORY_CARE_PROVIDER_SITE_OTHER): Payer: BC Managed Care – PPO | Admitting: Family Medicine

## 2021-12-21 VITALS — BP 124/78 | HR 78 | Ht 69.5 in | Wt 365.0 lb

## 2021-12-21 DIAGNOSIS — S29012A Strain of muscle and tendon of back wall of thorax, initial encounter: Secondary | ICD-10-CM

## 2021-12-21 MED ORDER — TIZANIDINE HCL 4 MG PO TABS: 4.0000 mg | ORAL_TABLET | Freq: Three times a day (TID) | ORAL | 1 refills | Status: DC | PRN

## 2021-12-21 NOTE — Progress Notes (Signed)
   I, Peterson Lombard, LAT, ATC acting as a scribe for [name], MD.  Monique Bryan is a 31 y.o. female who presents to [facility] today for cont'd thoracic and lumbar back pain. Pt was last seen by Dr. Georgina Snell on 02/23/22 for worsening lumbosacral radiculopathy L-side and S1 dermatomal pattern and was taught HEP, prescribed gabapentin, and a lumbar MRI was ordered. Based on MRI findings, lumbar ESI was ordered and later performed on 03/04/21. Pt called the office on 04/19/21 c/o returning LBP and a repeat ESI was ordered, and later performed on 04/21/21 and again on 06/01/21. Pt had another ESI on 10/12/21. Today, pt reports new thoracic back pain that started last week. Pt went to pull up a pair of biker shorts and felt like she strained something. Pt locates pain to the R side of this thoracic back. Pt notes this pain has pretty much resolved over the weekend. Pt reports LBP and radicular symptoms has returned over this past week.   Treatments tried: muscle relaxer, IBU  Dx imaging: 02/28/21 L-spine MRI 09/05/20 L-spine XR  Pertinent review of systems: No fevers or chills  Relevant historical information: Obesity.  Depression.  Hypertension.   Exam:  BP 124/78   Pulse 78   Ht 5' 9.5" (1.765 m)   Wt (!) 365 lb (165.6 kg)   SpO2 98%   BMI 53.13 kg/m  General: Well Developed, well nourished, and in no acute distress.   MSK: T-spine: Normal appearing Nontender midline. Tender palpation left thoracic paraspinal musculature and rhomboid musculature. Normal scapular motion.  Some pain with scapular retraction present.      Assessment and Plan: 31 y.o. female with left thoracic paraspinal muscle spasm and dysfunction and left rhomboid muscle spasm and dysfunction.  Pain is already improving which is good news.  Plan for watchful waiting with heating pad and TENS unit.  Also recommend tizanidine as needed.  Check back as needed.  Consider physical therapy  if worsening or not improving.   PDMP not reviewed this encounter. No orders of the defined types were placed in this encounter.  Meds ordered this encounter  Medications   tiZANidine (ZANAFLEX) 4 MG tablet    Sig: Take 1 tablet (4 mg total) by mouth every 8 (eight) hours as needed for muscle spasms.    Dispense:  30 tablet    Refill:  1     Discussed warning signs or symptoms. Please see discharge instructions. Patient expresses understanding.   The above documentation has been reviewed and is accurate and complete [name], M.D.

## 2021-12-21 NOTE — Patient Instructions (Signed)
Thank you for coming in today.   Recheck as needed.   PT could help if needed.   Heat could help.   Use the muscle relaxer as needed.   TENS UNIT: This is helpful for muscle pain and spasm.   Search and Purchase a TENS 7000 2nd edition at  www.tenspros.com or www.[email or web address] It should be less than $30.     TENS unit instructions: Do not shower or bathe with the unit on Turn the unit off before removing electrodes or batteries If the electrodes lose stickiness add a drop of water to the electrodes after they are disconnected from the unit and place on plastic sheet. If you continued to have difficulty, call the TENS unit company to purchase more electrodes. Do not apply lotion on the skin area prior to use. Make sure the skin is clean and dry as this will help prolong the life of the electrodes. After use, always check skin for unusual red areas, rash or other skin difficulties. If there are any skin problems, does not apply electrodes to the same area. Never remove the electrodes from the unit by pulling the wires. Do not use the TENS unit or electrodes other than as directed. Do not change electrode placement without consultating your therapist or physician. Keep 2 fingers with between each electrode. Wear time ratio is 2:1, on to off times.    For example on for 30 minutes off for 15 minutes and then on for 30 minutes off for 15 minutes

## 2021-12-28 ENCOUNTER — Encounter: Payer: Self-pay | Admitting: Dietician

## 2021-12-28 ENCOUNTER — Encounter: Payer: BC Managed Care – PPO | Attending: Internal Medicine | Admitting: Dietician

## 2021-12-28 DIAGNOSIS — E669 Obesity, unspecified: Secondary | ICD-10-CM | POA: Diagnosis not present

## 2021-12-28 NOTE — Progress Notes (Signed)
Supervised Weight Loss Visit Bariatric Nutrition Education Appt Start Time: 10:10    End Time: 10:39  Planned surgery: Sleeve Gastrectomy  Referral stated Supervised Weight Loss (SWL) visits needed: 0  Not cleared at this time:  Pt to follow up for minimum of one more visit to assist pt with progressing through stages of change/further nutrition education. RD advised pt that this follow up visit is not mandated through insurance. Pt verbalized agreement.   NUTRITION ASSESSMENT   Anthropometrics  Start weight at [facility]: 360.3 lbs (date: 11/30/2021)  Height: 69.5 in Weight today: 356.2 lbs. BMI: 51.85 kg/m2     Clinical  Medical hx: Sleep apnea, HTN Medications: Wagovy, B12, Vit D  Labs: A1C 5.8; B12 223 Notable signs/symptoms: none noted Any previous deficiencies? No  Lifestyle & Dietary Hx  Pt states she has tried to cut back on sodas. Pt states she is drinking a lot of Juicy Juice, stating that her daughter likes it, and it is a sub for the soda. Pt stated that she likes to go to [facility] for the "Pink Drink".  Dietitian showed pt the sugar content of the pink drink. Pt agreeable to drinking sugarless beverages. Pt agreeable to returning in 3 weeks to show more progress toward pt goals. Pt states she is eating one meal a day, stating she is hungry when she eats dinner. Pt states she still needs to get labs drawn.  Estimated daily fluid intake:  oz Supplements:  Current average weekly physical activity:   24-Hr Dietary Recall First Meal: skip Snack:  Second Meal: fruit Snack:  Third Meal: meatloaf, cabbage, mac and cheese, BBQ chicken or tacos or spaghetti, or chicken alfredo or chicken and rice, squash, zucchini, squash Snack:  Beverages: water, coffee, juicy juice, grape sparkling water, sparkling ice  Estimated Energy Needs Calories: 1600  NUTRITION DIAGNOSIS  Overweight/obesity ([place]-3.3) related to past poor dietary habits and physical inactivity as evidenced by  patient w/ planned sleeve surgery following dietary guidelines for continued weight loss.  NUTRITION INTERVENTION  Nutrition counseling (C-1) and education (E-2) to facilitate bariatric surgery goals.  Why you need complex carbohydrates: Whole grains and other complex carbohydrates are required to have a healthy diet. Whole grains provide fiber which can help with blood glucose levels and help keep you satiated. Fruits and starchy vegetables provide essential vitamins and minerals required for immune function, eyesight support, brain support, bone density, wound healing and many other functions within the body. According to the current evidenced based 2020-2025 Dietary Guidelines for Americans, complex carbohydrates are part of a healthy eating pattern which is associated with a decreased risk for type 2 diabetes, cancers, and cardiovascular disease.   Pt agreeable to return to show change in her eating patterns/habits.  Pre-Op Goals Progress & New Goals Eat breakfast and take something to work for lunch.  Go to the grocery store to get items for meal prepping  Consume 3 meals per day or try to eat every 3-5 hours Avoid concentrated sugars and fried foods Avoid all sugar-sweetened beverages (ex: regular soda, sports drinks)  Physical activity is an important part of a healthy lifestyle so keep it moving! The goal is to reach 150 minutes of exercise per week, including cardiovascular and weight baring activity.  Handouts Provided Include   Learning Style & Readiness for Change Teaching method utilized: Visual & Auditory  Demonstrated degree of understanding via: Teach Back  Readiness Level: contemplative Barriers to learning/adherence to lifestyle change: previous habits  RD's Notes for  next Visit  Progress toward patient's chosen goals Check for new labs   MONITORING & EVALUATION Dietary intake, weekly physical activity, body weight.   Next Steps  Patient is to follow up at [place] [facility] in 3  weeks for follow-up and further nutrition education.

## 2021-12-29 DIAGNOSIS — N9089 Other specified noninflammatory disorders of vulva and perineum: Secondary | ICD-10-CM | POA: Diagnosis not present

## 2021-12-29 DIAGNOSIS — Z113 Encounter for screening for infections with a predominantly sexual mode of transmission: Secondary | ICD-10-CM | POA: Diagnosis not present

## 2021-12-29 DIAGNOSIS — L918 Other hypertrophic disorders of the skin: Secondary | ICD-10-CM | POA: Diagnosis not present

## 2021-12-29 DIAGNOSIS — Z6841 Body Mass Index (BMI) 40.0 and over, adult: Secondary | ICD-10-CM | POA: Diagnosis not present

## 2021-12-29 DIAGNOSIS — Z01419 Encounter for gynecological examination (general) (routine) without abnormal findings: Secondary | ICD-10-CM | POA: Diagnosis not present

## 2022-01-11 DIAGNOSIS — F32A Depression, unspecified: Secondary | ICD-10-CM | POA: Diagnosis not present

## 2022-01-18 ENCOUNTER — Ambulatory Visit (INDEPENDENT_AMBULATORY_CARE_PROVIDER_SITE_OTHER): Payer: BC Managed Care – PPO

## 2022-01-18 ENCOUNTER — Encounter: Payer: Self-pay | Admitting: Emergency Medicine

## 2022-01-18 ENCOUNTER — Ambulatory Visit
Admission: EM | Admit: 2022-01-18 | Discharge: 2022-01-18 | Disposition: A | Discharge status: Home / Self Care | Payer: BC Managed Care – PPO | Attending: Internal Medicine | Admitting: Internal Medicine

## 2022-01-18 DIAGNOSIS — R059 Cough, unspecified: Secondary | ICD-10-CM | POA: Diagnosis not present

## 2022-01-18 DIAGNOSIS — R0602 Shortness of breath: Secondary | ICD-10-CM | POA: Diagnosis not present

## 2022-01-18 DIAGNOSIS — J029 Acute pharyngitis, unspecified: Secondary | ICD-10-CM | POA: Insufficient documentation

## 2022-01-18 DIAGNOSIS — Z1152 Encounter for screening for COVID-19: Secondary | ICD-10-CM | POA: Diagnosis not present

## 2022-01-18 DIAGNOSIS — Z79899 Other long term (current) drug therapy: Secondary | ICD-10-CM | POA: Diagnosis not present

## 2022-01-18 DIAGNOSIS — R42 Dizziness and giddiness: Secondary | ICD-10-CM | POA: Insufficient documentation

## 2022-01-18 DIAGNOSIS — J069 Acute upper respiratory infection, unspecified: Secondary | ICD-10-CM

## 2022-01-18 DIAGNOSIS — Z7952 Long term (current) use of systemic steroids: Secondary | ICD-10-CM | POA: Insufficient documentation

## 2022-01-18 LAB — POCT RAPID STREP A (OFFICE): Rapid Strep A Screen: NEGATIVE

## 2022-01-18 LAB — RESP PANEL BY RT-PCR (RSV, FLU A&B, COVID)  RVPGX2
Influenza A by PCR: NEGATIVE
Influenza B by PCR: NEGATIVE
Resp Syncytial Virus by PCR: NEGATIVE
SARS Coronavirus 2 by RT PCR: NEGATIVE

## 2022-01-18 MED ORDER — ALBUTEROL SULFATE (2.5 MG/3ML) 0.083% IN NEBU
2.5000 mg | INHALATION_SOLUTION | Freq: Once | RESPIRATORY_TRACT | Status: AC
  Administered 2022-01-18: 2.5 mg via RESPIRATORY_TRACT

## 2022-01-18 MED ORDER — PREDNISONE 10 MG (21) PO TBPK: ORAL_TABLET | Freq: Every day | ORAL | 0 refills | Status: DC

## 2022-01-18 MED ORDER — ALBUTEROL SULFATE (2.5 MG/3ML) 0.083% IN NEBU: 2.5000 mg | INHALATION_SOLUTION | Freq: Four times a day (QID) | RESPIRATORY_TRACT | 12 refills | Status: DC | PRN

## 2022-01-18 MED ORDER — BENZONATATE 100 MG PO CAPS: 100.0000 mg | ORAL_CAPSULE | Freq: Three times a day (TID) | ORAL | 0 refills | Status: DC | PRN

## 2022-01-18 MED ORDER — ALBUTEROL SULFATE HFA 108 (90 BASE) MCG/ACT IN AERS: 1.0000 | INHALATION_SPRAY | Freq: Four times a day (QID) | RESPIRATORY_TRACT | 0 refills | Status: DC | PRN

## 2022-01-18 NOTE — ED Provider Notes (Signed)
[facility]    CSN: [id number] Arrival date & time: 01/18/22  1006      History   Chief Complaint Chief Complaint  Patient presents with   Sore Throat   Cough   Shortness of Breath    HPI Monique Bryan is a 31 y.o. female.   Patient presents with cough, shortness of breath, dizziness, sore throat, runny nose.  Upper respiratory symptoms, cough, sore throat started about 6 days ago.  She developed shortness of breath and dizziness this morning.  Denies chest pain, ear pain, nausea, vomiting, diarrhea, abdominal pain.  Patient has taken ibuprofen and leftover Promethazine DM cough medication with minimal improvement of symptoms.  Denies history of asthma or COPD and patient is not a smoker.   Sore Throat  Cough Shortness of Breath   Past Medical History:  Diagnosis Date   Back pain    Edema of both lower extremities    Hypertension    Medical history non-contributory    Obesity    PCOS (polycystic ovarian syndrome)    Sleep apnea    SOB (shortness of breath) on exertion     Patient Active Problem List   Diagnosis Date Noted   Vitamin D deficiency 09/29/2021   Hypertension 09/29/2021   Visceral obesity 09/09/2021   Encounter for general adult medical examination with abnormal findings 09/08/2020   Morbid obesity with BMI of 50.0-59.9, adult ([place]) 09/08/2020   Vitamin D deficiency disease 09/08/2020   Obesity 01/11/2019   Depression 01/11/2019   PCOS (polycystic ovarian syndrome) 01/11/2019   PIH (pregnancy induced hypertension), antepartum 03/26/2018    Past Surgical History:  Procedure Laterality Date   DILATATION & CURETTAGE/HYSTEROSCOPY WITH MYOSURE N/A 01/21/2019   Procedure: DILATATION & CURETTAGE/HYSTEROSCOPY WITH MYOSURE;  Surgeon: Tyson Dense, MD;  Location: [place];  Service: Gynecology;  Laterality: N/A;   INTRAUTERINE DEVICE (IUD) INSERTION N/A 01/21/2019   Procedure: INTRAUTERINE DEVICE (IUD) INSERTION;  Surgeon: Tyson Dense, MD;  Location: [place];  Service: Gynecology;  Laterality: N/A;  Mirena    TONSILLECTOMY      OB History     Gravida  1   Para  1   Term  1   Preterm      AB      Living  1      SAB      IAB      Ectopic      Multiple  0   Live Births  1            Home Medications    Prior to Admission medications   Medication Sig Start Date End Date Taking? Authorizing Provider  albuterol (PROVENTIL) (2.5 MG/3ML) 0.083% nebulizer solution Take 3 mLs (2.5 mg total) by nebulization every 6 (six) hours as needed for wheezing or shortness of breath. 01/18/22  Yes Tamlyn Sides, Michele Rockers, FNP  albuterol (VENTOLIN HFA) 108 (90 Base) MCG/ACT inhaler Inhale 1-2 puffs into the lungs every 6 (six) hours as needed for wheezing or shortness of breath. 01/18/22  Yes Yomira Flitton, Hildred Alamin E, FNP  benzonatate (TESSALON) 100 MG capsule Take 1 capsule (100 mg total) by mouth every 8 (eight) hours as needed for cough. 01/18/22  Yes Richie Bonanno, Hildred Alamin E, FNP  predniSONE (STERAPRED UNI-PAK 21 TAB) 10 MG (21) TBPK tablet Take by mouth daily. Take 6 tabs by mouth daily  for 2 days, then 5 tabs for 2 days, then 4 tabs for 2 days, then 3 tabs for 2  days, 2 tabs for 2 days, then 1 tab by mouth daily for 2 days 01/18/22  Yes Macsen Nuttall, [place] E, [place]  Cholecalciferol (VITAMIN D3) 50 MCG (2000 UT) CAPS Take 1 capsule (2,000 Units total) by mouth daily. 08/09/21   Janith Lima, MD  gabapentin (NEURONTIN) 300 MG capsule Take 1 capsule (300 mg total) by mouth 3 (three) times daily. 02/23/21   Gregor Hams, MD  Insulin Pen Needle (BD PEN NEEDLE NANO 2ND GEN) 32G X 4 MM MISC Use 1 needle daily to inject medication. 09/09/21   [place], Joneen Boers, FNP  lidocaine (XYLOCAINE) 2 % solution Use as directed 15 mLs in the mouth or throat every 4 (four) hours as needed for mouth pain. 05/03/21   Hazel Sams, PA-C  promethazine-dextromethorphan (PROMETHAZINE-DM) 6.25-15 MG/5ML syrup Take 5 mLs by mouth 4 (four) times daily as needed for  cough. 05/03/21   Hazel Sams, PA-C  tiZANidine (ZANAFLEX) 4 MG tablet Take 1 tablet (4 mg total) by mouth every 8 (eight) hours as needed for muscle spasms. 12/21/21   Gregor Hams, MD  traMADol (ULTRAM) 50 MG tablet Take 1 tablet (50 mg total) by mouth every 8 (eight) hours as needed for severe pain. 03/02/21   Gregor Hams, MD  vitamin B-12 (CYANOCOBALAMIN) 1000 MCG tablet Take 1 tablet (1,000 mcg total) by mouth daily. Annual appt due in June must see provider for future refills 07/21/21   Janith Lima, MD  Vitamin D, Ergocalciferol, (DRISDOL) 1.25 MG (50000 UNIT) CAPS capsule Take 1 capsule (50,000 Units total) by mouth every 7 (seven) days. 09/09/21   [place], Joneen Boers, FNP    Family History Family History  Problem Relation Age of Onset   Sickle cell trait Mother     Social History Social History   Tobacco Use   Smoking status: Never   Smokeless tobacco: Never  Vaping Use   Vaping Use: Never used  Substance Use Topics   Alcohol use: No   Drug use: Not Currently    Types: Marijuana     Allergies   Patient has no known allergies.   Review of Systems Review of Systems Per HPI  Physical Exam Triage Vital Signs ED Triage Vitals  Enc Vitals Group     BP 01/18/22 1106 112/74     Pulse Rate 01/18/22 1102 85     Resp 01/18/22 1102 17     Temp 01/18/22 1102 98 F (36.7 C)     Temp src --      SpO2 01/18/22 1102 97 %     Weight --      Height --      Head Circumference --      Peak Flow --      Pain Score 01/18/22 1105 10     Pain Loc --      Pain Edu? --      Excl. in [place]? --    No data found.  Updated Vital Signs BP 112/74   Pulse 85   Temp 98 F (36.7 C)   Resp 17   SpO2 97%   Visual Acuity Right Eye Distance:   Left Eye Distance:   Bilateral Distance:    Right Eye Near:   Left Eye Near:    Bilateral Near:     Physical Exam Constitutional:      General: She is not in acute distress.    Appearance: Normal appearance. She is not  toxic-appearing or diaphoretic.  HENT:     Head: Normocephalic and atraumatic.     Right Ear: Tympanic membrane and ear canal normal.     Left Ear: Tympanic membrane and ear canal normal.     Nose: Congestion present.     Mouth/Throat:     Mouth: Mucous membranes are moist.     Pharynx: Posterior oropharyngeal erythema present.  Eyes:     Extraocular Movements: Extraocular movements intact.     Conjunctiva/sclera: Conjunctivae normal.     Pupils: Pupils are equal, round, and reactive to light.  Cardiovascular:     Rate and Rhythm: Normal rate and regular rhythm.     Pulses: Normal pulses.     Heart sounds: Normal heart sounds.  Pulmonary:     Effort: Pulmonary effort is normal. No respiratory distress.     Breath sounds: Normal breath sounds. No stridor. No wheezing, rhonchi or rales.  Abdominal:     General: Abdomen is flat. Bowel sounds are normal.     Palpations: Abdomen is soft.  Musculoskeletal:        General: Normal range of motion.     Cervical back: Normal range of motion.  Skin:    General: Skin is warm and dry.  Neurological:     General: No focal deficit present.     Mental Status: She is alert and oriented to person, place, and time. Mental status is at baseline.  Psychiatric:        Mood and Affect: Mood normal.        Behavior: Behavior normal.      UC Treatments / Results  Labs (all labs ordered are listed, but only abnormal results are displayed) Labs Reviewed  RESP PANEL BY RT-PCR (RSV, FLU A&B, COVID)  [id number]  POCT RAPID STREP A (OFFICE)    EKG   Radiology DG Chest 2 View  Result Date: 01/18/2022 CLINICAL DATA:  Cough, shortness of breath. EXAM: CHEST - 2 VIEW COMPARISON:  January 23, 2020. FINDINGS: The heart size and mediastinal contours are within normal limits. Both lungs are clear. The visualized skeletal structures are unremarkable. IMPRESSION: No active cardiopulmonary disease. Electronically Signed   By: Marijo Conception M.D.   On:  01/18/2022 11:59    Procedures Procedures (including critical care time)  Medications Ordered in UC Medications  albuterol (PROVENTIL) (2.5 MG/3ML) 0.083% nebulizer solution 2.5 mg (2.5 mg Nebulization Given 01/18/22 1126)    Initial Impression / Assessment and Plan / UC Course  I have reviewed the triage vital signs and the nursing notes.  Pertinent labs & imaging results that were available during my care of the patient were reviewed by me and considered in my medical decision making (see chart for details).     Patient presents with symptoms likely from a viral upper respiratory infection. Differential includes bacterial pneumonia, sinusitis, allergic rhinitis, covid 19, flu, RSV.  Patient is nontoxic appearing and not in need of emergent medical intervention. Suspect possible viral bronchitis.  Chest x-ray was negative for any acute cardiopulmonary process.  Albuterol nebulizer administered in urgent care with patient stating that she felt better.  No concern for cardiac etiology.  Viral testing pending.  Patient sent albuterol inhaler and is requesting nebulizer medications to use as needed as well.  She states that she has a nebulizer machine at home.  Advised patient that inhaler and nebulizer medication are the same medication and to use at least 6 hours apart if used in the same day.  Patient voiced  understanding.  I also think the patient would benefit from prednisone to decrease information in chest and help alleviate cough and shortness of breath.  Patient has taken prednisone before and tolerated well.  Patient does not take any daily medications per her report and there is no obvious contraindications to prednisone in patient's history.  Benzonatate prescribed take as needed for cough as well.  Return if symptoms fail to improve.  ER precautions given to patient. Patient states understanding and is agreeable.  Discharged with PCP followup.  Final Clinical Impressions(s) / UC  Diagnoses   Final diagnoses:  Viral upper respiratory tract infection with cough  Shortness of breath     Discharge Instructions      You have a viral illness most likely viral bronchitis that is causing your symptoms and shortness of breath.  Your chest x-ray was normal and did not show any signs of pneumonia.  I am treating you with albuterol, prednisone, cough medication.  Please be advised that inhaler medication and nebulizer medication are the same and to use at least 4 to 6 hours apart if in the same day.  COVID test is pending.  We will call if it is positive.  Please follow-up if any symptoms persist or worsen.     ED Prescriptions     Medication Sig Dispense Auth. Provider   predniSONE (STERAPRED UNI-PAK 21 TAB) 10 MG (21) TBPK tablet Take by mouth daily. Take 6 tabs by mouth daily  for 2 days, then 5 tabs for 2 days, then 4 tabs for 2 days, then 3 tabs for 2 days, 2 tabs for 2 days, then 1 tab by mouth daily for 2 days 42 tablet Galdino Hinchman, Hildred Alamin E, FNP   albuterol (VENTOLIN HFA) 108 (90 Base) MCG/ACT inhaler Inhale 1-2 puffs into the lungs every 6 (six) hours as needed for wheezing or shortness of breath. 1 each [place], Hildred Alamin E, FNP   albuterol (PROVENTIL) (2.5 MG/3ML) 0.083% nebulizer solution Take 3 mLs (2.5 mg total) by nebulization every 6 (six) hours as needed for wheezing or shortness of breath. 75 mL Haseeb Fiallos, Hildred Alamin E, [place]   benzonatate (TESSALON) 100 MG capsule Take 1 capsule (100 mg total) by mouth every 8 (eight) hours as needed for cough. 21 capsule [place], Michele Rockers, [place]      PDMP not reviewed this encounter.   Teodora Medici, [place] 01/18/22 1258

## 2022-01-18 NOTE — ED Triage Notes (Signed)
Pt is present today with c/o cough, SOB, lightheaded, and sore throat. Pt sx started last Thursday

## 2022-01-18 NOTE — Discharge Instructions (Signed)
You have a viral illness most likely viral bronchitis that is causing your symptoms and shortness of breath.  Your chest x-ray was normal and did not show any signs of pneumonia.  I am treating you with albuterol, prednisone, cough medication.  Please be advised that inhaler medication and nebulizer medication are the same and to use at least 4 to 6 hours apart if in the same day.  COVID test is pending.  We will call if it is positive.  Please follow-up if any symptoms persist or worsen.

## 2022-01-21 ENCOUNTER — Ambulatory Visit: Payer: BC Managed Care – PPO | Admitting: Dietician

## 2022-01-21 LAB — CULTURE, GROUP A STREP (THRC)

## 2022-01-25 DIAGNOSIS — F32A Depression, unspecified: Secondary | ICD-10-CM | POA: Diagnosis not present

## 2022-02-15 DIAGNOSIS — N898 Other specified noninflammatory disorders of vagina: Secondary | ICD-10-CM | POA: Diagnosis not present

## 2022-02-15 DIAGNOSIS — Z113 Encounter for screening for infections with a predominantly sexual mode of transmission: Secondary | ICD-10-CM | POA: Diagnosis not present

## 2022-02-15 DIAGNOSIS — N760 Acute vaginitis: Secondary | ICD-10-CM | POA: Diagnosis not present

## 2022-02-15 DIAGNOSIS — A609 Anogenital herpesviral infection, unspecified: Secondary | ICD-10-CM | POA: Diagnosis not present

## 2022-06-03 ENCOUNTER — Ambulatory Visit (INDEPENDENT_AMBULATORY_CARE_PROVIDER_SITE_OTHER): Payer: BC Managed Care – PPO | Admitting: Family Medicine

## 2022-06-03 ENCOUNTER — Encounter: Payer: Self-pay | Admitting: Family Medicine

## 2022-06-03 ENCOUNTER — Other Ambulatory Visit: Payer: Self-pay

## 2022-06-03 VITALS — BP 130/86 | HR 85 | Ht 69.5 in | Wt 356.9 lb

## 2022-06-03 DIAGNOSIS — M25551 Pain in right hip: Secondary | ICD-10-CM

## 2022-06-03 DIAGNOSIS — M79671 Pain in right foot: Secondary | ICD-10-CM

## 2022-06-03 NOTE — Patient Instructions (Signed)
Thank you for coming in today.   Please get an Xray today before you leave   I've referred you to Physical Therapy.  Let us know if you don't hear from them in one week.   Gel heel cups  Check back in 6 weeks

## 2022-06-03 NOTE — Progress Notes (Unsigned)
I, [name], CMA acting as a scribe for [name], MD.  Monique Bryan is a 32 y.o. female who presents to [place] at [facility] today for back and hip pain. Pt's last ESI was on 10/12/21. Pt was previously seen by Dr. Georgina Snell on 12/21/21 for a rhomboid strain.   Today, pt reports continued right hip pain. Pt locates pain to anterior to lateral aspect. Also c/o pain at posterior aspect of the ankle and into the right heel. Sx started to really started to flare up yesterday. Has been trying to move around more and do more activities in an effort to lose weight. Has been sleeping on the right side more often. Sx worse when first standing after prolonged sitting, improves with movement.   Radiating pain: no LE numbness/tingling: numbness/cool feeling at lateral right hip.  LE weakness: no Aggravates: prolonged sitting Treatments tried: movement, IBU  Dx imaging: 02/28/21 L-spine MRI 09/05/20 L-spine XR  Pertinent review of systems: No fevers or chills  Relevant historical information: Hypertension   Exam:  BP 130/86   Pulse 85   Ht 5' 9.5" (1.765 m)   Wt (!) 356 lb 14.4 oz (161.9 kg)   SpO2 97%   BMI 51.95 kg/m  General: Well Developed, well nourished, and in no acute distress.   MSK: Right hip: Normal-appearing Normal motion. Some pain with hip rotation and flexion felt in the anterior hip. Hip abduction and external rotation strength is intact without much pain. Hip is nontender. I am able to reproduce hip pain with flexion and internal rotation.  Right foot normal appearing Tender palpation mildly posterior and plantar calcaneus barely    Lab and Radiology Results  Diagnostic Limited MSK Ultrasound of: Right foot Achilles tendon intact without tear or retrocalcaneal bursitis. Mild calcific change at distal tendon insertion. Plantar fascia origin at plantar calcaneus normal-appearing Impression: Mild calcific Achilles  tendinitis.   X-ray images right hip ordered today but not obtained.   Assessment and Plan: 32 y.o. female with right hip pain.  This is a chronic issue with an acute exacerbation.  Previously she has been thought to have trochanteric bursitis.  Today the pain is more located anteriorly and thought to perhaps be intra-articular related such as arthritis.  I ordered an x-ray but I think she forgot to get it while she was leaving.  We talked about the possibility of a steroid injection.  She was reluctant to proceed with one.  Will try physical therapy and reassess in 6 weeks.   Right foot pain: Thought to be Achilles tendinitis and plantar fasciitis.  Improving already.  Recommend heel cups and watchful waiting.  Recheck 6 weeks.  PDMP not reviewed this encounter. Orders Placed This Encounter  Procedures   [place] LIMITED JOINT SPACE STRUCTURES LOW RIGHT(NO LINKED CHARGES)    Order Specific Question:   Reason for Exam (SYMPTOM  OR DIAGNOSIS REQUIRED)    Answer:   right hip pain, right heel pain    Order Specific Question:   Preferred imaging location?    Answer:   [place]   DG HIP UNILAT W OR W/O PELVIS 2-3 VIEWS RIGHT    Standing Status:   Future    Standing Expiration Date:   07/04/2022    Order Specific Question:   Reason for Exam (SYMPTOM  OR DIAGNOSIS REQUIRED)    Answer:   right hip pain    Order Specific Question:   Preferred imaging location?  Answer:   [facility]    Order Specific Question:   Is patient pregnant?    Answer:   No   Ambulatory referral to Physical Therapy    Referral Priority:   Routine    Referral Type:   Physical Medicine    Referral Reason:   Specialty Services Required    Requested Specialty:   Physical Therapy    Number of Visits Requested:   1   No orders of the defined types were placed in this encounter.    Discussed warning signs or symptoms. Please see discharge instructions. Patient expresses  understanding.   The above documentation has been reviewed and is accurate and complete [name], M.D. .

## 2022-06-24 ENCOUNTER — Ambulatory Visit: Payer: BC Managed Care – PPO | Attending: Family Medicine

## 2022-07-15 ENCOUNTER — Ambulatory Visit: Payer: BC Managed Care – PPO | Admitting: Family Medicine

## 2022-07-15 NOTE — Progress Notes (Deleted)
   Rubin Payor, PhD, LAT, ATC acting as a scribe for Clementeen Graham, MD.  Monique Bryan is a 32 y.o. female who presents to [facility] at [facility] today for cont'd R hip and low back pain. Pt was last seen by Dr. Denyse Amass on 06/03/22 and was referred to PT, but no-showed her 1st visit on 4/5. Today, pt reports ***  Dx imaging: 02/28/21 L-spine MRI 09/05/20 L-spine XR  Pertinent review of systems: ***  Relevant historical information: ***   Exam:  There were no vitals taken for this visit. General: Well Developed, well nourished, and in no acute distress.   MSK: ***    Lab and Radiology Results No results found for this or any previous visit (from the past 72 hour(s)). No results found.     Assessment and Plan: 32 y.o. female with ***   PDMP not reviewed this encounter. No orders of the defined types were placed in this encounter.  No orders of the defined types were placed in this encounter.    Discussed warning signs or symptoms. Please see discharge instructions. Patient expresses understanding.   ***

## 2022-07-28 DIAGNOSIS — Z30431 Encounter for routine checking of intrauterine contraceptive device: Secondary | ICD-10-CM | POA: Diagnosis not present

## 2022-07-28 DIAGNOSIS — N760 Acute vaginitis: Secondary | ICD-10-CM | POA: Diagnosis not present

## 2022-08-06 ENCOUNTER — Emergency Department (HOSPITAL_COMMUNITY): Payer: BC Managed Care – PPO

## 2022-08-06 ENCOUNTER — Emergency Department (HOSPITAL_COMMUNITY)
Admission: EM | Admit: 2022-08-06 | Discharge: 2022-08-06 | Disposition: A | Discharge status: Home / Self Care | Payer: BC Managed Care – PPO | Attending: Emergency Medicine | Admitting: Emergency Medicine

## 2022-08-06 ENCOUNTER — Other Ambulatory Visit: Payer: Self-pay

## 2022-08-06 ENCOUNTER — Encounter (HOSPITAL_COMMUNITY): Payer: Self-pay | Admitting: *Deleted

## 2022-08-06 DIAGNOSIS — R059 Cough, unspecified: Secondary | ICD-10-CM | POA: Diagnosis not present

## 2022-08-06 DIAGNOSIS — R509 Fever, unspecified: Secondary | ICD-10-CM | POA: Diagnosis not present

## 2022-08-06 DIAGNOSIS — R0789 Other chest pain: Secondary | ICD-10-CM | POA: Diagnosis not present

## 2022-08-06 DIAGNOSIS — R0981 Nasal congestion: Secondary | ICD-10-CM | POA: Insufficient documentation

## 2022-08-06 DIAGNOSIS — R0602 Shortness of breath: Secondary | ICD-10-CM | POA: Diagnosis not present

## 2022-08-06 DIAGNOSIS — R079 Chest pain, unspecified: Secondary | ICD-10-CM

## 2022-08-06 LAB — CBC WITH DIFFERENTIAL/PLATELET
Abs Immature Granulocytes: 0.04 K/uL (ref 0.00–0.07)
Basophils Absolute: 0.1 K/uL (ref 0.0–0.1)
Basophils Relative: 0 %
Eosinophils Absolute: 0.3 K/uL (ref 0.0–0.5)
Eosinophils Relative: 2 %
HCT: 42.5 % (ref 36.0–46.0)
Hemoglobin: 13.7 g/dL (ref 12.0–15.0)
Immature Granulocytes: 0 %
Lymphocytes Relative: 28 %
Lymphs Abs: 3.4 K/uL (ref 0.7–4.0)
MCH: 28.6 pg (ref 26.0–34.0)
MCHC: 32.2 g/dL (ref 30.0–36.0)
MCV: 88.7 fL (ref 80.0–100.0)
Monocytes Absolute: 0.7 K/uL (ref 0.1–1.0)
Monocytes Relative: 6 %
Neutro Abs: 7.7 K/uL (ref 1.7–7.7)
Neutrophils Relative %: 64 %
Platelets: 315 K/uL (ref 150–400)
RBC: 4.79 MIL/uL (ref 3.87–5.11)
RDW: 13.6 % (ref 11.5–15.5)
WBC: 12.2 K/uL — ABNORMAL HIGH (ref 4.0–10.5)
nRBC: 0 % (ref 0.0–0.2)

## 2022-08-06 LAB — COMPREHENSIVE METABOLIC PANEL WITH GFR
ALT: 29 U/L (ref 0–44)
AST: 16 U/L (ref 15–41)
Albumin: 3.5 g/dL (ref 3.5–5.0)
Alkaline Phosphatase: 89 U/L (ref 38–126)
Anion gap: 8 (ref 5–15)
BUN: 12 mg/dL (ref 6–20)
CO2: 26 mmol/L (ref 22–32)
Calcium: 8.9 mg/dL (ref 8.9–10.3)
Chloride: 103 mmol/L (ref 98–111)
Creatinine, Ser: 0.77 mg/dL (ref 0.44–1.00)
GFR, Estimated: >60 mL/min
Glucose, Bld: 91 mg/dL (ref 70–99)
Potassium: 3.6 mmol/L (ref 3.5–5.1)
Sodium: 137 mmol/L (ref 135–145)
Total Bilirubin: 0.7 mg/dL (ref 0.3–1.2)
Total Protein: 7.6 g/dL (ref 6.5–8.1)

## 2022-08-06 LAB — TROPONIN I (HIGH SENSITIVITY): Troponin I (High Sensitivity): 3 ng/L

## 2022-08-06 MED ORDER — FAMOTIDINE 20 MG PO TABS
20.0000 mg | ORAL_TABLET | Freq: Once | ORAL | Status: AC
  Administered 2022-08-06: 20 mg via ORAL
  Filled 2022-08-06: qty 1

## 2022-08-06 MED ORDER — LIDOCAINE VISCOUS HCL 2 % MT SOLN
15.0000 mL | Freq: Once | OROMUCOSAL | Status: AC
  Administered 2022-08-06: 15 mL via ORAL
  Filled 2022-08-06: qty 15

## 2022-08-06 MED ORDER — ALUM & MAG HYDROXIDE-SIMETH 200-200-20 MG/5ML PO SUSP
30.0000 mL | Freq: Once | ORAL | Status: AC
  Administered 2022-08-06: 30 mL via ORAL
  Filled 2022-08-06: qty 30

## 2022-08-06 MED ORDER — PANTOPRAZOLE SODIUM 20 MG PO TBEC: 20.0000 mg | DELAYED_RELEASE_TABLET | Freq: Two times a day (BID) | ORAL | 0 refills | Status: DC

## 2022-08-06 MED ORDER — MAALOX MAX 400-400-40 MG/5ML PO SUSP: 15.0000 mL | Freq: Four times a day (QID) | ORAL | 0 refills | Status: DC | PRN

## 2022-08-06 NOTE — ED Triage Notes (Addendum)
Pt states last night she felt as if she could not breath after taking Musinex. Pt tearful in triage. Pt states she thought she was feeling better then felt like something is in her chest as if she can not breath. Pt noted to have deep nonproductive caough that started today.

## 2022-08-06 NOTE — ED Provider Notes (Signed)
EMERGENCY DEPARTMENT AT [facility] Provider Note   CSN: [id number] Arrival date & time: 08/06/22  1650     History Chief Complaint  Patient presents with   Shortness of Breath    HPI Monique Bryan is a 32 y.o. female presenting for fever and cough and congestion. States that breathing problems started yesterday. She states that she had GI symptoms throughout the week this week. Endorses diffuse multisystem involvement with malaise and fatigue and numbness Symptoms worse with lying flat.  Patient's recorded medical, surgical, social, medication list and allergies were reviewed in the Snapshot window as part of the initial history.   Review of Systems   Review of Systems  Constitutional:  Negative for chills and fever.  HENT:  Negative for ear pain and sore throat.   Eyes:  Negative for pain and visual disturbance.  Respiratory:  Negative for cough and shortness of breath.   Cardiovascular:  Negative for chest pain and palpitations.  Gastrointestinal:  Positive for abdominal distention, diarrhea, nausea and vomiting. Negative for abdominal pain.  Genitourinary:  Negative for dysuria and hematuria.  Musculoskeletal:  Negative for arthralgias and back pain.  Skin:  Negative for color change and rash.  Neurological:  Negative for seizures and syncope.  All other systems reviewed and are negative.   Physical Exam Updated Vital Signs BP (!) 201/131 (BP Location: Left Arm)   Pulse 99   Temp 98.6 F (37 C) (Oral)   Resp 20   Ht 5\' 9"  (1.753 m)   Wt (!) 161 kg   SpO2 96%   BMI 52.42 kg/m  Physical Exam Vitals and nursing note reviewed.  Constitutional:      General: She is not in acute distress.    Appearance: She is well-developed.  HENT:     Head: Normocephalic and atraumatic.  Eyes:     Conjunctiva/sclera: Conjunctivae normal.  Cardiovascular:     Rate and Rhythm: Normal rate and regular rhythm.     Heart sounds: No murmur  heard. Pulmonary:     Effort: Pulmonary effort is normal. No respiratory distress.     Breath sounds: Normal breath sounds.  Abdominal:     General: There is no distension.     Palpations: Abdomen is soft.     Tenderness: There is no abdominal tenderness. There is no right CVA tenderness or left CVA tenderness.  Musculoskeletal:        General: No swelling or tenderness. Normal range of motion.     Cervical back: Neck supple.  Skin:    General: Skin is warm and dry.  Neurological:     General: No focal deficit present.     Mental Status: She is alert and oriented to person, place, and time. Mental status is at baseline.     Cranial Nerves: No cranial nerve deficit.      ED Course/ Medical Decision Making/ A&P    Procedures Procedures   Medications Ordered in ED Medications - No data to display Medical Decision Making: Monique Bryan is a 32 y.o. female who presented to the ED today with chest pain, detailed above.  Based on patient's comorbidities, patient has a heart score of 2.    Patient placed on continuous vitals and telemetry monitoring while in ED which was reviewed periodically.  Complete initial physical exam performed, notably the patient was hemodynamically stable no acute distress.   Reviewed and confirmed nursing documentation for past medical history, family history, social history.  Initial Assessment: With the patient's presentation of left-sided chest pain, most likely diagnosis is musculoskeletal chest pain versus GERD, although ACS remains on the differential. Other diagnoses were considered including (but not limited to) pulmonary embolism, community-acquired pneumonia, aortic dissection, pneumothorax, underlying bony abnormality, anemia. These are considered less likely due to history of present illness and physical exam findings.    In particular, concerning pulmonary embolism: Patient is PERC negative and the they deny malignancy, recent surgery,  history of DVT, or calf tenderness leading to a low risk Wells score. Aortic Dissection also reconsidered but seems less likely based on the location, quality, onset, and severity of symptoms in this case. Patient has a lack of serious comorbidities for this condition including a lack of HTN or Smoking. Patient also has a lack of underlying history of AD or TAA.  This is most consistent with an acute life/limb threatening illness complicated by underlying chronic conditions.   Initial Plan: Evaluate for ACS with single troponin and EKG evaluated as below  Evaluate for dissection, bony abnormality, or pneumonia with chest x-ray and screening laboratory evaluation including CBC, BMP  Further evaluation for pulmonary embolism not indicated at this time based on patient's PERC and Wells score.  Further evaluation for Thoracic Aortic Dissection not indicated at this time based on patient's clinical history and PE findings.   Initial Study Results: EKG was reviewed independently. Rate, rhythm, axis, intervals all examined and without medically relevant abnormality. ST segments without concerns for elevations.    Laboratory  Single troponin demonstrated no acute abnormality  CBC and BMP without obvious metabolic or inflammatory abnormalities requiring further evaluation   Radiology  DG Chest 2 View  Result Date: 08/06/2022 CLINICAL DATA:  Shortness of breath EXAM: CHEST - 2 VIEW COMPARISON:  01/18/2022 and prior studies FINDINGS: The cardiomediastinal silhouette is unremarkable. Mild peribronchial thickening is unchanged. There is no evidence of focal airspace disease, pulmonary edema, suspicious pulmonary nodule/mass, pleural effusion, or pneumothorax. No acute bony abnormalities are identified. IMPRESSION: No active cardiopulmonary disease. Electronically Signed   By: Harmon Pier M.D.   On: 08/06/2022 17:20    Final Assessment and Plan: Patient's history present on this visit and findings are  consistent with any acute pathology at this time.  Well-appearing on reassessment stable for outpatient care management.  Likely gastroesophageal reflux disease.  Referred to primary care provider for further diagnostic care and management.  Informed patient about her elevated blood pressure importance of close outpatient follow-up.  It had resolved spontaneously while in the emergency room and patient is at high risk for progression of disease.           Clinical Impression: No diagnosis found.   Data Unavailable   Final Clinical Impression(s) / ED Diagnoses Final diagnoses:  None    Rx / DC Orders ED Discharge Orders     None         Glyn Ade, MD 08/06/22 2216

## 2022-08-06 NOTE — ED Notes (Signed)
Pt has several complaints including cough, congestion, and difficulty breathing when lying down x several days and "stomach bug" x almost a week.

## 2022-08-08 ENCOUNTER — Encounter (HOSPITAL_COMMUNITY): Payer: Self-pay | Admitting: *Deleted

## 2022-10-25 DIAGNOSIS — N611 Abscess of the breast and nipple: Secondary | ICD-10-CM | POA: Diagnosis not present

## 2022-12-05 ENCOUNTER — Encounter: Payer: Self-pay | Admitting: Family Medicine

## 2022-12-05 ENCOUNTER — Ambulatory Visit (INDEPENDENT_AMBULATORY_CARE_PROVIDER_SITE_OTHER): Payer: BC Managed Care – PPO | Admitting: Family Medicine

## 2022-12-05 VITALS — BP 120/82 | HR 85 | Ht 69.0 in | Wt 367.0 lb

## 2022-12-05 DIAGNOSIS — M545 Low back pain, unspecified: Secondary | ICD-10-CM | POA: Diagnosis not present

## 2022-12-05 MED ORDER — TIZANIDINE HCL 4 MG PO TABS: 4.0000 mg | ORAL_TABLET | Freq: Three times a day (TID) | ORAL | 1 refills | Status: DC | PRN

## 2022-12-05 MED ORDER — PREDNISONE 50 MG PO TABS: 50.0000 mg | ORAL_TABLET | Freq: Every day | ORAL | 0 refills | Status: DC

## 2022-12-05 NOTE — Patient Instructions (Signed)
Thank you for coming in today.   Use a heating pad and TENS unit.   Ok to use tizanidine muscle relaxer mostly at bedtime as needed.   If you have that sciatica pain going down the leg take prednisone and let me know.   Next step is probably PT.  Let me know   I can do xrays in the future if you are not getting better.

## 2022-12-05 NOTE — Progress Notes (Unsigned)
   I, Stevenson Clinch, CMA acting as a scribe for Clementeen Graham, MD.  Monique Bryan is a 32 y.o. female who presents to [facility] at [facility] today for re-occurring back pain. Pt was last seen by Dr. Denyse Amass on 06/03/22 and was referred to PT, but no-showed her 1st visit on 4/5. Pt's last lumbar ESI was on 10/12/21. Today, pt reports flare up of back pain after picking daughter up from shopping cart twice. Has tried Advil and movement with minimal relief. Sx worse when standing after prolonged time sitting. Walking does seem to ease the tension in her back. Sx started this past Saturday and have been constant since then. Warm water and pressure feels good on the lower back. Sx primarily right-sided. Pain radiating into the hip and groin. Denies sharp shooting pain into the legs, n/t in the feet, weakness in B LE.  Flareup occurred a few days ago.  She denies any radiating pain down her legs weakness numbness bowel or bladder dysfunction.  Dx imaging: 02/28/21 L-spine MRI 09/05/20 L-spine XR   Pertinent review of systems: No fevers or chills  Relevant historical information: Hypertension and obesity.   Exam:  BP 120/82   Pulse 85   Ht 5\' 9"  (1.753 m)   Wt (!) 367 lb (166.5 kg)   SpO2 98%   BMI 54.20 kg/m  General: Well Developed, well nourished, and in no acute distress.   MSK: L-spine nontender palpation spinal midline normal lumbar motion.  Left shoulder strength is intact. Reflexes are intact distally.      Assessment and Plan: 32 y.o. female with acute exacerbation of chronic low back pain.  Patient was seen for lumbar radiculopathy a few years ago and had benefit from an epidural steroid injection.  Today's pain is different than previous pain.  She has more axial back pain located predominantly in the right paraspinal musculoskeletal group.  This is thought to be muscle spasm and dysfunction and should resolve without advanced treatment.  Plan for home  exercise program heating pad TENS unit and tizanidine.  I did prescribe prednisone to use as a backup if she develops radicular pain.  Consider adding physical therapy if needed.  We can get updated x-rays especially if not improving as well.  PDMP not reviewed this encounter. No orders of the defined types were placed in this encounter.  Meds ordered this encounter  Medications   tiZANidine (ZANAFLEX) 4 MG tablet    Sig: Take 1 tablet (4 mg total) by mouth every 8 (eight) hours as needed.    Dispense:  30 tablet    Refill:  1   predniSONE (DELTASONE) 50 MG tablet    Sig: Take 1 tablet (50 mg total) by mouth daily.    Dispense:  5 tablet    Refill:  0     Discussed warning signs or symptoms. Please see discharge instructions. Patient expresses understanding.   The above documentation has been reviewed and is accurate and complete Clementeen Graham, M.D.

## 2022-12-27 ENCOUNTER — Other Ambulatory Visit (HOSPITAL_BASED_OUTPATIENT_CLINIC_OR_DEPARTMENT_OTHER): Payer: Self-pay

## 2022-12-27 DIAGNOSIS — G4730 Sleep apnea, unspecified: Secondary | ICD-10-CM

## 2023-01-10 ENCOUNTER — Ambulatory Visit (HOSPITAL_BASED_OUTPATIENT_CLINIC_OR_DEPARTMENT_OTHER): Payer: BC Managed Care – PPO | Attending: Obstetrics and Gynecology | Admitting: Internal Medicine

## 2023-01-10 DIAGNOSIS — G4730 Sleep apnea, unspecified: Secondary | ICD-10-CM

## 2023-01-10 DIAGNOSIS — Z01419 Encounter for gynecological examination (general) (routine) without abnormal findings: Secondary | ICD-10-CM | POA: Diagnosis not present

## 2023-01-10 DIAGNOSIS — Z6841 Body Mass Index (BMI) 40.0 and over, adult: Secondary | ICD-10-CM | POA: Diagnosis not present

## 2023-01-24 ENCOUNTER — Ambulatory Visit (HOSPITAL_BASED_OUTPATIENT_CLINIC_OR_DEPARTMENT_OTHER): Payer: BC Managed Care – PPO | Attending: Internal Medicine | Admitting: Internal Medicine

## 2023-01-30 ENCOUNTER — Ambulatory Visit (HOSPITAL_BASED_OUTPATIENT_CLINIC_OR_DEPARTMENT_OTHER): Payer: BC Managed Care – PPO | Attending: Internal Medicine | Admitting: Internal Medicine

## 2023-01-30 VITALS — Ht 69.0 in | Wt 360.0 lb

## 2023-01-30 DIAGNOSIS — G4730 Sleep apnea, unspecified: Secondary | ICD-10-CM | POA: Insufficient documentation

## 2023-01-30 DIAGNOSIS — G4733 Obstructive sleep apnea (adult) (pediatric): Secondary | ICD-10-CM

## 2023-01-31 ENCOUNTER — Encounter (HOSPITAL_BASED_OUTPATIENT_CLINIC_OR_DEPARTMENT_OTHER): Payer: Self-pay | Admitting: Radiology

## 2023-02-04 DIAGNOSIS — G4733 Obstructive sleep apnea (adult) (pediatric): Secondary | ICD-10-CM

## 2023-02-04 NOTE — Procedures (Signed)
   Patient Name: Monique Bryan, Monique Bryan Date: 01/30/2023 Gender: Female D.O.B: [birth year] Age (years): 32 Referring Provider: Belva Agee MD Height (inches): 69 Interpreting Physician: Jetty Duhamel MD, ABSM Weight (lbs): 360 RPSGT: [place] Sink BMI: 53 MRN: [id number] Neck Size: <br>  CLINICAL INFORMATION Sleep Study Type: HST Indication for sleep study: OSA Epworth Sleepiness Score: 0  SLEEP STUDY TECHNIQUE A multi-channel overnight portable sleep study was performed. The channels recorded were: nasal airflow, thoracic respiratory movement, and oxygen saturation with a pulse oximetry. Snoring was also monitored.  MEDICATIONS Patient self administered medications include: none reported.  SLEEP ARCHITECTURE Patient was studied for 364.5 minutes. The sleep efficiency was 100.0 % and the patient was supine for 0%. The arousal index was 0.0 per hour.  RESPIRATORY PARAMETERS The overall AHI was 26.0 per hour, with a central apnea index of 0 per hour. The oxygen nadir was 78% during sleep.  CARDIAC DATA Mean heart rate during sleep was 78.8 bpm.  IMPRESSIONS - Moderate obstructive sleep apnea occurred during this study (AHI = 26.0/h). - Oxygen desaturation was noted during this study (Min O2 = 78%, Mean 93%). - No snoring was audible during this study.  DIAGNOSIS - Obstructive Sleep Apnea (G47.33)  RECOMMENDATIONS - Suggest CPAP titration sleep study or autopap. Other options would be based on clinical judgment. - Be careful with alcohol, sedatives and other CNS depressants that may worsen sleep apnea and disrupt normal sleep architecture. - Sleep hygiene should be reviewed to assess factors that may improve sleep quality. - Weight management and regular exercise should be initiated or continued.  [Electronically signed] 02/04/2023 12:20 PM  Jetty Duhamel MD, ABSM Diplomate, American Board of Sleep Medicine NPI: [id number]                           Jetty Duhamel Diplomate, American Board of Sleep Medicine  ELECTRONICALLY SIGNED ON:  02/04/2023, 12:17 PM [facility] PH: [phone]   FX: [phone] ACCREDITED BY THE AMERICAN ACADEMY OF SLEEP MEDICINE

## 2023-02-21 ENCOUNTER — Encounter (HOSPITAL_BASED_OUTPATIENT_CLINIC_OR_DEPARTMENT_OTHER): Payer: Self-pay

## 2023-02-21 DIAGNOSIS — G4733 Obstructive sleep apnea (adult) (pediatric): Secondary | ICD-10-CM

## 2023-08-11 DIAGNOSIS — Z6841 Body Mass Index (BMI) 40.0 and over, adult: Secondary | ICD-10-CM | POA: Diagnosis not present

## 2023-08-11 DIAGNOSIS — E282 Polycystic ovarian syndrome: Secondary | ICD-10-CM | POA: Diagnosis not present

## 2023-08-11 DIAGNOSIS — I10 Essential (primary) hypertension: Secondary | ICD-10-CM | POA: Diagnosis not present

## 2023-09-15 DIAGNOSIS — E1169 Type 2 diabetes mellitus with other specified complication: Secondary | ICD-10-CM | POA: Diagnosis not present

## 2023-09-15 DIAGNOSIS — Z6841 Body Mass Index (BMI) 40.0 and over, adult: Secondary | ICD-10-CM | POA: Diagnosis not present

## 2023-10-02 ENCOUNTER — Ambulatory Visit (INDEPENDENT_AMBULATORY_CARE_PROVIDER_SITE_OTHER): Admitting: Family Medicine

## 2023-10-02 ENCOUNTER — Encounter: Payer: Self-pay | Admitting: Family Medicine

## 2023-10-02 ENCOUNTER — Ambulatory Visit (INDEPENDENT_AMBULATORY_CARE_PROVIDER_SITE_OTHER)

## 2023-10-02 VITALS — BP 132/86 | HR 98 | Ht 69.0 in | Wt 379.0 lb

## 2023-10-02 DIAGNOSIS — G8929 Other chronic pain: Secondary | ICD-10-CM

## 2023-10-02 DIAGNOSIS — M5416 Radiculopathy, lumbar region: Secondary | ICD-10-CM | POA: Diagnosis not present

## 2023-10-02 DIAGNOSIS — M545 Low back pain, unspecified: Secondary | ICD-10-CM | POA: Diagnosis not present

## 2023-10-02 DIAGNOSIS — M5442 Lumbago with sciatica, left side: Secondary | ICD-10-CM | POA: Diagnosis not present

## 2023-10-02 DIAGNOSIS — M48061 Spinal stenosis, lumbar region without neurogenic claudication: Secondary | ICD-10-CM | POA: Diagnosis not present

## 2023-10-02 DIAGNOSIS — M47816 Spondylosis without myelopathy or radiculopathy, lumbar region: Secondary | ICD-10-CM | POA: Diagnosis not present

## 2023-10-02 MED ORDER — GABAPENTIN 300 MG PO CAPS: 300.0000 mg | ORAL_CAPSULE | Freq: Three times a day (TID) | ORAL | 2 refills | Status: DC | PRN

## 2023-10-02 NOTE — Progress Notes (Signed)
   I, Leotis Batter, CMA acting as a scribe for Artist Lloyd, MD.  Monique Bryan is a 33 y.o. female who presents to [facility] at [facility] today for L leg pain. Pt was previously seen by Dr. Lloyd on 12/05/22 for LBP.  Today, pt c/o exacerbation of lower back sx causing radiating pain into the left gluteal region, hip, and leg. Sx worse with sitting and when laying supine. Denies weakness in L LE. Radicular sx have been present x 2 months, worsening since onset. Sx tend to improve with movement. Has been working on stretching and HEP, some discomfort. Denies n/t in the L LE. Notes occasional cool sensation in the lower back with prolonged sitting. IBU prn.   Pertinent review of systems: No fevers or chills  Relevant historical information: Previous episode of lumbar radiculopathy seen on MRI 11/07/2020 treated with epidural steroid injections.   Exam:  BP 132/86   Pulse 98   Ht 5' 9 (1.753 m)   Wt (!) 379 lb (171.9 kg)   SpO2 95%   BMI 55.97 kg/m  General: Well Developed, well nourished, and in no acute distress.   MSK: L-spine: Normal appearing Nontender to palpation midline.  Decreased lumbar motion. Lower extremity strength is intact. Reflexes are intact.    Lab and Radiology Results  X-ray images lumbar spine obtained today personally and independently interpreted. DDD L5-S1.  No acute fractures are visible. Await formal radiology review     Assessment and Plan: 33 y.o. female with recurrent left lumbar radiculopathy.  Symptoms are most consistent with L5.  Symptoms are similar today to how they have been in the past.  Will obtain x-ray and repeat an epidural steroid injection x 1.  Additionally will prescribe gabapentin .  If the epidural steroid injection is not effective next step should be MRI lumbar spine.   PDMP not reviewed this encounter. Orders Placed This Encounter  Procedures   DG Lumbar Spine 2-3 Views    Standing Status:    Future    Number of Occurrences:   1    Expiration Date:   11/02/2023    Reason for Exam (SYMPTOM  OR DIAGNOSIS REQUIRED):   low back pain    Preferred imaging location?:   [place]    Is patient pregnant?:   No   DG INJECT DIAG/THERA/INC NEEDLE/CATH/PLC EPI/LUMB/SAC W/IMG    Level and technique per radiology    Standing Status:   Future    Expiration Date:   11/02/2023    Reason for Exam (SYMPTOM  OR DIAGNOSIS REQUIRED):   Low back pain    Preferred Imaging Location?:   [facility]    Is the patient pregnant?:   No   Meds ordered this encounter  Medications   gabapentin  (NEURONTIN ) 300 MG capsule    Sig: Take 1 capsule (300 mg total) by mouth 3 (three) times daily as needed.    Dispense:  90 capsule    Refill:  2     Discussed warning signs or symptoms. Please see discharge instructions. Patient expresses understanding.   The above documentation has been reviewed and is accurate and complete Artist Lloyd, M.D.

## 2023-10-02 NOTE — Patient Instructions (Signed)
 Thank you for coming in today.   Please get an Xray today before you leave   We've place an order for your back injection, please reach out to [facility] at [phone] if you do not hear about scheduling over the next week or so.   We've sent in a refill of Gabapentin  to take up to three times daily as needed, but primarily at bedtime.   See you back as needed.

## 2023-10-04 ENCOUNTER — Ambulatory Visit: Payer: Self-pay | Admitting: Family Medicine

## 2023-10-04 NOTE — Progress Notes (Signed)
Lumbar spine x-ray shows a little bit of arthritis.

## 2023-10-17 NOTE — Discharge Instructions (Signed)

## 2023-10-18 ENCOUNTER — Ambulatory Visit
Admission: RE | Admit: 2023-10-18 | Discharge: 2023-10-18 | Disposition: A | Discharge status: Home / Self Care | Source: Ambulatory Visit | Attending: Family Medicine | Admitting: Family Medicine

## 2023-10-18 ENCOUNTER — Other Ambulatory Visit: Payer: Self-pay | Admitting: Family Medicine

## 2023-10-18 DIAGNOSIS — G8929 Other chronic pain: Secondary | ICD-10-CM

## 2023-10-18 DIAGNOSIS — M5416 Radiculopathy, lumbar region: Secondary | ICD-10-CM

## 2023-10-18 DIAGNOSIS — M4727 Other spondylosis with radiculopathy, lumbosacral region: Secondary | ICD-10-CM | POA: Diagnosis not present

## 2023-10-18 MED ORDER — IOPAMIDOL (ISOVUE-M 200) INJECTION 41%
1.0000 mL | Freq: Once | INTRAMUSCULAR | Status: AC
  Administered 2023-10-18: 1 mL via EPIDURAL

## 2023-10-18 MED ORDER — METHYLPREDNISOLONE ACETATE 40 MG/ML INJ SUSP (RADIOLOG
80.0000 mg | Freq: Once | INTRAMUSCULAR | Status: AC
  Administered 2023-10-18: 80 mg via EPIDURAL

## 2023-10-24 NOTE — Procedures (Signed)
 Per appt notes: patient unable to complete.

## 2023-10-31 ENCOUNTER — Encounter (INDEPENDENT_AMBULATORY_CARE_PROVIDER_SITE_OTHER): Payer: Self-pay

## 2023-11-21 ENCOUNTER — Encounter: Payer: Self-pay | Admitting: Family Medicine

## 2023-11-21 ENCOUNTER — Ambulatory Visit (INDEPENDENT_AMBULATORY_CARE_PROVIDER_SITE_OTHER): Admitting: Family Medicine

## 2023-11-21 VITALS — BP 122/84 | HR 86 | Ht 69.0 in | Wt 378.0 lb

## 2023-11-21 DIAGNOSIS — M5416 Radiculopathy, lumbar region: Secondary | ICD-10-CM

## 2023-11-21 DIAGNOSIS — G8929 Other chronic pain: Secondary | ICD-10-CM | POA: Diagnosis not present

## 2023-11-21 DIAGNOSIS — M5442 Lumbago with sciatica, left side: Secondary | ICD-10-CM

## 2023-11-21 MED ORDER — TIZANIDINE HCL 4 MG PO TABS: 4.0000 mg | ORAL_TABLET | Freq: Three times a day (TID) | ORAL | 1 refills | Status: DC | PRN

## 2023-11-21 MED ORDER — METHYLPREDNISOLONE ACETATE 80 MG/ML IJ SUSP
80.0000 mg | Freq: Once | INTRAMUSCULAR | Status: AC
Start: 2023-11-21 — End: 2023-11-21
  Administered 2023-11-21: 80 mg via INTRAMUSCULAR

## 2023-11-21 MED ORDER — KETOROLAC TROMETHAMINE 60 MG/2ML IM SOLN
60.0000 mg | Freq: Once | INTRAMUSCULAR | Status: AC
Start: 2023-11-21 — End: 2023-11-21
  Administered 2023-11-21: 60 mg via INTRAMUSCULAR

## 2023-11-21 NOTE — Patient Instructions (Signed)
 Thank you for coming in today.   Toradol  and Depo-Medrol  injections today.   MRI L-spine (low back ordered)  Tizanidine  refilled  Work note provided, return 1-3 days  See you back as needed.

## 2023-11-21 NOTE — Progress Notes (Signed)
 I, Leotis Batter, CMA acting as a scribe for Artist Lloyd, MD.  Monique Bryan is a 33 y.o. female who presents to [facility] at [facility] today for cont'd LBP. Pt was last seen by Dr. Lloyd on 10/02/23 and was prescribed gabapentin  and ESI was ordered.  Today, pt reports exacerbation of left-sided lower back pain over the past week, worsening since onset. Has tried IBU with short-term relief. Notes worsening sx after bending at the waist without bending the knees, making it difficult to walk. At that point, started Gabapentin . Notes minimal improvement with this. Also c/o cramping sensation in the lower abd. Having regular BM. Feels that she has to lean back to ambulate. Also c/o radiating pain into the posterior thigh going into the lower leg. Denies weakness in the LE.   Dx imaging: 10/02/23 L-spine XR  Pertinent review of systems: No fevers or chills  Relevant historical information: Hypertension   Exam:  BP 122/84   Pulse 86   Ht 5' 9 (1.753 m)   Wt (!) 378 lb (171.5 kg)   SpO2 99%   BMI 55.82 kg/m  General: Well Developed, well nourished, and in no acute distress.   MSK: L-spine decreased lumbar motion.  Lower extremity strength is intact    Lab and Radiology Results  EXAM: LUMBAR SPINE - 3 VIEW   COMPARISON:  09/05/2020   FINDINGS: Five lumbar type vertebral bodies are well visualized. Vertebral body height is well maintained. Mild osteophytic changes are seen. Mild disc space narrowing is noted at L4-5 and L5-S1. No soft tissue abnormality is seen.   IMPRESSION: Mild degenerative change without acute abnormality.     Electronically Signed   By: Oneil Devonshire M.D.   On: 10/03/2023 22:12   I, Artist Lloyd, personally (independently) visualized and performed the interpretation of the images attached in this note.      Assessment and Plan: 33 y.o. female with left low back pain with pain radiating down the left leg in an S1  dermatomal pattern.  She had an MRI in December 2022 that did show potential for left sciatica.  Her current symptoms are similar to that.  She had an epidural steroid injection recently which did not help.  Updated x-ray did not look much different.  Plan for updated MRI.  Will prescribe tizanidine .  Will give IM Toradol  and Depo-Medrol  injection prior to discharge.  Continue gabapentin  as needed. Anticipate either updated epidural steroid injection or referral to neurosurgery or both after we get her MRI results back.  PDMP not reviewed this encounter. Orders Placed This Encounter  Procedures   MR LUMBAR SPINE WO CONTRAST    Standing Status:   Future    Expiration Date:   12/21/2023    What is the patient's sedation requirement?:   No Sedation    Does the patient have a pacemaker or implanted devices?:   No    Preferred imaging location?:   [facility] (table limit-350lbs)   Meds ordered this encounter  Medications   ketorolac  (TORADOL ) injection 60 mg   methylPREDNISolone  acetate (DEPO-MEDROL ) injection 80 mg   tiZANidine  (ZANAFLEX ) 4 MG tablet    Sig: Take 1 tablet (4 mg total) by mouth every 8 (eight) hours as needed.    Dispense:  30 tablet    Refill:  1     Discussed warning signs or symptoms. Please see discharge instructions. Patient expresses understanding.   The above documentation has been reviewed and is  accurate and complete Artist Lloyd, M.D.

## 2023-11-22 ENCOUNTER — Telehealth: Payer: Self-pay | Admitting: Family Medicine

## 2023-11-22 NOTE — Telephone Encounter (Signed)
 Patient called and stated that she received two shots yesterday and she does not feel any better. She stated that the shot made the pain mild but she can still pretty much feel it. She does not think it is making a difference to where she can go back to work. She is still in pain. She did take two tylenol  yesterday evening and more of gabapentin  and that did not help. Please advise.

## 2023-11-22 NOTE — Telephone Encounter (Addendum)
 Spoke with patient, took muscle relaxer today at 3:30, has not noticed much relief overall. Has been taking Gabapentin  1-2 x daily. Denies drowsiness related to taking Gabapentin .   She will plan to take IBU and muscle relaxer before bed tonight start taking Gabapentin  TID tomorrow.   Forwarding to Dr. Joane to see if agreeable. Advised that I would call her back around lunch tomorrow.   Work note provided.

## 2023-11-22 NOTE — Telephone Encounter (Signed)
 Dr. Joane, I will reach out to pt regarding work note. Please advise regarding recommendation for pain management / continued pain.

## 2023-11-23 MED ORDER — HYDROCODONE-ACETAMINOPHEN 5-325 MG PO TABS: 1.0000 | ORAL_TABLET | Freq: Four times a day (QID) | ORAL | 0 refills | Status: DC | PRN

## 2023-11-23 NOTE — Telephone Encounter (Signed)
 Called and spoke with patient, she is considering going to the ED d/t severity of sx. Not much relief with meds. Has MRI scheduled for Sunday.   Agreeable to trying pain med, send to [facility] (pharmacy updated). She will call back with a status update tomorrow or this afternoon is she has not heard from the pharmacy about picking up the medication.  Forwarding to Dr. Joane to prescribe.

## 2023-11-23 NOTE — Telephone Encounter (Signed)
 That makes sense.  Happy to prescribe a stronger opiate-based pain medicine if needed as well.

## 2023-11-23 NOTE — Telephone Encounter (Signed)
Hydrocodone prescribed 

## 2023-11-23 NOTE — Addendum Note (Signed)
 Addended by: JOANE ARTIST RAMAN on: 11/23/2023 12:36 PM   Modules accepted: Orders

## 2023-11-24 NOTE — Telephone Encounter (Signed)
 Called and spoke with patient, she reports 50% improvement. She will let us  know if sx regress.

## 2023-11-26 ENCOUNTER — Ambulatory Visit (INDEPENDENT_AMBULATORY_CARE_PROVIDER_SITE_OTHER)

## 2023-11-26 DIAGNOSIS — G8929 Other chronic pain: Secondary | ICD-10-CM | POA: Diagnosis not present

## 2023-11-26 DIAGNOSIS — M5416 Radiculopathy, lumbar region: Secondary | ICD-10-CM

## 2023-11-26 DIAGNOSIS — M5442 Lumbago with sciatica, left side: Secondary | ICD-10-CM | POA: Diagnosis not present

## 2023-11-26 DIAGNOSIS — M4726 Other spondylosis with radiculopathy, lumbar region: Secondary | ICD-10-CM | POA: Diagnosis not present

## 2023-11-26 DIAGNOSIS — M5117 Intervertebral disc disorders with radiculopathy, lumbosacral region: Secondary | ICD-10-CM | POA: Diagnosis not present

## 2023-11-26 DIAGNOSIS — M545 Low back pain, unspecified: Secondary | ICD-10-CM | POA: Diagnosis not present

## 2023-11-28 ENCOUNTER — Ambulatory Visit: Payer: Self-pay | Admitting: Family Medicine

## 2023-11-28 DIAGNOSIS — M5416 Radiculopathy, lumbar region: Secondary | ICD-10-CM

## 2023-11-28 NOTE — Progress Notes (Signed)
 Lumbar spine MRI does not look significantly different than prior MRI showing stable pinched nerve.  You did not improve with an epidural steroid injection.  I think at this point it makes sense to have a consultation with a surgeon.  I am referring you to the neurosurgery group in [place] which is the same group that I have seen personally.  They will to talk to you about what surgery could look like and if another shot is worthwhile.

## 2023-12-07 ENCOUNTER — Telehealth: Payer: Self-pay | Admitting: Family Medicine

## 2023-12-07 MED ORDER — HYDROCODONE-ACETAMINOPHEN 5-325 MG PO TABS: 1.0000 | ORAL_TABLET | Freq: Four times a day (QID) | ORAL | 0 refills | Status: DC | PRN

## 2023-12-07 NOTE — Telephone Encounter (Signed)
 Patient called and states that she has just finished tylenol  and muscle relaxer and she is still taking gabapentin . She is having excruciating pain in her right leg all the way down to her calf. She has set up appt with PT and the surgeon but not until October. She wants to know if in the meantime as she is still in pain and gabapentin  is not helping when she sleeps can she get a steroid shot or does he have any recommendations for what she should do in the meantime. Please advise.

## 2023-12-07 NOTE — Telephone Encounter (Signed)
 I have refilled the hydrocodone .  You can increase the gabapentin  up to 900 mg up to 3 times daily.  This would be 3 pills 3 times daily.  I recommend trying to increase the nighttime dose first if you need to.  I will also communicate with the provider you are seen at neurosurgery office on the seventh to see if we can get you in sooner.

## 2023-12-07 NOTE — Telephone Encounter (Signed)
 Called and spoke with patient. She wants to check with Dr. Joane about getting a 2nd injection because a few years back when she had similar pain, she had a couple of injections before she got relief but the relief lasted 2 years. She would like to continue with physical therapy. She has some reservations about seeing the neurosurgeon, considering trying weight loss and PT first.   She would like to get Dr. Virgilio opinion on what he thinks that best option would be between meds, PT, weight loss, and consult with the surgeon.   Forwarding to Dr. Joane to review and advise.

## 2023-12-07 NOTE — Telephone Encounter (Signed)
 Called pt and left VM to call neurology office back (or respond via MyChart) about sooner appointment.

## 2023-12-07 NOTE — Telephone Encounter (Signed)
 Please call her and see if she can see me at 1:00 on Monday 9/22. Okay to add her there.   If she can't come in that day then let me know.   Dr. Joane- we will call her about above. Thanks for heads up.

## 2023-12-08 NOTE — Telephone Encounter (Signed)
 Spoke to pt and provided Dr. Virgilio advise. She would like a f/u visit to discuss. Appointment scheduled for 9/26.

## 2023-12-08 NOTE — Telephone Encounter (Signed)
 That so hard to know.  Would you like me to order another injection?  It may be wise for us  to have an office visit together to discuss these questions further.

## 2023-12-14 NOTE — Progress Notes (Unsigned)
   LILLETTE Ileana Collet, PhD, LAT, ATC acting as a scribe for Artist Lloyd, MD.  Monique Bryan is a 33 y.o. female who presents to [facility] at [facility] today for f/u LBP w/ MRI review. Pt was last seen by Dr. Lloyd on 11/21/23 and was prescribed tizanidine , given Toradol /Depo-Medrol  IM injection, and new MRI was ordered.   Today, pt reports ***  Dx imaging: 11/26/23 L-spine MRI 10/02/23 L-spine XR  Pertinent review of systems: ***  Relevant historical information: ***   Exam:  There were no vitals taken for this visit. General: Well Developed, well nourished, and in no acute distress.   MSK: ***    Lab and Radiology Results No results found for this or any previous visit (from the past 72 hours). No results found.     Assessment and Plan: 33 y.o. female with ***   PDMP not reviewed this encounter. No orders of the defined types were placed in this encounter.  No orders of the defined types were placed in this encounter.    Discussed warning signs or symptoms. Please see discharge instructions. Patient expresses understanding.   ***

## 2023-12-15 ENCOUNTER — Ambulatory Visit: Admitting: Family Medicine

## 2023-12-15 ENCOUNTER — Encounter: Payer: Self-pay | Admitting: Family Medicine

## 2023-12-15 VITALS — BP 118/84 | HR 84 | Ht 69.0 in | Wt 371.0 lb

## 2023-12-15 DIAGNOSIS — M5416 Radiculopathy, lumbar region: Secondary | ICD-10-CM

## 2023-12-15 MED ORDER — HYDROCODONE-ACETAMINOPHEN 5-325 MG PO TABS: 1.0000 | ORAL_TABLET | Freq: Four times a day (QID) | ORAL | 0 refills | Status: DC | PRN

## 2023-12-15 MED ORDER — TIZANIDINE HCL 4 MG PO TABS: 4.0000 mg | ORAL_TABLET | Freq: Three times a day (TID) | ORAL | 1 refills | Status: DC | PRN

## 2023-12-15 MED ORDER — GABAPENTIN 300 MG PO CAPS: 300.0000 mg | ORAL_CAPSULE | Freq: Three times a day (TID) | ORAL | 2 refills | Status: DC | PRN

## 2023-12-15 NOTE — Patient Instructions (Addendum)
 Thank you for coming in today.   Meds refilled today.   Order placed for back injection.   Referral placed to Neuro Surgery in [place].  See you back as needed.

## 2023-12-22 NOTE — Progress Notes (Signed)
 Referring Physician:  Joane Artist RAMAN, MD [address] [place],  [place] [address]  Primary Physician:  Latisha Medford, MD  History of Present Illness: [name] has a history of HTN, PCOS, depression, obesity.   Had similar symptoms 3 years ago that improved with ESIs.   Now with 2 months of more constant LBP with constant left posterior leg pain to her foot. No right leg pain. She has numbness and tingling in her left leg. Pain is worse with laying and sitting down. Left leg pain > LBP. Some relief with medications and walking. Pain is worse at night and wakes her up.   She did not have any relief with last ESI in July.   Dr. Joane has her on hydrocodone  and gabapentin .   Tobacco use: Does not smoke.   Bowel/Bladder Dysfunction: none  Conservative measures:  Physical therapy:  none Multimodal medical therapy including regular antiinflammatories: tizanidine , gabapentin , prednisone , tramadol , hydrocodone -acetaminophen  Injections:   10/18/2023 Left S1 10/12/2021 Left S1 06/01/2021 Left S1   Past Surgery: no spine surgery  Monique Bryan has no symptoms of cervical myelopathy.  The symptoms are causing a significant impact on the patient's life.   Review of Systems:  A 10 point review of systems is negative, except for the pertinent positives and negatives detailed in the HPI.  Past Medical History: Past Medical History:  Diagnosis Date   Back pain    Edema of both lower extremities    Hypertension    Medical history non-contributory    Obesity    PCOS (polycystic ovarian syndrome)    Sleep apnea    SOB (shortness of breath) on exertion     Past Surgical History: Past Surgical History:  Procedure Laterality Date   ADENOIDECTOMY     DILATATION & CURETTAGE/HYSTEROSCOPY WITH MYOSURE N/A 01/21/2019   Procedure: DILATATION & CURETTAGE/HYSTEROSCOPY WITH MYOSURE;  Surgeon: Marne Kelly Nest, MD;  Location: [facility] OR;  Service: Gynecology;   Laterality: N/A;   INTRAUTERINE DEVICE (IUD) INSERTION N/A 01/21/2019   Procedure: INTRAUTERINE DEVICE (IUD) INSERTION;  Surgeon: Marne Kelly Nest, MD;  Location: [facility] OR;  Service: Gynecology;  Laterality: N/A;  Mirena     TONSILLECTOMY      Allergies: Allergies as of 12/26/2023   (No Known Allergies)    Medications: Outpatient Encounter Medications as of 12/26/2023  Medication Sig   gabapentin  (NEURONTIN ) 300 MG capsule Take 1-3 capsules (300-900 mg total) by mouth 3 (three) times daily as needed.   HYDROcodone -acetaminophen  (NORCO/VICODIN) 5-325 MG tablet Take 1 tablet by mouth every 6 (six) hours as needed.   ibuprofen  (ADVIL ) 800 MG tablet Take 800 mg by mouth every 8 (eight) hours as needed. for pain   levonorgestrel  (MIRENA , 52 MG,) 20 MCG/DAY IUD Take by intrauterine route.   MOUNJARO 2.5 MG/0.5ML Pen SMARTSIG:2.5 Milligram(s) SUB-Q Once a Week   valACYclovir (VALTREX) 500 MG tablet Take 500 mg by mouth 2 (two) times daily.   [DISCONTINUED] albuterol  (PROVENTIL ) (2.5 MG/3ML) 0.083% nebulizer solution Take 3 mLs (2.5 mg total) by nebulization every 6 (six) hours as needed for wheezing or shortness of breath.   [DISCONTINUED] albuterol  (VENTOLIN  HFA) 108 (90 Base) MCG/ACT inhaler Inhale 1-2 puffs into the lungs every 6 (six) hours as needed for wheezing or shortness of breath.   [DISCONTINUED] alum & mag hydroxide-simeth (MAALOX MAX) 400-400-40 MG/5ML suspension Take 15 mLs by mouth every 6 (six) hours as needed for indigestion.   [DISCONTINUED] benzonatate  (TESSALON ) 100 MG capsule Take 1  capsule (100 mg total) by mouth every 8 (eight) hours as needed for cough.   [DISCONTINUED] Cholecalciferol  (VITAMIN D3) 50 MCG (2000 UT) CAPS Take 1 capsule (2,000 Units total) by mouth daily.   [DISCONTINUED] Insulin  Pen Needle (BD PEN NEEDLE NANO 2ND GEN) 32G X 4 MM MISC Use 1 needle daily to inject medication.   [DISCONTINUED] lidocaine  (XYLOCAINE ) 2 % solution Use as directed 15 mLs in the  mouth or throat every 4 (four) hours as needed for mouth pain.   [DISCONTINUED] pantoprazole  (PROTONIX ) 20 MG tablet Take 1 tablet (20 mg total) by mouth 2 (two) times daily.   [DISCONTINUED] predniSONE  (DELTASONE ) 50 MG tablet Take 1 tablet (50 mg total) by mouth daily.   [DISCONTINUED] promethazine -dextromethorphan  (PROMETHAZINE -DM) 6.25-15 MG/5ML syrup Take 5 mLs by mouth 4 (four) times daily as needed for cough.   [DISCONTINUED] tiZANidine  (ZANAFLEX ) 4 MG tablet Take 1 tablet (4 mg total) by mouth every 8 (eight) hours as needed.   [DISCONTINUED] traMADol  (ULTRAM ) 50 MG tablet Take 1 tablet (50 mg total) by mouth every 8 (eight) hours as needed for severe pain.   [DISCONTINUED] vitamin B-12 (CYANOCOBALAMIN ) 1000 MCG tablet Take 1 tablet (1,000 mcg total) by mouth daily. Annual appt due in June must see provider for future refills   [DISCONTINUED] Vitamin D , Ergocalciferol , (DRISDOL ) 1.25 MG (50000 UNIT) CAPS capsule Take 1 capsule (50,000 Units total) by mouth every 7 (seven) days.   No facility-administered encounter medications on file as of 12/26/2023.    Social History: Social History   Tobacco Use   Smoking status: Some Days    Types: Cigarettes   Smokeless tobacco: Never  Vaping Use   Vaping status: Never Used  Substance Use Topics   Alcohol use: Never   Drug use: Never    Types: Marijuana    Family Medical History: Family History  Problem Relation Age of Onset   Sickle cell trait Mother     Physical Examination: Vitals:   12/26/23 1310  BP: 130/80    General: Patient is well developed, well nourished, calm, collected, and in no apparent distress. Attention to examination is appropriate.  Respiratory: Patient is breathing without any difficulty.   NEUROLOGICAL:     Awake, alert, oriented to person, place, and time.  Speech is clear and fluent. Fund of knowledge is appropriate.   Cranial Nerves: Pupils equal round and reactive to light.  Facial tone is  symmetric.    No posterior lumbar tenderness.   No abnormal lesions on exposed skin.   Strength: Side Biceps Triceps Deltoid Interossei Grip Wrist Ext. Wrist Flex.  R 5 5 5 5 5 5 5   L 5 5 5 5 5 5 5    Side Iliopsoas Quads Hamstring PF DF EHL  R 5 5 5 5 5 5   L 5 5 5 5 5 5    Reflexes are 2+ and symmetric at the biceps, brachioradialis, patella and achilles.   Hoffman's is absent.  Clonus is not present.   Bilateral upper and lower extremity sensation is intact to light touch.     No pain with ER/IR of both hips.   She can heel/toe stand on both right and left foot.   Gait is normal.      Medical Decision Making  Imaging: Lumbar MRI dated 11/26/23:  FINDINGS: There is stable alignment of the lumbar spine. There is chronic disc disease at L4-5 and L5-S1. There is no vertebral body height loss, subluxation or marrow replacing process. The  sacrum and SI joints are unremarkable so far as visualized. Conus and cauda equina are unremarkable.   T12-L1: There is no focal disc protrusion, foraminal or spinal stenosis.   L1-2: There is no focal disc protrusion, foraminal or spinal stenosis.   L2-3: There is no focal disc protrusion, foraminal or spinal stenosis.   L3-4: There is no focal disc protrusion, foraminal or spinal stenosis.   L4-5: Mild disc desiccation is present with moderate facet arthrosis. Unchanged. No significant foraminal or spinal stenosis is present.   L5-S1: There is a relatively stable large central disc extrusion best seen on sagittal image 8 and axial image 36 and 37. Chronic mass effect on the descending S1 nerve roots, left greater than right. Correlation for left S1 radiculopathy.   The retroperitoneal structures demonstrate no significant abnormality.   IMPRESSION: Disc desiccation facet arthrosis at L4-5 is stable. No significant foraminal or spinal.   Stable L5-S1 disc extrusion with mass effect on the descending S1 nerve roots in the lateral  recess, left greater than right. No significant interval change. Correlation for chronic S1 radicular symptoms.   Electronically signed by: Norleen Satchel MD 11/27/2023 05:10 PM EDT RP Workstation: [id number]    I have personally reviewed the images and agree with the above interpretation.  Assessment and Plan: Ms. Babich had similar symptoms 3 years ago that improved with ESIs.   Now with 2 months of more constant LBP with constant left posterior leg pain to her foot. No right leg pain. She has numbness and tingling in her left leg. Left leg pain > LBP.    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   Treatment options discussed with patient and following plan made:   - Agree with getting lumbar ESI next week. Hopefully she will see improvement.  - Recommend PT for lumbar spine. She declines for now. Wants to revisit after ESI.  - Discussed that prior to any surgery discussion that she would need to do at least 6 weeks of PT and lose weight. Would want her BMI closer to 35-40. This would be weight goal of 240-270 lbs.  - Discussed increased risk of surgical complications such as wound healing, infection, medical complications (like pneumonia), and also at risk of pain not improving after surgery with a higher BMI.  - Will message her in 2 weeks after her ESI to check on her progress and regroup.  - Red flag symptoms such as new weakness and bowel/bladder issues reviewed.   I spent a total of 45 minutes in face-to-face and non-face-to-face activities related to this patient's care today including review of outside records, review of imaging, review of symptoms, physical exam, discussion of differential diagnosis, discussion of treatment options, and documentation.   Thank you for involving me in the care of this patient.   Glade Boys PA-C Dept. of Neurosurgery

## 2023-12-26 ENCOUNTER — Encounter: Payer: Self-pay | Admitting: Orthopedic Surgery

## 2023-12-26 ENCOUNTER — Ambulatory Visit: Admitting: Orthopedic Surgery

## 2023-12-26 VITALS — BP 130/80 | Ht 69.0 in | Wt 373.2 lb

## 2023-12-26 DIAGNOSIS — M5126 Other intervertebral disc displacement, lumbar region: Secondary | ICD-10-CM | POA: Diagnosis not present

## 2023-12-26 DIAGNOSIS — M5416 Radiculopathy, lumbar region: Secondary | ICD-10-CM

## 2023-12-26 DIAGNOSIS — M4726 Other spondylosis with radiculopathy, lumbar region: Secondary | ICD-10-CM

## 2023-12-26 DIAGNOSIS — M47816 Spondylosis without myelopathy or radiculopathy, lumbar region: Secondary | ICD-10-CM

## 2023-12-26 NOTE — Patient Instructions (Signed)
 It was so nice to see you today. Thank you so much for coming in.    You have a herniated disc at L5-S1 and this is likely causing your left leg and lower back pain.   I agrees with getting another lumbar injection.   I recommend PT for your lower back. We can revisit after the injection.   You would need to do at least 6 weeks of PT for insurance to approve a surgery. We also would want you to lose weight prior to a surgery. Being at a higher weight puts you at an increased risk of surgical complications such as wound healing, infection, medical complications (like pneumonia), and also at risk of pain not improving after surgery. Would like to you get down to 240-270 lbs if possible.   I would discuss your medications with Dr. Joane especially regarding your pain medication.   I will message you in 2 weeks to check on your progress after the injection and we can regroup.   Please do not hesitate to call if you have any questions or concerns. You can also message me in MyChart.   Glade Boys PA-C [phone]     The physicians and staff at [facility] at [facility] are committed to providing excellent care. You may receive a survey asking for feedback about your experience at our office. We value you your feedback and appreciate you taking the time to to fill it out. The [facility] leadership team is also available to discuss your experience in person, feel free to contact us  [phone].

## 2024-01-01 ENCOUNTER — Ambulatory Visit
Admission: RE | Admit: 2024-01-01 | Discharge: 2024-01-01 | Disposition: A | Discharge status: Home / Self Care | Source: Ambulatory Visit | Attending: Family Medicine | Admitting: Family Medicine

## 2024-01-01 DIAGNOSIS — M5416 Radiculopathy, lumbar region: Secondary | ICD-10-CM

## 2024-01-01 DIAGNOSIS — M4726 Other spondylosis with radiculopathy, lumbar region: Secondary | ICD-10-CM | POA: Diagnosis not present

## 2024-01-01 MED ORDER — IOPAMIDOL (ISOVUE-M 200) INJECTION 41%
1.0000 mL | Freq: Once | INTRAMUSCULAR | Status: AC
  Administered 2024-01-01: 1 mL via EPIDURAL

## 2024-01-01 MED ORDER — METHYLPREDNISOLONE ACETATE 40 MG/ML INJ SUSP (RADIOLOG
80.0000 mg | Freq: Once | INTRAMUSCULAR | Status: AC
  Administered 2024-01-01: 80 mg via EPIDURAL

## 2024-01-01 NOTE — Discharge Instructions (Signed)

## 2024-01-09 DIAGNOSIS — Z6841 Body Mass Index (BMI) 40.0 and over, adult: Secondary | ICD-10-CM | POA: Diagnosis not present

## 2024-01-09 DIAGNOSIS — Z713 Dietary counseling and surveillance: Secondary | ICD-10-CM | POA: Diagnosis not present

## 2024-01-18 ENCOUNTER — Other Ambulatory Visit: Payer: Self-pay | Admitting: Family Medicine

## 2024-01-18 NOTE — Telephone Encounter (Signed)
 Rx refill request approved per Dr. Zollie Pee orders.

## 2024-02-23 DIAGNOSIS — Z713 Dietary counseling and surveillance: Secondary | ICD-10-CM | POA: Diagnosis not present

## 2024-02-23 DIAGNOSIS — Z6841 Body Mass Index (BMI) 40.0 and over, adult: Secondary | ICD-10-CM | POA: Diagnosis not present

## 2024-02-28 ENCOUNTER — Telehealth: Payer: Self-pay | Admitting: Family Medicine

## 2024-02-28 NOTE — Telephone Encounter (Signed)
 Pt called, having a little trouble with what to do next. She saw the surgeon, in order to do surgery she has to complete PT and lose a significant amount of weight.  Is on Mounjaro and has lost 30 lbs in 2 months but not sure if this is sustainable for her. Feels unable to do PT due to the pain. Also concerned with the amount of ibuprofen  she is taking. The muscle relaxers do not help. Pain seems worse in the cold weather and affecting her daily life.  Open to another appt to discuss,also needs refill of Gabapentin  please.

## 2024-02-29 MED ORDER — GABAPENTIN 300 MG PO CAPS: 300.0000 mg | ORAL_CAPSULE | Freq: Three times a day (TID) | ORAL | 2 refills | Status: AC | PRN

## 2024-02-29 MED ORDER — GABAPENTIN 300 MG PO CAPS: 300.0000 mg | ORAL_CAPSULE | Freq: Three times a day (TID) | ORAL | 2 refills | Status: DC | PRN

## 2024-02-29 NOTE — Addendum Note (Signed)
 Addended by: MARDY LEOTIS RAMAN on: 02/29/2024 09:43 AM   Modules accepted: Orders

## 2024-02-29 NOTE — Telephone Encounter (Signed)
 Patient called regarding this phone call yesterday. Please call patient back. She is completely out of medicine and in pain. She was told she may have to come in so please advise if patient needs an appt. No gabapentin  last night so up all night but did take ibuprofen . Please advise.

## 2024-02-29 NOTE — Telephone Encounter (Signed)
 Patient called stating that the gabapentin  should have been sent to [facility].  She said that she feels like the muscle relaxer does not seem to be helping so she has been taking a lot of ibuprofen , which she does not want to do. She asked if Dr Joane would be able to refill her pain medication? At this point she does not know what to do to help the pain? She mentioned a shot, referral to a specialist, etc?  Please advise.

## 2024-02-29 NOTE — Telephone Encounter (Signed)
 RX sent to [facility].   Forwarding to Dr. Joane to review and advise regarding pt questions.

## 2024-02-29 NOTE — Telephone Encounter (Signed)
 Gabapentin  refilled. Forwarding to Dr. Joane to review and advise regarding pt questions.

## 2024-02-29 NOTE — Addendum Note (Signed)
 Addended by: MARDY LEOTIS RAMAN on: 02/29/2024 12:48 PM   Modules accepted: Orders

## 2024-03-01 MED ORDER — HYDROCODONE-ACETAMINOPHEN 5-325 MG PO TABS: 1.0000 | ORAL_TABLET | Freq: Four times a day (QID) | ORAL | 0 refills | Status: DC | PRN

## 2024-03-01 NOTE — Addendum Note (Signed)
 Addended by: JOANE ARTIST RAMAN on: 03/01/2024 12:11 PM   Modules accepted: Orders

## 2024-03-01 NOTE — Telephone Encounter (Signed)
 Hydrocodone  prescribed.   Looking at the neurosurgery notes it looks like they wanted to get you into physical therapy and do another epidural steroid injection.  Did the shot back in October helped?  Looks like next up should be physical therapy.  What are your thoughts on this.

## 2024-03-04 NOTE — Telephone Encounter (Signed)
 Forwarding to Dr. Denyse Amass to review and advise.

## 2024-03-07 NOTE — Addendum Note (Signed)
 Addended by: MARDY LEOTIS RAMAN on: 03/07/2024 07:39 AM   Modules accepted: Orders

## 2024-03-16 ENCOUNTER — Emergency Department (HOSPITAL_COMMUNITY)
Admission: EM | Admit: 2024-03-16 | Discharge: 2024-03-16 | Disposition: A | Discharge status: Home / Self Care | Attending: Emergency Medicine | Admitting: Emergency Medicine

## 2024-03-16 DIAGNOSIS — I10 Essential (primary) hypertension: Secondary | ICD-10-CM | POA: Insufficient documentation

## 2024-03-16 DIAGNOSIS — N12 Tubulo-interstitial nephritis, not specified as acute or chronic: Secondary | ICD-10-CM | POA: Insufficient documentation

## 2024-03-16 DIAGNOSIS — J101 Influenza due to other identified influenza virus with other respiratory manifestations: Secondary | ICD-10-CM | POA: Diagnosis not present

## 2024-03-16 DIAGNOSIS — Z79899 Other long term (current) drug therapy: Secondary | ICD-10-CM | POA: Diagnosis not present

## 2024-03-16 DIAGNOSIS — M5442 Lumbago with sciatica, left side: Secondary | ICD-10-CM | POA: Diagnosis not present

## 2024-03-16 DIAGNOSIS — R112 Nausea with vomiting, unspecified: Secondary | ICD-10-CM | POA: Diagnosis present

## 2024-03-16 LAB — URINALYSIS, ROUTINE W REFLEX MICROSCOPIC
Bilirubin Urine: NEGATIVE
Glucose, UA: NEGATIVE mg/dL
Hgb urine dipstick: NEGATIVE
Ketones, ur: 20 mg/dL — AB
Nitrite: NEGATIVE
Protein, ur: 30 mg/dL — AB
Specific Gravity, Urine: 1.023 (ref 1.005–1.030)
pH: 5 (ref 5.0–8.0)

## 2024-03-16 LAB — HEPATIC FUNCTION PANEL
ALT: 24 U/L (ref 0–44)
AST: 21 U/L (ref 15–41)
Albumin: 4.2 g/dL (ref 3.5–5.0)
Alkaline Phosphatase: 78 U/L (ref 38–126)
Bilirubin, Direct: 0.2 mg/dL (ref 0.0–0.2)
Indirect Bilirubin: 0.3 mg/dL (ref 0.3–0.9)
Total Bilirubin: 0.5 mg/dL (ref 0.0–1.2)
Total Protein: 8.1 g/dL (ref 6.5–8.1)

## 2024-03-16 LAB — CBC WITH DIFFERENTIAL/PLATELET
Abs Immature Granulocytes: 0.02 K/uL (ref 0.00–0.07)
Basophils Absolute: 0 K/uL (ref 0.0–0.1)
Basophils Relative: 0 %
Eosinophils Absolute: 0.1 K/uL (ref 0.0–0.5)
Eosinophils Relative: 2 %
HCT: 46.2 % — ABNORMAL HIGH (ref 36.0–46.0)
Hemoglobin: 15.6 g/dL — ABNORMAL HIGH (ref 12.0–15.0)
Immature Granulocytes: 0 %
Lymphocytes Relative: 40 %
Lymphs Abs: 3.5 K/uL (ref 0.7–4.0)
MCH: 30 pg (ref 26.0–34.0)
MCHC: 33.8 g/dL (ref 30.0–36.0)
MCV: 88.8 fL (ref 80.0–100.0)
Monocytes Absolute: 0.8 K/uL (ref 0.1–1.0)
Monocytes Relative: 9 %
Neutro Abs: 4.3 K/uL (ref 1.7–7.7)
Neutrophils Relative %: 49 %
Platelets: 329 K/uL (ref 150–400)
RBC: 5.2 MIL/uL — ABNORMAL HIGH (ref 3.87–5.11)
RDW: 13.5 % (ref 11.5–15.5)
WBC: 8.8 K/uL (ref 4.0–10.5)
nRBC: 0 % (ref 0.0–0.2)

## 2024-03-16 LAB — BASIC METABOLIC PANEL WITH GFR
Anion gap: 15 (ref 5–15)
BUN: 8 mg/dL (ref 6–20)
CO2: 24 mmol/L (ref 22–32)
Calcium: 9.7 mg/dL (ref 8.9–10.3)
Chloride: 98 mmol/L (ref 98–111)
Creatinine, Ser: 0.75 mg/dL (ref 0.44–1.00)
GFR, Estimated: >60 mL/min
Glucose, Bld: 114 mg/dL — ABNORMAL HIGH (ref 70–99)
Potassium: 3.6 mmol/L (ref 3.5–5.1)
Sodium: 136 mmol/L (ref 135–145)

## 2024-03-16 LAB — LIPASE, BLOOD: Lipase: 20 U/L (ref 11–51)

## 2024-03-16 LAB — RESP PANEL BY RT-PCR (RSV, FLU A&B, COVID)  RVPGX2
Influenza A by PCR: POSITIVE — AB
Influenza B by PCR: NEGATIVE
Resp Syncytial Virus by PCR: NEGATIVE
SARS Coronavirus 2 by RT PCR: NEGATIVE

## 2024-03-16 LAB — HCG, SERUM, QUALITATIVE: Preg, Serum: NEGATIVE

## 2024-03-16 MED ORDER — CEFDINIR 300 MG PO CAPS: 300.0000 mg | ORAL_CAPSULE | Freq: Two times a day (BID) | ORAL | 0 refills | Status: AC

## 2024-03-16 MED ORDER — BENZONATATE 100 MG PO CAPS: 100.0000 mg | ORAL_CAPSULE | Freq: Three times a day (TID) | ORAL | 0 refills | Status: AC

## 2024-03-16 MED ORDER — HYDROCODONE-ACETAMINOPHEN 5-325 MG PO TABS: 1.0000 | ORAL_TABLET | Freq: Four times a day (QID) | ORAL | 0 refills | Status: DC | PRN

## 2024-03-16 MED ORDER — ONDANSETRON HCL 4 MG/2ML IJ SOLN
4.0000 mg | Freq: Once | INTRAMUSCULAR | Status: AC
  Administered 2024-03-16: 4 mg via INTRAVENOUS
  Filled 2024-03-16: qty 2

## 2024-03-16 MED ORDER — SODIUM CHLORIDE 0.9 % IV BOLUS
1000.0000 mL | Freq: Once | INTRAVENOUS | Status: AC
  Administered 2024-03-16: 1000 mL via INTRAVENOUS

## 2024-03-16 MED ORDER — DEXAMETHASONE SOD PHOSPHATE PF 10 MG/ML IJ SOLN
10.0000 mg | Freq: Once | INTRAMUSCULAR | Status: AC
  Administered 2024-03-16: 10 mg via INTRAVENOUS

## 2024-03-16 MED ORDER — MORPHINE SULFATE (PF) 4 MG/ML IV SOLN
4.0000 mg | Freq: Once | INTRAVENOUS | Status: AC
  Administered 2024-03-16: 4 mg via INTRAVENOUS
  Filled 2024-03-16: qty 1

## 2024-03-16 MED ORDER — ONDANSETRON 4 MG PO TBDP: 4.0000 mg | ORAL_TABLET | Freq: Three times a day (TID) | ORAL | 0 refills | Status: AC | PRN

## 2024-03-16 MED ORDER — LIDOCAINE 5 % EX PTCH
1.0000 | MEDICATED_PATCH | CUTANEOUS | Status: DC
  Administered 2024-03-16: 1 via TRANSDERMAL
  Filled 2024-03-16: qty 1

## 2024-03-16 MED ORDER — METHOCARBAMOL 500 MG PO TABS: 500.0000 mg | ORAL_TABLET | Freq: Two times a day (BID) | ORAL | 0 refills | Status: DC

## 2024-03-16 MED ORDER — METHOCARBAMOL 500 MG PO TABS
500.0000 mg | ORAL_TABLET | Freq: Once | ORAL | Status: AC
  Administered 2024-03-16: 500 mg via ORAL
  Filled 2024-03-16: qty 1

## 2024-03-16 NOTE — ED Triage Notes (Signed)
 Patient c/o flu like symptoms since Wednesday, reports pain in back and leg is bad today Has h/o sciatica Pain rated 10/10

## 2024-03-16 NOTE — ED Provider Notes (Signed)
 " [place] EMERGENCY DEPARTMENT AT [facility] Provider Note   CSN: [id number] Arrival date & time: 03/16/24  1009     Patient presents with: No chief complaint on file.   Monique Bryan is a 33 y.o. female with past medical history significant for depression, PCOS, hypertension, and obesity who presents to the ED due to flu-like symptoms x 3 days.  Daughter recently tested positive for the flu 3 days ago.  Admits to nausea, vomiting, and diarrhea.  Also endorses severe low back pain that radiates down left lower extremity.  Has known lumbar radiculopathy.  Gets injections.  Notes her pain has worsened over the past few days. No recent injury. Admits to numbness/tingling down left lower extremity.  Denies saddle anesthesia, bowel/bladder incontinence, lower extremity weakness.  Has had a fever over the past few days which she relates to possibly having the flu.  No history of IV drug use.  No history of cancer.  Had an MRI September 2025 which demonstrates stable L5-S1 disc extrusion with mass effect on descending S1 nerve root left greater than right.  History obtained from patient and past medical records. No interpreter used during encounter.       Prior to Admission medications  Medication Sig Start Date End Date Taking? Authorizing Provider  benzonatate  (TESSALON ) 100 MG capsule Take 1 capsule (100 mg total) by mouth every 8 (eight) hours. 03/16/24  Yes Marielis Samara C, PA-C  cefdinir  (OMNICEF ) 300 MG capsule Take 1 capsule (300 mg total) by mouth 2 (two) times daily for 10 days. 03/16/24 03/26/24 Yes Roy Tokarz C, PA-C  HYDROcodone -acetaminophen  (NORCO/VICODIN) 5-325 MG tablet Take 1 tablet by mouth every 6 (six) hours as needed. 03/16/24  Yes Onell Mcmath, Aleck BROCKS, PA-C  methocarbamol  (ROBAXIN ) 500 MG tablet Take 1 tablet (500 mg total) by mouth 2 (two) times daily. 03/16/24  Yes Britain Saber, Aleck BROCKS, PA-C  ondansetron  (ZOFRAN -ODT) 4 MG disintegrating tablet  Take 1 tablet (4 mg total) by mouth every 8 (eight) hours as needed. 03/16/24  Yes Lorelle Aleck BROCKS, PA-C  gabapentin  (NEURONTIN ) 300 MG capsule Take 1-3 capsules (300-900 mg total) by mouth 3 (three) times daily as needed. 02/29/24   Corey, Evan S, MD  HYDROcodone -acetaminophen  (NORCO/VICODIN) 5-325 MG tablet Take 1 tablet by mouth every 6 (six) hours as needed. 03/01/24   Corey, Evan S, MD  ibuprofen  (ADVIL ) 800 MG tablet Take 800 mg by mouth every 8 (eight) hours as needed. for pain    [provider]  levonorgestrel  (MIRENA , 52 MG,) 20 MCG/DAY IUD Take by intrauterine route.    [provider]  MOUNJARO 2.5 MG/0.5ML Pen SMARTSIG:2.5 Milligram(s) SUB-Q Once a Week    [provider]  tiZANidine  (ZANAFLEX ) 4 MG tablet TAKE 1 TABLET(4 MG) BY MOUTH EVERY 8 HOURS AS NEEDED 01/18/24   Corey, Evan S, MD  valACYclovir (VALTREX) 500 MG tablet Take 500 mg by mouth 2 (two) times daily.    [provider]    Allergies: Patient has no known allergies.    Review of Systems  Respiratory:  Negative for shortness of breath.   Cardiovascular:  Negative for chest pain.  Gastrointestinal:  Positive for diarrhea, nausea and vomiting. Negative for abdominal pain.  Musculoskeletal:  Positive for back pain.  Neurological:  Positive for numbness. Negative for weakness.    Updated Vital Signs BP (!) 153/130 (BP Location: Right Arm) Comment: pt rocking back and forth in pain, visibly teary.  Pulse (!) 101  Temp 98.3 F (36.8 C) (Oral)   Resp (!) 24   Ht 5' 9.5 (1.765 m)   Wt (!) 158.3 kg   SpO2 100%   BMI 50.80 kg/m   Physical Exam Vitals and nursing note reviewed.  Constitutional:      General: She is not in acute distress.    Appearance: She is not ill-appearing.     Comments: Appear uncomfortable in chair  HENT:     Head: Normocephalic.  Eyes:     Pupils: Pupils are equal, round, and reactive to light.  Cardiovascular:     Rate and Rhythm: Normal rate  and regular rhythm.     Pulses: Normal pulses.     Heart sounds: Normal heart sounds. No murmur heard.    No friction rub. No gallop.  Pulmonary:     Effort: Pulmonary effort is normal.     Breath sounds: Normal breath sounds.  Abdominal:     General: Abdomen is flat. There is no distension.     Palpations: Abdomen is soft.     Tenderness: There is no abdominal tenderness. There is no guarding or rebound.  Musculoskeletal:        General: Normal range of motion.     Cervical back: Neck supple.     Comments: No thoracic or lumbar midline tenderness.  Patient able to ambulate in the room without difficulty. 5/5 strength to bilateral lower extremities.   Skin:    General: Skin is warm and dry.  Neurological:     General: No focal deficit present.     Mental Status: She is alert.  Psychiatric:        Mood and Affect: Mood normal.        Behavior: Behavior normal.     (all labs ordered are listed, but only abnormal results are displayed) Labs Reviewed  RESP PANEL BY RT-PCR (RSV, FLU A&B, COVID)  RVPGX2 - Abnormal; Notable for the following components:      Result Value   Influenza A by PCR POSITIVE (*)    All other components within normal limits  CBC WITH DIFFERENTIAL/PLATELET - Abnormal; Notable for the following components:   RBC 5.20 (*)    Hemoglobin 15.6 (*)    HCT 46.2 (*)    All other components within normal limits  BASIC METABOLIC PANEL WITH GFR - Abnormal; Notable for the following components:   Glucose, Bld 114 (*)    All other components within normal limits  URINALYSIS, ROUTINE W REFLEX MICROSCOPIC - Abnormal; Notable for the following components:   Color, Urine AMBER (*)    APPearance CLOUDY (*)    Ketones, ur 20 (*)    Protein, ur 30 (*)    Leukocytes,Ua TRACE (*)    Bacteria, UA MANY (*)    All other components within normal limits  URINE CULTURE  HEPATIC FUNCTION PANEL  LIPASE, BLOOD  HCG, SERUM, QUALITATIVE    EKG: None  Radiology: No results  found.   Procedures   Medications Ordered in the ED  lidocaine  (LIDODERM ) 5 % 1 patch (1 patch Transdermal Patch Applied 03/16/24 1200)  dexamethasone  (DECADRON ) injection 10 mg (10 mg Intravenous Given 03/16/24 1125)  sodium chloride  0.9 % bolus 1,000 mL (0 mLs Intravenous Stopped 03/16/24 1314)  morphine  (PF) 4 MG/ML injection 4 mg (4 mg Intravenous Given 03/16/24 1125)  methocarbamol  (ROBAXIN ) tablet 500 mg (500 mg Oral Given 03/16/24 1159)  ondansetron  (ZOFRAN ) injection 4 mg (4 mg Intravenous Given 03/16/24 1322)  Clinical Course as of 03/16/24 1440  Sat Mar 16, 2024  1153 WBC: 8.8 [CA]  1218 Influenza A By PCR(!): POSITIVE [CA]  1225 reassessed patient.  Patient admits to improvement in pain after morphine  and Decadron .  Patient able to ambulate.  Low suspicion for cauda equina or central cord compression. [CA]  1424 Bacteria, UA(!): MANY [CA]  1424 WBC, UA: 11-20 [CA]    Clinical Course User Index [CA] Lorelle Aleck BROCKS, PA-C                                 Medical Decision Making Amount and/or Complexity of Data Reviewed Labs: ordered. Decision-making details documented in ED Course.  Risk Prescription drug management.   This patient presents to the ED for concern of flu-like symptoms + back pain, this involves an extensive number of treatment options, and is a complaint that carries with it a high risk of complications and morbidity.  The differential diagnosis includes viral process, cauda equina, lumbar radiculopathy, infection, etc  33 year old female presents to the ED due to flulike symptoms and severe low back pain that radiates down left lower extremity.  Has known lumbar radiculopathy and gets injections frequently.  Daughter tested positive for the flu 3 days ago.  Upon arrival patient afebrile, tachycardic at 101 and slightly hypnic at 24.  Normal O2 saturation.  On exam patient appears uncomfortable in chair.  No thoracic or lumbar midline tenderness.   Does have some tenderness throughout left buttocks.  Able to ambulate without difficulty.  5/5 strength of bilateral lower extremities.  Low suspicion for cauda equina or central cord compression.  Routine labs ordered.  RVP to rule out infection.  Decadron , morphine , and IV fluids given.  CBC with no leukocytosis.  Elevated hemoglobin of 15.6.  Hemoconcentration?  BMP reassuring.  Normal renal function.  No major electrolyte derangements.  Hepatic function panel and lipase normal.  Low suspicion for pancreatitis. Abdomen soft, nondistended, nontender.  Low suspicion for acute abdomen.  RVP positive for influenza A which is likely what is causing patient's URI/GI symptoms.  Patient believes her cough has exacerbated her for chronic low back pain.  Upon reassessment, patient admits to improvement in pain.  Able to ambulate.  Low suspicion for cauda equina or central cord compression.  While patient does have some infectious symptoms feel her infectious symptoms are likely related to influenza A rather than discitis/osteomyelitis.  Do not feel emergent MRI needs to be performed today.  Discussed with patient if she develops any weakness or any change in symptoms return to the ED for possible MRI.  Advised patient to call her back doctor on Monday to schedule an appointment for further evaluation.  Patient is also due for a steroid injection. UA shows possible infection. Given she is having back pain will cover for pyelonephritis. Urine culture pending. Patient stable for discharge. Strict ED precautions discussed with patient. Patient states understanding and agrees to plan. Patient discharged home in no acute distress and stable vitals  Co morbidities that complicate the patient evaluation  Depression, HTN  Social Determinants of Health:  Has PCP  Test / Admission - Considered:  Considered admission however, patient much improved after IV pain medication.  Patient able to ambulate without difficulty.   Low suspicion for cauda equina or central cord compression.  Discussed with Dr. Franklyn who agrees with assessment and plan.     Final diagnoses:  Influenza  A  Acute left-sided low back pain with left-sided sciatica  Pyelonephritis    ED Discharge Orders          Ordered    cefdinir  (OMNICEF ) 300 MG capsule  2 times daily        03/16/24 1429    benzonatate  (TESSALON ) 100 MG capsule  Every 8 hours        03/16/24 1439    ondansetron  (ZOFRAN -ODT) 4 MG disintegrating tablet  Every 8 hours PRN        03/16/24 1439    HYDROcodone -acetaminophen  (NORCO/VICODIN) 5-325 MG tablet  Every 6 hours PRN        03/16/24 1439    methocarbamol  (ROBAXIN ) 500 MG tablet  2 times daily        03/16/24 1439               Ravon Mortellaro C, PA-C 03/16/24 1441  "

## 2024-03-16 NOTE — Discharge Instructions (Signed)
 It was a pleasure taking care of you today.  As discussed, your flu test was positive which is likely causing your symptoms.  Your urine also showed a possible infection.  I am sending you home antibiotics.  Take as prescribed and finish all antibiotics. I am also sending you home with pain medication and muscle relaxer.  Pain medication and muscle relaxers can cause drowsiness so do not drive or operate machinery while on the medication.  Please follow-up with your orthopedic surgeon for further evaluation of your back pain.  Return to the ER if you develop any weakness in your legs, peeing or pooping on yourself, or persistent fever after a week.  Return to the ER for new or worsening symptoms.

## 2024-03-17 LAB — URINE CULTURE

## 2024-03-19 ENCOUNTER — Telehealth: Payer: Self-pay | Admitting: Family Medicine

## 2024-03-19 NOTE — Telephone Encounter (Signed)
 Patient called wanting to be scheduled today for an emergency visit and is in excruciating pain. She called [facility] she states as well. She was diagnosed with the flu last week and has a UTI. She does not know if the flu has inflamed her sciatic nerve or what has. She states that the hospital prescribed her pain medicine and she is about done with that and is still in pain and not really able to move around. She would like to know if she should go back to the hospital or what she should do. Please advise.

## 2024-03-19 NOTE — Telephone Encounter (Signed)
 Please let pt know that Dr. Joane is out of the office until Friday. If her pain is unbearable, I'd recommend going to Urgent Care for evaluation.   Forwarding to Dr. Joane as RICK when he returns to the office on Friday.

## 2024-03-19 NOTE — Telephone Encounter (Signed)
 Forwarding to Dr. Joane to review and advise when he returns to the office on Friday.

## 2024-03-22 ENCOUNTER — Ambulatory Visit
Admission: RE | Admit: 2024-03-22 | Discharge: 2024-03-22 | Disposition: A | Discharge status: Home / Self Care | Source: Ambulatory Visit | Attending: Family Medicine | Admitting: Family Medicine

## 2024-03-22 DIAGNOSIS — M5416 Radiculopathy, lumbar region: Secondary | ICD-10-CM

## 2024-03-22 MED ORDER — METHOCARBAMOL 500 MG PO TABS: 500.0000 mg | ORAL_TABLET | Freq: Two times a day (BID) | ORAL | 0 refills | Status: AC

## 2024-03-22 MED ORDER — IOPAMIDOL (ISOVUE-M 200) INJECTION 41%
1.0000 mL | Freq: Once | INTRAMUSCULAR | Status: AC
  Administered 2024-03-22: 1 mL via EPIDURAL

## 2024-03-22 MED ORDER — HYDROCODONE-ACETAMINOPHEN 5-325 MG PO TABS: 1.0000 | ORAL_TABLET | Freq: Four times a day (QID) | ORAL | 0 refills | Status: AC | PRN

## 2024-03-22 MED ORDER — METHYLPREDNISOLONE ACETATE 40 MG/ML INJ SUSP (RADIOLOG
80.0000 mg | Freq: Once | INTRAMUSCULAR | Status: AC
  Administered 2024-03-22: 80 mg via EPIDURAL

## 2024-03-22 NOTE — Telephone Encounter (Signed)
 I refilled hydrocodone  and methocarbamol .  I do not think anybody is going to be able to do the injection sooner than [facility].

## 2024-03-22 NOTE — Addendum Note (Signed)
 Addended by: JOANE ARTIST RAMAN on: 03/22/2024 12:13 PM   Modules accepted: Orders

## 2024-03-22 NOTE — Discharge Instructions (Signed)

## 2024-03-25 ENCOUNTER — Other Ambulatory Visit

## 2024-04-01 ENCOUNTER — Ambulatory Visit: Admitting: Family Medicine

## 2024-04-01 NOTE — Progress Notes (Unsigned)
"       ° °  Monique Ileana Collet, PhD, LAT, ATC acting as a scribe for Monique Lloyd, MD.  Monique Bryan is a 34 y.o. female who presents to [facility] at [facility] today for cont'd lumbar radiculitis?***. Pt was last seen by Dr. Lloyd on 12/15/23 and a lumbar ESI was ordered and she was referred to neurosurgery. Refilled hydrocodone , tizanidine , and re-prescribed gabapentin .  Today, pt reports ***  Dx imaging: 11/26/23 L-spine MRI 10/02/23 L-spine XR  Pertinent review of systems: ***  Relevant historical information: ***   Exam:  There were no vitals taken for this visit. General: Well Developed, well nourished, and in no acute distress.   MSK: ***    Lab and Radiology Results No results found for this or any previous visit (from the past 72 hours). No results found.     Assessment and Plan: 34 y.o. female with ***   PDMP not reviewed this encounter. No orders of the defined types were placed in this encounter.  No orders of the defined types were placed in this encounter.    Discussed warning signs or symptoms. Please see discharge instructions. Patient expresses understanding.   ***  "

## 2024-04-10 ENCOUNTER — Encounter: Payer: Self-pay | Admitting: Family Medicine

## 2024-04-10 ENCOUNTER — Ambulatory Visit: Admitting: Family Medicine

## 2024-04-10 VITALS — BP 112/80 | HR 75 | Ht 69.5 in | Wt 337.0 lb

## 2024-04-10 DIAGNOSIS — Z6841 Body Mass Index (BMI) 40.0 and over, adult: Secondary | ICD-10-CM

## 2024-04-10 DIAGNOSIS — M5416 Radiculopathy, lumbar region: Secondary | ICD-10-CM | POA: Diagnosis not present

## 2024-04-10 DIAGNOSIS — E669 Obesity, unspecified: Secondary | ICD-10-CM

## 2024-04-10 MED ORDER — CELECOXIB 200 MG PO CAPS: 200.0000 mg | ORAL_CAPSULE | Freq: Two times a day (BID) | ORAL | 2 refills | Status: AC

## 2024-04-10 NOTE — Progress Notes (Unsigned)
"       ° °  I, Leotis Batter, CMA acting as a scribe for Artist Lloyd, MD.  Monique Bryan is a 34 y.o. female who presents to [facility] at [facility] today for cont'd lumbar radiculitis. Pt was last seen by Dr. Lloyd on 12/15/23 and a lumbar ESI was ordered and she was referred to neurosurgery. Refilled hydrocodone , tizanidine , and re-prescribed gabapentin .  Today, pt reports more relief from last ESI. The injection was a little more painful this time d/t inflammation. Continues to take IBU prn or Gabapentin  prn. Sx are manageable s/p ESI and with meds. Notes that IBU is causing some GI upset. Gets minimal relief with muscle relaxer's.    Neurosurgery consultation back in October was completed.  She will be a good surgical candidate in the future but fundamentally should lose a fair amount of weight to try to get her BMI closer to 40 or 35.  Since that visit in October she has lost approximately 50 pounds using Mounjaro.  She notes this has not helped her back very much.  She does note continued radicular pain.  Fortunately she denies any weakness or numbness.  Dx imaging: 11/26/23 L-spine MRI 10/02/23 L-spine XR  Pertinent review of systems: No fevers or chills  Relevant historical information: Hypertension and obesity.   Exam:  BP 112/80   Pulse 75   Ht 5' 9.5 (1.765 m)   Wt (!) 337 lb (152.9 kg)   SpO2 97%   BMI 49.05 kg/m  General: Well Developed, well nourished, and in no acute distress.   MSK: L-spine decreased lumbar motion.    Lab and Radiology Results No results found for this or any previous visit (from the past 72 hours). No results found.     Assessment and Plan: 34 y.o. female with lumbar radiculopathy.  Her pain is marginally managed with NSAIDs gabapentin  and intermittent epidural steroid injections.  Will switch from ibuprofen  to Celebrex  as that should be a little more gentler on her stomach.  However frequent NSAIDs are not a good long-term  strategy.  Happy to reorder epidural steroid injection when needed in the future.  Anticipate this will probably be around March. Ultimately she will require surgery and to have surgery more safely weight loss is important.  We talked a lot about weight loss strategies.    Will refer to general surgery to have a bariatric surgery discussion.  I think that would be a really good option for her.   PDMP not reviewed this encounter. Orders Placed This Encounter  Procedures   Amb Referral to Bariatric Surgery    Referral Priority:   Routine    Referral Type:   Consultation    Number of Visits Requested:   1   Meds ordered this encounter  Medications   celecoxib  (CELEBREX ) 200 MG capsule    Sig: Take 1 capsule (200 mg total) by mouth 2 (two) times daily.    Dispense:  60 capsule    Refill:  2     Discussed warning signs or symptoms. Please see discharge instructions. Patient expresses understanding.   The above documentation has been reviewed and is accurate and complete Artist Lloyd, M.D.   "

## 2024-04-10 NOTE — Patient Instructions (Addendum)
 Thank you for coming in today.   Prescription sent in for Celebrex  200 mg to take twice daily.   Referral placed to [facility] for bariatric surgery consult.   Let me know if you'd like a second surgical opinion.   Let me know if/when you'd like to try another back injection.

## 2024-04-11 DIAGNOSIS — M5416 Radiculopathy, lumbar region: Secondary | ICD-10-CM | POA: Insufficient documentation

## 2024-04-19 NOTE — Progress Notes (Unsigned)
 "  My Chart Video Visit- Progress Note: Referring Physician:  Latisha Medford, MD [address], [address] [place],  [place] [address]  Primary Physician:  Latisha Medford, MD  This visit was performed via MyChart/video.   Patient location: home Provider location: working from home  I spent a total of *** minutes non-face-to-face activities for this visit on the date of this encounter including review of current clinical condition and response to treatment.    Patient has given verbal consent to this MyChart video visit and we reviewed the limitations of a MyChart video visit. Patient wishes to proceed.    Chief Complaint:  follow up  History of Present Illness: Monique Bryan is a 34 y.o. female has a history of  HTN, PCOS, depression, obesity.   Last seen by me on 12/26/23 for LBP with left posterior leg pain to her foot. 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   Weight loss was discussed at her last visit- she's lost 50 lbs since October with Mounjaro. Dr. Joane referred her to bariatric surgery at her last visit on 04/10/24. He also stopped her motrin  and gave her celebrex .   She had left L5-S1 IL ESI on 01/01/24 and 03/22/24 with IR.   Mychart visit scheduled for follow up.          Had similar symptoms 3 years ago that improved with ESIs.    Now with 2 months of more constant LBP with constant left posterior leg pain to her foot. No right leg pain. She has numbness and tingling in her left leg. Pain is worse with laying and sitting down. Left leg pain > LBP. Some relief with medications and walking. Pain is worse at night and wakes her up.    She did not have any relief with last ESI in July.    Dr. Joane has her on hydrocodone  and gabapentin .    Tobacco use: Does not smoke.    Bowel/Bladder Dysfunction: none   Conservative measures:  Physical therapy:  none Multimodal medical therapy including  regular antiinflammatories: tizanidine , gabapentin , prednisone , tramadol , hydrocodone -acetaminophen  Injections: 03/22/24  Left L5-S1 IL 01/01/24  Left L5-S1 IL  10/18/2023 Left S1 10/12/2021 Left S1 06/01/2021 Left S1    Past Surgery: no spine surgery   Saliah DOROTHA Crimes has no symptoms of cervical myelopathy.   The symptoms are causing a significant impact on the patient's life.    Exam: General: Patient is well developed, well nourished, calm, collected, and in no apparent distress. Attention to examination is appropriate.  Respiratory: Patient is breathing without any difficulty.    Awake, alert, oriented to person, place, and time.  Speech is clear and fluent. Fund of knowledge is appropriate.   ***    Imaging: none  Assessment and Plan: Ms. Peaden had similar symptoms 3 years ago that improved with ESIs.    Now with 2 months of more constant LBP with constant left posterior leg pain to her foot. No right leg pain. She has numbness and tingling in her left leg. Left leg pain > LBP.     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    Treatment options discussed with patient and following plan made:    - Agree with getting lumbar ESI next week. Hopefully she will see improvement.  - Recommend PT for lumbar spine. She  declines for now. Wants to revisit after ESI.  - Discussed that prior to any surgery discussion that she would need to do at least 6 weeks of PT and lose weight. Would want her BMI closer to 35-40. This would be weight goal of 240-270 lbs.  - Discussed increased risk of surgical complications such as wound healing, infection, medical complications (like pneumonia), and also at risk of pain not improving after surgery with a higher BMI.  - Will message her in 2 weeks after her ESI to check on her progress and regroup.  - Red flag symptoms such as new weakness and bowel/bladder issues reviewed.   Glade Boys PA-C Neurosurgery "

## 2024-04-22 ENCOUNTER — Telehealth: Admitting: Orthopedic Surgery

## 2024-04-25 NOTE — Progress Notes (Unsigned)
 "  My Chart Video Visit- Progress Note: Referring Physician:  Latisha Medford, MD [address], [address] [employer],  [place] [address]  Primary Physician:  Latisha Medford, MD  This visit was performed via MyChart/video.   Patient location: home Provider location: working from home  I spent a total of *** minutes non-face-to-face activities for this visit on the date of this encounter including review of current clinical condition and response to treatment.    Patient has given verbal consent to this MyChart video visit and we reviewed the limitations of a MyChart video visit. Patient wishes to proceed.    Chief Complaint:  follow up  History of Present Illness: Monique Bryan is a 34 y.o. female has a history of  HTN, PCOS, depression, obesity.   Last seen by me on 12/26/23 for LBP with left posterior leg pain to her foot. 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   Weight loss was discussed at her last visit- she's lost 50 lbs since October with Mounjaro. Dr. Joane referred her to bariatric surgery at her last visit on 04/10/24. He also stopped her motrin  and gave her celebrex .   She had left L5-S1 IL ESI on 01/01/24 and 03/22/24 with IR.   Mychart visit scheduled for follow up.          Had similar symptoms 3 years ago that improved with ESIs.    Now with 2 months of more constant LBP with constant left posterior leg pain to her foot. No right leg pain. She has numbness and tingling in her left leg. Pain is worse with laying and sitting down. Left leg pain > LBP. Some relief with medications and walking. Pain is worse at night and wakes her up.    She did not have any relief with last ESI in July.    Dr. Joane has her on hydrocodone  and gabapentin .    Tobacco use: Does not smoke.    Bowel/Bladder Dysfunction: none   Conservative measures:  Physical therapy:  none Multimodal medical therapy including  regular antiinflammatories: tizanidine , gabapentin , prednisone , tramadol , hydrocodone -acetaminophen  Injections: 03/22/24  Left L5-S1 IL 01/01/24  Left L5-S1 IL  10/18/2023 Left S1 10/12/2021 Left S1 06/01/2021 Left S1    Past Surgery: no spine surgery   [name] has no symptoms of cervical myelopathy.   The symptoms are causing a significant impact on the patient's life.    Exam: General: Patient is well developed, well nourished, calm, collected, and in no apparent distress. Attention to examination is appropriate.  Respiratory: Patient is breathing without any difficulty.    Awake, alert, oriented to person, place, and time.  Speech is clear and fluent. Fund of knowledge is appropriate.   ***    Imaging: none  Assessment and Plan: [name] had similar symptoms 3 years ago that improved with ESIs.    Now with 2 months of more constant LBP with constant left posterior leg pain to her foot. No right leg pain. She has numbness and tingling in her left leg. Left leg pain > LBP.     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    Treatment options discussed with patient and following plan made:    - Agree with getting lumbar ESI next week. Hopefully she will see improvement.  - Recommend PT for lumbar spine. She  declines for now. Wants to revisit after ESI.  - Discussed that prior to any surgery discussion that she would need to do at least 6 weeks of PT and lose weight. Would want her BMI closer to 35-40. This would be weight goal of 240-270 lbs.  - Discussed increased risk of surgical complications such as wound healing, infection, medical complications (like pneumonia), and also at risk of pain not improving after surgery with a higher BMI.  - Will message her in 2 weeks after her ESI to check on her progress and regroup.  - Red flag symptoms such as new weakness and bowel/bladder issues reviewed.   Glade Boys PA-C Neurosurgery "

## 2024-04-26 ENCOUNTER — Emergency Department (HOSPITAL_COMMUNITY)
Admission: EM | Admit: 2024-04-26 | Discharge: 2024-04-26 | Disposition: A | Discharge status: Home / Self Care | Source: Home / Self Care | Attending: Emergency Medicine | Admitting: Emergency Medicine

## 2024-04-26 ENCOUNTER — Emergency Department (HOSPITAL_COMMUNITY)

## 2024-04-26 DIAGNOSIS — R109 Unspecified abdominal pain: Secondary | ICD-10-CM

## 2024-04-26 DIAGNOSIS — K625 Hemorrhage of anus and rectum: Secondary | ICD-10-CM

## 2024-04-26 LAB — COMPREHENSIVE METABOLIC PANEL WITH GFR
ALT: 26 U/L (ref 0–44)
AST: 17 U/L (ref 15–41)
Albumin: 4.2 g/dL (ref 3.5–5.0)
Alkaline Phosphatase: 80 U/L (ref 38–126)
Anion gap: 10 (ref 5–15)
BUN: 9 mg/dL (ref 6–20)
CO2: 25 mmol/L (ref 22–32)
Calcium: 9.4 mg/dL (ref 8.9–10.3)
Chloride: 104 mmol/L (ref 98–111)
Creatinine, Ser: 0.59 mg/dL (ref 0.44–1.00)
GFR, Estimated: >60 mL/min
Glucose, Bld: 138 mg/dL — ABNORMAL HIGH (ref 70–99)
Potassium: 4.5 mmol/L (ref 3.5–5.1)
Sodium: 139 mmol/L (ref 135–145)
Total Bilirubin: 0.4 mg/dL (ref 0.0–1.2)
Total Protein: 7.6 g/dL (ref 6.5–8.1)

## 2024-04-26 LAB — CBC
HCT: 43.3 % (ref 36.0–46.0)
Hemoglobin: 14.3 g/dL (ref 12.0–15.0)
MCH: 30.4 pg (ref 26.0–34.0)
MCHC: 33 g/dL (ref 30.0–36.0)
MCV: 91.9 fL (ref 80.0–100.0)
Platelets: 322 10*3/uL (ref 150–400)
RBC: 4.71 MIL/uL (ref 3.87–5.11)
RDW: 13.9 % (ref 11.5–15.5)
WBC: 9.3 10*3/uL (ref 4.0–10.5)
nRBC: 0 % (ref 0.0–0.2)

## 2024-04-26 LAB — TYPE AND SCREEN
ABO/RH(D): O POS
Antibody Screen: NEGATIVE

## 2024-04-26 LAB — URINALYSIS, ROUTINE W REFLEX MICROSCOPIC
Bilirubin Urine: NEGATIVE
Glucose, UA: NEGATIVE mg/dL
Ketones, ur: NEGATIVE mg/dL
Leukocytes,Ua: NEGATIVE
Nitrite: NEGATIVE
Protein, ur: NEGATIVE mg/dL
Specific Gravity, Urine: 1.01 (ref 1.005–1.030)
pH: 5 (ref 5.0–8.0)

## 2024-04-26 LAB — LIPASE, BLOOD: Lipase: 15 U/L (ref 11–51)

## 2024-04-26 LAB — HCG, SERUM, QUALITATIVE: Preg, Serum: NEGATIVE

## 2024-04-26 LAB — ABO/RH: ABO/RH(D): O POS

## 2024-04-26 MED ORDER — IOHEXOL 300 MG/ML  SOLN
100.0000 mL | Freq: Once | INTRAMUSCULAR | Status: AC | PRN
  Administered 2024-04-26: 100 mL via INTRAVENOUS

## 2024-04-26 MED ORDER — MORPHINE SULFATE (PF) 2 MG/ML IV SOLN
2.0000 mg | Freq: Once | INTRAVENOUS | Status: AC
  Administered 2024-04-26: 2 mg via INTRAVENOUS
  Filled 2024-04-26: qty 1

## 2024-04-26 MED ORDER — ONDANSETRON HCL 4 MG/2ML IJ SOLN
4.0000 mg | Freq: Once | INTRAMUSCULAR | Status: AC
  Administered 2024-04-26: 4 mg via INTRAVENOUS
  Filled 2024-04-26: qty 2

## 2024-04-26 NOTE — ED Provider Notes (Cosign Needed)
 " [place] EMERGENCY DEPARTMENT AT [facility] Provider Note   CSN: [id number] Arrival date & time: 04/26/24  9065     Patient presents with: Abdominal Pain   Monique Bryan is a 34 y.o. female.   33 year old female presenting with abdominal cramping, blood/mucus in stool.  Patient reports that she ate at [facility], had a chicken sandwich and was feeling fine until later in the evening when she began to experience abdominal cramping, when she went to use the restroom she had 1 normal bowel movement but then subsequently had several episodes of bright red blood with mucus coming from her rectum, associated with chills and nausea, no vomiting.  Reports that abdominal cramping comes and goes, when it is at its worst it feels like her stomach is in a ball.  Denies fever.  Denies history of IBD.  No pain with defecation.  Patient reports 800 mg ibuprofen  use every morning/nightly for several months due to bulging disc in her back, switched to naproxen  yesterday; no other new medications to report, including antibiotics.  No known sick contacts.   Abdominal Pain      Prior to Admission medications  Medication Sig Start Date End Date Taking? Authorizing Provider  benzonatate  (TESSALON ) 100 MG capsule Take 1 capsule (100 mg total) by mouth every 8 (eight) hours. 03/16/24   Lorelle Aleck BROCKS, PA-C  celecoxib  (CELEBREX ) 200 MG capsule Take 1 capsule (200 mg total) by mouth 2 (two) times daily. 04/10/24   Joane Artist RAMAN, MD  gabapentin  (NEURONTIN ) 300 MG capsule Take 1-3 capsules (300-900 mg total) by mouth 3 (three) times daily as needed. 02/29/24   Corey, Evan S, MD  HYDROcodone -acetaminophen  (NORCO/VICODIN) 5-325 MG tablet Take 1 tablet by mouth every 6 (six) hours as needed. 03/22/24   Joane Artist RAMAN, MD  levonorgestrel  (MIRENA , 52 MG,) 20 MCG/DAY IUD Take by intrauterine route.    [provider]  methocarbamol  (ROBAXIN ) 500 MG tablet Take 1 tablet  (500 mg total) by mouth 2 (two) times daily. 03/22/24   Corey, Evan S, MD  MOUNJARO 2.5 MG/0.5ML Pen SMARTSIG:2.5 Milligram(s) SUB-Q Once a Week    [provider]  ondansetron  (ZOFRAN -ODT) 4 MG disintegrating tablet Take 1 tablet (4 mg total) by mouth every 8 (eight) hours as needed. 03/16/24   Aberman, Caroline C, PA-C  tiZANidine  (ZANAFLEX ) 4 MG tablet TAKE 1 TABLET(4 MG) BY MOUTH EVERY 8 HOURS AS NEEDED 01/18/24   Corey, Evan S, MD  valACYclovir (VALTREX) 500 MG tablet Take 500 mg by mouth 2 (two) times daily.    [provider]    Allergies: Patient has no known allergies.    Review of Systems  Gastrointestinal:  Positive for abdominal pain.    Updated Vital Signs  Vitals:   04/26/24 0939 04/26/24 0941  BP:  (!) 162/97  Pulse: 74 66  Resp: 18   Temp: 98 F (36.7 C)   TempSrc: Oral   SpO2: 100%      Physical Exam Vitals and nursing note reviewed.  Constitutional:      General: She is not in acute distress.    Appearance: She is well-developed. She is not ill-appearing or toxic-appearing.  HENT:     Head: Normocephalic and atraumatic.  Eyes:     Extraocular Movements: Extraocular movements intact.     Pupils: Pupils are equal, round, and reactive to light.  Cardiovascular:     Rate and Rhythm: Normal rate.  Pulmonary:  Effort: Pulmonary effort is normal.  Abdominal:     Palpations: Abdomen is soft.     Tenderness: There is abdominal tenderness (generalized, worse in suprapubic region). There is no guarding.  Musculoskeletal:     Cervical back: Normal range of motion.     Comments: Moves all extremities spontaneously without difficulty  Skin:    General: Skin is warm and dry.  Neurological:     General: No focal deficit present.     Mental Status: She is alert and oriented to person, place, and time.     (all labs ordered are listed, but only abnormal results are displayed) Labs Reviewed  COMPREHENSIVE METABOLIC PANEL WITH GFR - Abnormal;  Notable for the following components:      Result Value   Glucose, Bld 138 (*)    All other components within normal limits  URINALYSIS, ROUTINE W REFLEX MICROSCOPIC - Abnormal; Notable for the following components:   Hgb urine dipstick SMALL (*)    Bacteria, UA RARE (*)    All other components within normal limits  LIPASE, BLOOD  CBC  HCG, SERUM, QUALITATIVE  TYPE AND SCREEN  ABO/RH    EKG: None  Radiology: CT ABDOMEN PELVIS W CONTRAST Result Date: 04/26/2024 CLINICAL DATA:  abdominal cramping, blood/mucus in stool. EXAM: CT ABDOMEN AND PELVIS WITH CONTRAST TECHNIQUE: Multidetector CT imaging of the abdomen and pelvis was performed using the standard protocol following bolus administration of intravenous contrast. RADIATION DOSE REDUCTION: This exam was performed according to the departmental dose-optimization program which includes automated exposure control, adjustment of the mA and/or kV according to patient size and/or use of iterative reconstruction technique. CONTRAST:  OMNIPAQUE  IOHEXOL  300 MG/ML  SOLN COMPARISON:  CT scan abdomen and pelvis from 09/02/2016. FINDINGS: Lower chest: The lung bases are clear. No pleural effusion. The heart is normal in size. No pericardial effusion. Hepatobiliary: The liver is normal in size. Non-cirrhotic configuration. No suspicious mass. These is mild diffuse hepatic steatosis. No intrahepatic or extrahepatic bile duct dilation. No calcified gallstones. Normal gallbladder wall thickness. No pericholecystic inflammatory changes. Pancreas: Unremarkable. No pancreatic ductal dilatation or surrounding inflammatory changes. Spleen: Within normal limits. No focal lesion. Adrenals/Urinary Tract: Adrenal glands are unremarkable. No suspicious renal mass. No hydronephrosis. No renal or ureteric calculi. Urinary bladder is under distended, precluding optimal assessment. However, no large mass or stones identified. No perivesical fat stranding. Stomach/Bowel:  No disproportionate dilation of the small or large bowel loops. No evidence of abnormal bowel wall thickening or inflammatory changes. The appendix is unremarkable. Vascular/Lymphatic: No ascites or pneumoperitoneum. No abdominal or pelvic lymphadenopathy, by size criteria. No aneurysmal dilation of the major abdominal arteries. Reproductive: Normal-size anteverted uterus noted. There is a T-shaped intrauterine device, which appears in satisfactory position. Bilateral ovaries are within normal limits. Other: There is a tiny fat containing umbilical hernia. The soft tissues and abdominal wall are otherwise unremarkable. Musculoskeletal: No suspicious osseous lesions. IMPRESSION: 1. No acute inflammatory process identified within the abdomen or pelvis. 2. Essentially unremarkable exam. Electronically Signed   By: Ree Molt M.D.   On: 04/26/2024 13:54     Procedures   Medications Ordered in the ED  morphine  (PF) 2 MG/ML injection 2 mg (2 mg Intravenous Given 04/26/24 1310)  ondansetron  (ZOFRAN ) injection 4 mg (4 mg Intravenous Given 04/26/24 1310)  iohexol  (OMNIPAQUE ) 300 MG/ML solution 100 mL (100 mLs Intravenous Contrast Given 04/26/24 1315)  Medical Decision Making This patient presents to the ED for concern of abdominal pain with bright red blood/mucus per rectum, this involves an extensive number of treatment options, and is a complaint that carries with it a high risk of complications and morbidity.  The differential diagnosis includes diverticulitis, colitis, other inflammatory/infectious GI pathology, appendicitis, IBD   Co morbidities that complicate the patient evaluation  Chronic back pain   Additional history obtained:  Additional history obtained from record review External records from outside source obtained and reviewed including prior ED note   Lab Tests:  I Ordered, and personally interpreted labs.  The pertinent results include: CBC  within normal limits, no leukocytosis.  CMP unremarkable.  Lipase within normal limits, 15.  Serum hCG negative. Urinalysis with small RBCs and rare bacteria, low suspicion for urinary tract infection given absence of leukocytes/nitrites and without urinary symptoms.   Imaging Studies ordered:  I ordered imaging studies including CT abdomen/pelvis   I independently visualized and interpreted imaging which showed 1. No acute inflammatory process identified within the abdomen or pelvis. 2. Essentially unremarkable exam.  I agree with the radiologist interpretation   Cardiac Monitoring: / EKG:  The patient was maintained on a cardiac monitor.  I personally viewed and interpreted the cardiac monitored which showed an underlying rhythm of: NSR   Consultations Obtained:  I requested consultation with the gastroenterology,  and discussed lab and imaging findings as well as pertinent plan - they recommend: I spoke with Dr. Rosalie, given that the patient does not have a fever he does not feel that colitis is the most likely diagnosis and therefore does not feel that an administration of antibiotics is necessary at this time, he is recommending liquid diet for the remainder of the day with plan to advance her diet gradually tomorrow with plan to avoid fried/spicy foods, recommends the patient contact their office Monday, if she has had improvement in her symptoms she can be seen later in the week however if her symptoms persist on Monday she will be seen that day. Hold NSAIDs for the time being.    Problem List / ED Course / Critical interventions / Medication management  I ordered medication including morphine   for pain, Zofran  for nausea  Reevaluation of the patient after these medicines showed that the patient improved I have reviewed the patients home medicines and have made adjustments as needed   Social Determinants of Health:  Tobacco use   Test / Admission - Considered:  Physical  exam is notable as above, patient does have generalized abdominal tenderness on exam, unable to form rectal exam as patient is in a hallway bed but she is able to show me a picture on her phone of red-tinged mucus that has been coming from her rectum with straining. Workup is largely unremarkable, patient is mildly hypertensive but is without fever or tachycardia, she is well-appearing and in no acute distress, CBC is without leukocytosis. CT as above is largely unremarkable.  I called and spoke with Dr. Rosalie with gastroenterology, see above for his detailed recommendations. I discussed this plan with the patient who is in agreement, she understands that she should hold off on use of NSAIDs for now and should continue to use Tylenol /muscle relaxer/gabapentin  for chronic back pain instead.  Patient should continue with a liquid diet this evening with plan to advance her diet gently tomorrow, patient understands she needs to contact Dr. Janett office on Monday to schedule follow-up. Return precautions discussed, patient is  in agreement this plan is appropriate for discharge at this time.    Amount and/or Complexity of Data Reviewed Labs: ordered. Radiology: ordered.  Risk Prescription drug management.        Final diagnoses:  Rectal bleeding  Abdominal pain, unspecified abdominal location    ED Discharge Orders     None          Glendia Rocky SAILOR, [place] 04/26/24 1525  "

## 2024-04-26 NOTE — Discharge Instructions (Addendum)
 I have provided you with the contact information for Dr. Rosalie who is a gastroenterologist, please contact his office on Monday to schedule follow-up.  Please continue a liquid diet this evening, you may advance your diet slowly tomorrow but please avoid spicy/fried foods.  Do not use any NSAIDs like ibuprofen  or naproxen  for the time being, continue to use Tylenol  as needed for pain.  Return to the emergency department if your symptoms worsen or if you develop a fever.

## 2024-04-26 NOTE — ED Notes (Signed)
ED PA at [facility] 

## 2024-04-26 NOTE — ED Notes (Signed)
 Endorses taking a lot of ibuprofen  for sciatica back pain.

## 2024-04-26 NOTE — ED Triage Notes (Signed)
 Pt with abd pain x last night with abd cramping , pt reports diarrhea and blood in stool , pt reports each episode she got chills, pt reports blood in stool and when she wipes. Pt denies GI hx.

## 2024-05-03 ENCOUNTER — Telehealth: Admitting: Orthopedic Surgery
